# Patient Record
Sex: Female | Born: 1945 | Race: White | Hispanic: No | Smoking: Never smoker
Health system: Southern US, Community
[De-identification: ages and names within clinical notes are randomized; demographics above are authoritative.]

## PROBLEM LIST (undated history)

## (undated) DIAGNOSIS — J449 Chronic obstructive pulmonary disease, unspecified: Secondary | ICD-10-CM

## (undated) DIAGNOSIS — F419 Anxiety disorder, unspecified: Secondary | ICD-10-CM

## (undated) DIAGNOSIS — F32A Depression, unspecified: Secondary | ICD-10-CM

## (undated) DIAGNOSIS — I509 Heart failure, unspecified: Secondary | ICD-10-CM

## (undated) DIAGNOSIS — E669 Obesity, unspecified: Secondary | ICD-10-CM

## (undated) DIAGNOSIS — R269 Unspecified abnormalities of gait and mobility: Principal | ICD-10-CM

## (undated) DIAGNOSIS — F329 Major depressive disorder, single episode, unspecified: Secondary | ICD-10-CM

## (undated) DIAGNOSIS — S82891A Other fracture of right lower leg, initial encounter for closed fracture: Secondary | ICD-10-CM

## (undated) DIAGNOSIS — G629 Polyneuropathy, unspecified: Secondary | ICD-10-CM

## (undated) DIAGNOSIS — G63 Polyneuropathy in diseases classified elsewhere: Secondary | ICD-10-CM

## (undated) DIAGNOSIS — M1990 Unspecified osteoarthritis, unspecified site: Secondary | ICD-10-CM

## (undated) DIAGNOSIS — I10 Essential (primary) hypertension: Secondary | ICD-10-CM

## (undated) DIAGNOSIS — E785 Hyperlipidemia, unspecified: Secondary | ICD-10-CM

## (undated) DIAGNOSIS — G4730 Sleep apnea, unspecified: Secondary | ICD-10-CM

## (undated) DIAGNOSIS — S32010A Wedge compression fracture of first lumbar vertebra, initial encounter for closed fracture: Secondary | ICD-10-CM

## (undated) DIAGNOSIS — H269 Unspecified cataract: Secondary | ICD-10-CM

## (undated) DIAGNOSIS — K219 Gastro-esophageal reflux disease without esophagitis: Secondary | ICD-10-CM

## (undated) DIAGNOSIS — I739 Peripheral vascular disease, unspecified: Secondary | ICD-10-CM

## (undated) HISTORY — PX: HAND / FINGER LESION EXCISION: SUR531

## (undated) HISTORY — DX: Chronic obstructive pulmonary disease, unspecified: J44.9

## (undated) HISTORY — DX: Heart failure, unspecified: I50.9

## (undated) HISTORY — PX: ABDOMINAL HYSTERECTOMY: SHX81

## (undated) HISTORY — PX: BACK SURGERY: SHX140

## (undated) HISTORY — DX: Other fracture of right lower leg, initial encounter for closed fracture: S82.891A

## (undated) HISTORY — DX: Wedge compression fracture of first lumbar vertebra, initial encounter for closed fracture: S32.010A

## (undated) HISTORY — PX: UMBILICAL HERNIA REPAIR: SHX196

## (undated) HISTORY — DX: Sleep apnea, unspecified: G47.30

## (undated) HISTORY — DX: Unspecified abnormalities of gait and mobility: R26.9

## (undated) HISTORY — DX: Obesity, unspecified: E66.9

## (undated) HISTORY — DX: Polyneuropathy in diseases classified elsewhere: G63

## (undated) HISTORY — PX: TONSILLECTOMY: SUR1361

## (undated) HISTORY — PX: EXTENSOR TENDON OF FOREARM / WRIST REPAIR: SHX1547

## (undated) HISTORY — DX: Polyneuropathy, unspecified: G62.9

## (undated) HISTORY — DX: Hyperlipidemia, unspecified: E78.5

## (undated) HISTORY — PX: BLADDER SUSPENSION: SHX72

## (undated) HISTORY — PX: OTHER SURGICAL HISTORY: SHX169

## (undated) HISTORY — PX: CHOLECYSTECTOMY: SHX55

## (undated) HISTORY — PX: KYPHOPLASTY: SHX5884

## (undated) HISTORY — DX: Unspecified osteoarthritis, unspecified site: M19.90

## (undated) HISTORY — PX: CATARACT EXTRACTION: SUR2

---

## 2000-03-18 ENCOUNTER — Encounter: Payer: Self-pay | Admitting: Emergency Medicine

## 2000-03-18 ENCOUNTER — Emergency Department (HOSPITAL_COMMUNITY): Admission: EM | Admit: 2000-03-18 | Discharge: 2000-03-18 | Payer: Self-pay | Admitting: Emergency Medicine

## 2002-01-17 ENCOUNTER — Encounter: Payer: Self-pay | Admitting: *Deleted

## 2002-01-21 ENCOUNTER — Ambulatory Visit (HOSPITAL_COMMUNITY): Admission: RE | Admit: 2002-01-21 | Discharge: 2002-01-22 | Payer: Self-pay | Admitting: *Deleted

## 2002-01-21 ENCOUNTER — Encounter: Payer: Self-pay | Admitting: *Deleted

## 2002-01-21 ENCOUNTER — Encounter (INDEPENDENT_AMBULATORY_CARE_PROVIDER_SITE_OTHER): Payer: Self-pay | Admitting: *Deleted

## 2002-12-20 ENCOUNTER — Encounter: Admission: RE | Admit: 2002-12-20 | Discharge: 2002-12-20 | Payer: Self-pay | Admitting: Surgery

## 2002-12-20 ENCOUNTER — Encounter: Payer: Self-pay | Admitting: Surgery

## 2002-12-23 ENCOUNTER — Encounter (INDEPENDENT_AMBULATORY_CARE_PROVIDER_SITE_OTHER): Payer: Self-pay | Admitting: *Deleted

## 2002-12-23 ENCOUNTER — Ambulatory Visit (HOSPITAL_COMMUNITY): Admission: RE | Admit: 2002-12-23 | Discharge: 2002-12-23 | Payer: Self-pay | Admitting: Surgery

## 2002-12-23 ENCOUNTER — Ambulatory Visit (HOSPITAL_BASED_OUTPATIENT_CLINIC_OR_DEPARTMENT_OTHER): Admission: RE | Admit: 2002-12-23 | Discharge: 2002-12-23 | Payer: Self-pay | Admitting: Surgery

## 2006-01-03 ENCOUNTER — Encounter: Admission: RE | Admit: 2006-01-03 | Discharge: 2006-01-20 | Payer: Self-pay | Admitting: Neurology

## 2009-09-30 ENCOUNTER — Ambulatory Visit: Payer: Self-pay | Admitting: Surgery

## 2009-09-30 ENCOUNTER — Ambulatory Visit (HOSPITAL_COMMUNITY): Admission: RE | Admit: 2009-09-30 | Discharge: 2009-09-30 | Payer: Self-pay | Admitting: Internal Medicine

## 2010-01-29 ENCOUNTER — Ambulatory Visit: Payer: Self-pay | Admitting: Pulmonary Disease

## 2010-01-30 ENCOUNTER — Inpatient Hospital Stay (HOSPITAL_COMMUNITY): Admission: EM | Admit: 2010-01-30 | Discharge: 2010-02-02 | Payer: Self-pay | Admitting: Emergency Medicine

## 2010-02-01 DIAGNOSIS — F39 Unspecified mood [affective] disorder: Secondary | ICD-10-CM

## 2010-03-02 ENCOUNTER — Ambulatory Visit
Admission: RE | Admit: 2010-03-02 | Discharge: 2010-03-02 | Payer: Self-pay | Source: Home / Self Care | Attending: Orthopedic Surgery | Admitting: Orthopedic Surgery

## 2010-04-08 ENCOUNTER — Inpatient Hospital Stay (HOSPITAL_COMMUNITY)
Admission: EM | Admit: 2010-04-08 | Discharge: 2010-04-12 | Payer: Self-pay | Source: Home / Self Care | Attending: Orthopedic Surgery | Admitting: Orthopedic Surgery

## 2010-04-09 NOTE — Op Note (Signed)
[name], [name] NO.:  [id number]  MEDICAL RECORD NO.:  [id number]          PATIENT TYPE:  INP  LOCATION:  5022                         FACILITY:  [facility]  PHYSICIAN:  Madelynn Done, MD  DATE OF BIRTH:  07-06-1945  DATE OF PROCEDURE:  04/08/2010 DATE OF DISCHARGE:                              OPERATIVE REPORT   PREOPERATIVE DIAGNOSES: 1. Right forearm cat bite. 2. Right forearm abscess. 3. Right forearm extensor tenosynovitis.  POSTOPERATIVE DIAGNOSES: 1. Right forearm cat bite. 2. Right forearm abscess. 3. Right forearm extensor tenosynovitis.  ATTENDING PHYSICIAN:  Madelynn Done, MD, who scrubbed and present for the entire procedure.  ASSISTANT SURGEON:  None.  ANESTHESIA:  General via LMA.  TOURNIQUET TIME:  Zero minutes.  INTRAOPERATIVE CULTURES:  Aerobic and anaerobic cultures.  SURGICAL PROCEDURES: 1. Right forearm drainage of deep abscess and debridement of nonviable     tissue, skin muscle, fat and fascia. 2. Right forearm extensor fourth dorsal compartment tenosynovectomy. 3. Right forearm fasciotomy.  SURGICAL INDICATIONS:  [name] is a 65 year old right-hand-dominant female who was involved in a cat sustaining multiple bites over the dorsal aspect and volar aspect of her forearm, greater than 72 hours prior to her presentation in my office.  She was then seen at [facility] Emergency Room on April 06, 2010, where she underwent simple Betadine cleansing on the skin and placement of oral antibiotics per her account.  She presented to my office today with a largely swollen hand and forearm with obvious sign of infection.  It was recommended that she undergo the above procedure.  Risks, benefits and alternatives were discussed in detail with the patient and a signed informed consent was obtained.  Risks include but not limited to bleeding, infection, damage nearby nerves, arteries, tendons, loss of motion of elbow,  wrist and digits and need for further surgical intervention.  DESCRIPTION OF PROCEDURE:  The patient was appropriately identified in the preop holding area and a mark with a permanent marker was made on the right forearm to indicate correct operative site.  The patient was brought back to the operating room, placed supine on anesthesia room table where general anesthesia was administered.  The patient tolerated this well.  A well-padded tourniquet was then placed on the right brachium and sealed with a 1000 drape.  Right upper extremity was then prepped and draped in normal sterile fashion.  Time-out was called, correct site was identified.  Procedure was then begun.  Attention was then turned to the right forearm where the patient did have multiple puncture wounds extending on the dorsal aspect of the forearm.  A curvilinear incision was then made to corporate the multiple puncture wounds.  Dissection was then carried down through the skin and subcutaneous tissues where a gross purulence was countered.  Further dissection was then carried down to the fascial layer what there appeared to be fluid and reactive fluid within the fourth dorsal compartment.  The blunt dissection was carried down through the subcutaneous tissues.  Hemostasis was obtained with bipolar cautery. Following this, the forearm fasciotomy was then carried out  opening up the fourth dorsal compartment and also opening up the third and second dorsal compartment proximally.  After forearm fasciotomy, the fourth dorsal compartment tendons were inspected and tenosynovectomy was carried out of the inflammatory and reactive tissue with a fourth dorsal compartment.  Following tenosynovectomy, the wound was then thoroughly irrigated.  Nonviable tissue of the skin and subcutaneous tissues were then carried out over the dorsal aspect of the forearm.  Several small stab incisions were made, one on the volar and one on the dorsal  aspect of the hand to allow for the fluid to run through the entire dorsal aspect in the affected area.  Following this, a pulsatile lavage was then used over the wound.  After thorough irrigation, debridement was then carried out.  Following this, the wound was then reapproximated with 3-0 Prolene sutures and closed over small vessel loops.  Adaptic dressing and sterile compressive bandage were then applied.  The patient was then placed in a well-padded volar splint.  She was taken to recovery room in good condition.  POSTOPERATIVE PLAN:  The patient admitted for IV antibiotics and pain control.  She will be likely require repeat I and D within the next 48- 72 hours.  Await the wound cultures and follow her closely.     Madelynn Done, MD     FWO/MEDQ  D:  04/08/2010  T:  04/09/2010  Job:  [id number]  Electronically Signed by Bradly Bienenstock IV MD on 04/09/2010 11:59:37 AM

## 2010-04-12 LAB — MRSA PCR SCREENING: MRSA by PCR: NEGATIVE

## 2010-04-13 LAB — WOUND CULTURE: Gram Stain: NONE SEEN

## 2010-05-05 NOTE — H&P (Signed)
  [name], GUNDERMAN NO.:  [id number]  MEDICAL RECORD NO.:  [id number]          PATIENT TYPE:  INP  LOCATION:  5022                         FACILITY:  [facility]  PHYSICIAN:  Madelynn Done, MD  DATE OF BIRTH:  1945-12-19  DATE OF ADMISSION:  04/08/2010 DATE OF DISCHARGE:                             HISTORY & PHYSICAL   CHIEF COMPLAINT:  Right forearm pain.  BRIEF HISTORY:  [name] is a 64-year right-hand-dominant female who sustained multiple cat bites greater than 72 hours prior to presenting to my office on April 08, 2010.  The patient was seen at [facility] Emergency Department, underwent cleansing of the skin with Betadine and oral antibiotic placement.  She comes in today with a worsening infection in her right arm.  PAST MEDICAL HISTORY:  Signed and reviewed on the medical intake sheet in my office.  FAMILY HISTORY:  Signed and reviewed on the medical intake sheet in my office.  SOCIAL HISTORY:  Signed and reviewed on the medical intake sheet in my office.  ALLERGIES:  Signed and reviewed on the medical intake sheet in my office.  MEDICATIONS:  Signed and reviewed on the medical intake sheet in my office.  REVIEW OF SYSTEMS:  Signed and reviewed on the medical intake sheet in my office.  PHYSICAL EXAMINATION:  Documented on my office notes and please see my office notes for full details on the examination.  IMPRESSION:  Right forearm abscess and cat bite.  PLAN:  Today, the findings were reviewed with the patient and the patient will be admitted for operative intervention to undergo decompression of the forearm abscess.  Please see my office notes for the details.  She will be admitted for IV antibiotics and pain control. Continue to follow her closely.     Madelynn Done, MD     FWO/MEDQ  D:  04/09/2010  T:  04/10/2010  Job:  [id number]  Electronically Signed by Bradly Bienenstock IV MD on 05/05/2010 02:19:21 PM

## 2010-05-11 NOTE — H&P (Signed)
[name], [name]                ACCOUNT NO.:  [id number]  MEDICAL RECORD NO.:  [id number]          PATIENT TYPE:  INP  LOCATION:  1823                         FACILITY:  [facility]  PHYSICIAN:  Lucile Crater, MD         DATE OF BIRTH:  [birth year]  DATE OF ADMISSION:  01/29/2010 DATE OF DISCHARGE:                             HISTORY & PHYSICAL   CHIEF COMPLAINT:  Increasing lethargy.  HISTORY OF PRESENT ILLNESS:  The patient is a 65 year old female who has a history of chronic low back pain.  She is on hydrocodone and morphine for her back pain and she took a lot of medications to help with the pain.  This happened earlier this afternoon.  She does not remember how many pills she took.  There was no suicidal intention.  The reason was for pain relief.  The patient is very drowsy in the ER and could not give much of the history.  The patient is arousable to verbal commands, opens her eyes.  Moves all extremities.  She had an ABG done.  It revealed hypercarbia 66.  REVIEW OF SYSTEMS:  Could not be obtained secondary to mental status.  PAST MEDICAL HISTORY:  Unknown.  ALLERGIES:  LATEX.  HOME MEDICATIONS:  This is not a complete list. 1. Morphine. 2. Vicodin.  SOCIAL HISTORY:  Not obtainable.  FAMILY HISTORY:  Not obtainable.  PHYSICAL EXAM:  VITALS:  O2 saturation 93% on 2 liters.  Respiratory rate 16.  Pulse rate 95, blood pressure 115/69, temperature 99. GENERAL APPEARANCE:  Not in acute distress.  The patient is extremely lethargic, arousable to name calling. NECK:  No JVD lymphedema carotid bruit. CVS: Regular rhythm is normal.  No murmurs, rubs or gallops. LUNGS:  Clear to auscultation bilaterally. ABDOMEN:  Benign. EXTREMITIES:  No clubbing, cyanosis, edema. NEUROLOGIC:  Exam grossly nonfocal.  Accept increased drowsiness.  LABS AND STUDIES: 1. ABG:  PH 7.206, pCO2 of 66, pO2 of 72, bicarb 24. 2. Serum salicylate level less than 4. 3. Serum acetaminophen level less  than 10. 4. Sodium 139, potassium 4.6, chloride 104, bicarb 24, BUN 20,     creatinine 2.44, blood glucose 121. 5. Alcohol level less than 5. 6. Chest x-ray low lung volumes with vascular congestion. 7. WBC 16,800, hemoglobin 12.6, hematocrit 39, platelets 156,000. 8. Urine drug screen positive for opiates. 9. Urinalysis within normal limits.  ASSESSMENT/PLAN: 1. Altered mental status most likely secondary to excessive intake of     narcotic medications.  The patient reportedly did not have any     ideation to harm herself.  The intention was to relieve her pain.     We will obtain a CT scan of the head to evaluate for any acute     changes.  Serum alcohol, aminophylline, and aspirin levels are     within range.  She does have hypercarbia on ABG.  Most likely this     is secondary to narcotic abuse.  Will have her on BiPAP.  Will     closely monitor her respiratory status. 2. Acute renal failure.  Will aggressively  hydrate with IV fluids.  We     will repeat the BMP. 3. Leukocytosis, unclear etiology.  She is afebrile.  Urine is clear.     The chest x-ray is negative for pneumonia.  We will watch.  DISPOSITION:  Will admit the patient to step-down unit for closer monitoring.     Lucile Crater, MD     TA/MEDQ  D:  01/30/2010  T:  01/30/2010  Job:  [id number]  Electronically Signed by Lucile Crater MD on 05/11/2010 04:03:09 PM

## 2010-06-01 LAB — HEPATIC FUNCTION PANEL
ALT: 21 U/L (ref 0–35)
AST: 32 U/L (ref 0–37)
AST: 39 U/L — ABNORMAL HIGH (ref 0–37)
Albumin: 3.7 g/dL (ref 3.5–5.2)
Albumin: 4.4 g/dL (ref 3.5–5.2)
Total Bilirubin: 0.9 mg/dL (ref 0.3–1.2)
Total Bilirubin: 1 mg/dL (ref 0.3–1.2)
Total Protein: 7.5 g/dL (ref 6.0–8.3)

## 2010-06-01 LAB — STREP PNEUMONIAE URINARY ANTIGEN: Strep Pneumo Urinary Antigen: NEGATIVE

## 2010-06-01 LAB — POCT I-STAT 4, (NA,K, GLUC, HGB,HCT)
HCT: 36 % (ref 36.0–46.0)
Hemoglobin: 12.2 g/dL (ref 12.0–15.0)
Potassium: 3.6 mEq/L (ref 3.5–5.1)
Sodium: 140 mEq/L (ref 135–145)

## 2010-06-01 LAB — DIFFERENTIAL
Basophils Absolute: 0 10*3/uL (ref 0.0–0.1)
Basophils Absolute: 0 10*3/uL (ref 0.0–0.1)
Basophils Relative: 0 % (ref 0–1)
Eosinophils Absolute: 0 10*3/uL (ref 0.0–0.7)
Eosinophils Absolute: 0 10*3/uL (ref 0.0–0.7)
Eosinophils Relative: 0 % (ref 0–5)
Monocytes Absolute: 0.7 10*3/uL (ref 0.1–1.0)
Neutro Abs: 14.6 10*3/uL — ABNORMAL HIGH (ref 1.7–7.7)
Neutrophils Relative %: 87 % — ABNORMAL HIGH (ref 43–77)

## 2010-06-01 LAB — BASIC METABOLIC PANEL
BUN: 10 mg/dL (ref 6–23)
BUN: 22 mg/dL (ref 6–23)
BUN: 31 mg/dL — ABNORMAL HIGH (ref 6–23)
BUN: 7 mg/dL (ref 6–23)
CO2: 24 mEq/L (ref 19–32)
CO2: 25 mEq/L (ref 19–32)
Calcium: 8.2 mg/dL — ABNORMAL LOW (ref 8.4–10.5)
Calcium: 8.3 mg/dL — ABNORMAL LOW (ref 8.4–10.5)
Calcium: 9.1 mg/dL (ref 8.4–10.5)
Calcium: 9.2 mg/dL (ref 8.4–10.5)
Calcium: 9.4 mg/dL (ref 8.4–10.5)
Chloride: 107 mEq/L (ref 96–112)
Creatinine, Ser: 0.81 mg/dL (ref 0.4–1.2)
Creatinine, Ser: 0.89 mg/dL (ref 0.4–1.2)
Creatinine, Ser: 1.36 mg/dL — ABNORMAL HIGH (ref 0.4–1.2)
Creatinine, Ser: 2.44 mg/dL — ABNORMAL HIGH (ref 0.4–1.2)
GFR calc Af Amer: 24 mL/min — ABNORMAL LOW (ref >60)
GFR calc Af Amer: >60 mL/min (ref >60)
GFR calc Af Amer: >60 mL/min (ref >60)
GFR calc non Af Amer: 20 mL/min — ABNORMAL LOW (ref >60)
GFR calc non Af Amer: 39 mL/min — ABNORMAL LOW (ref >60)
GFR calc non Af Amer: >60 mL/min (ref >60)
Glucose, Bld: 116 mg/dL — ABNORMAL HIGH (ref 70–99)
Glucose, Bld: 121 mg/dL — ABNORMAL HIGH (ref 70–99)
Glucose, Bld: 80 mg/dL (ref 70–99)
Potassium: 3.7 mEq/L (ref 3.5–5.1)
Sodium: 139 mEq/L (ref 135–145)
Sodium: 141 mEq/L (ref 135–145)

## 2010-06-01 LAB — CK: Total CK: 219 U/L — ABNORMAL HIGH (ref 7–177)

## 2010-06-01 LAB — POCT I-STAT 3, ART BLOOD GAS (G3+)
Acid-base deficit: 4 mmol/L — ABNORMAL HIGH (ref 0.0–2.0)
Bicarbonate: 20.6 mEq/L (ref 20.0–24.0)
Bicarbonate: 26.5 mEq/L — ABNORMAL HIGH (ref 20.0–24.0)
Patient temperature: 98.6
Patient temperature: 99.9
TCO2: 28 mmol/L (ref 0–100)
pCO2 arterial: 66.7 mmHg (ref 35.0–45.0)
pH, Arterial: 7.206 — ABNORMAL LOW (ref 7.350–7.400)
pH, Arterial: 7.35 (ref 7.350–7.400)
pO2, Arterial: 72 mmHg — ABNORMAL LOW (ref 80.0–100.0)

## 2010-06-01 LAB — GLUCOSE, CAPILLARY: Glucose-Capillary: 122 mg/dL — ABNORMAL HIGH (ref 70–99)

## 2010-06-01 LAB — CBC
MCH: 29.2 pg (ref 26.0–34.0)
MCH: 29.3 pg (ref 26.0–34.0)
MCHC: 32.1 g/dL (ref 30.0–36.0)
MCV: 85.4 fL (ref 78.0–100.0)
MCV: 90.3 fL (ref 78.0–100.0)
Platelets: 103 10*3/uL — ABNORMAL LOW (ref 150–400)
Platelets: 156 10*3/uL (ref 150–400)
Platelets: 158 10*3/uL (ref 150–400)
RBC: 3.84 MIL/uL — ABNORMAL LOW (ref 3.87–5.11)
RDW: 14.1 % (ref 11.5–15.5)
RDW: 15.4 % (ref 11.5–15.5)
WBC: 6.3 10*3/uL (ref 4.0–10.5)

## 2010-06-01 LAB — CULTURE, BLOOD (ROUTINE X 2)
Culture  Setup Time: 201111121817
Culture: NO GROWTH

## 2010-06-01 LAB — RAPID URINE DRUG SCREEN, HOSP PERFORMED
Amphetamines: NOT DETECTED
Cocaine: NOT DETECTED
Opiates: POSITIVE — AB
Tetrahydrocannabinol: NOT DETECTED

## 2010-06-01 LAB — LIPID PANEL
LDL Cholesterol: 40 mg/dL (ref 0–99)
Total CHOL/HDL Ratio: 2.4 RATIO
Triglycerides: 188 mg/dL — ABNORMAL HIGH (ref <150)
VLDL: 38 mg/dL (ref 0–40)

## 2010-06-01 LAB — LEGIONELLA ANTIGEN, URINE: Legionella Antigen, Urine: NEGATIVE

## 2010-06-01 LAB — PROCALCITONIN: Procalcitonin: 2.52 ng/mL

## 2010-06-01 LAB — ACETAMINOPHEN LEVEL: Acetaminophen (Tylenol), Serum: <10 ug/mL — ABNORMAL LOW (ref 10–30)

## 2010-06-01 LAB — CARDIAC PANEL(CRET KIN+CKTOT+MB+TROPI)
CK, MB: 8.1 ng/mL (ref 0.3–4.0)
Relative Index: 1 (ref 0.0–2.5)
Troponin I: 0.05 ng/mL (ref 0.00–0.06)

## 2010-06-01 LAB — URINALYSIS, ROUTINE W REFLEX MICROSCOPIC
Bilirubin Urine: NEGATIVE
Hgb urine dipstick: NEGATIVE
Ketones, ur: NEGATIVE mg/dL
Nitrite: NEGATIVE
Specific Gravity, Urine: 1.025 (ref 1.005–1.030)
Urobilinogen, UA: 0.2 mg/dL (ref 0.0–1.0)

## 2010-06-01 LAB — CK TOTAL AND CKMB (NOT AT ARMC)
Relative Index: 1.4 (ref 0.0–2.5)
Total CK: 359 U/L — ABNORMAL HIGH (ref 7–177)

## 2010-06-01 LAB — SALICYLATE LEVEL: Salicylate Lvl: <4 mg/dL (ref 2.8–20.0)

## 2010-07-27 NOTE — Discharge Summary (Addendum)
  [name], SHERIFF NO.:  [id number]  MEDICAL RECORD NO.:  [id number]           PATIENT TYPE:  I  LOCATION:  5022                         FACILITY:  [facility]  PHYSICIAN:  Madelynn Done, MD  DATE OF BIRTH:  11/01/1945  DATE OF ADMISSION:  04/08/2010 DATE OF DISCHARGE:  04/12/2010                              DISCHARGE SUMMARY   ADMISSION DIAGNOSIS:  Right hand cat bite with infection.  DISCHARGE DIAGNOSIS:  Right hand cat bite with infection.  PROCEDURES AND DATES:  Right hand incision and drainage on the date of April 08, 2010.  DISCHARGE MEDICATIONS: 1. Augmentin 875 mg p.o. b.i.d. 2. Resume home medications and doses.  BRIEF HISTORY:  [name] is a right-hand-dominant female with a worsening cat bite and infection.  The patient was seen and evaluated in the office and based on a worsening infection taken to the operating room.  The patient was admitted to undergo the IV antibiotics and wound care.  HOSPITAL COURSE:  The patient was admitted to the orthopedic floor after the above procedure.  The patient tolerated this well.  Following this, the patient throughout her hospital course remained afebrile.  Vital signs were stable and normal, tolerating a regular diet.  On the day of discharge, felt ready discharged to be home.  The patient's all questions were answered and encouraged prior to the patient's discharge. The patient had close followup in the office.  The patient voiced understanding of the plan and the options.  CONDITION ON DISCHARGE:  Good.     Madelynn Done, MD     FWO/MEDQ  D:  07/13/2010  T:  07/14/2010  Job:  [id number]  Electronically Signed by Bradly Bienenstock IV MD on 07/29/2010 11:31:21 AM

## 2010-08-06 NOTE — Op Note (Signed)
NAME:  [name], [name]                           ACCOUNT NO.:  [id number]   MEDICAL RECORD NO.:  [id number]                   PATIENT TYPE:  OIB   LOCATION:  NA                                   FACILITY:  [facility]   PHYSICIAN:  Maisie Fus B. Samuella Cota, M.D.               DATE OF BIRTH:  April 13, 1945   DATE OF PROCEDURE:  01/21/2002  DATE OF DISCHARGE:                                 OPERATIVE REPORT   CCS# [id number]   PREOPERATIVE DIAGNOSES:  Chronic cholecystitis with cholelithiasis.   POSTOPERATIVE DIAGNOSES:  Chronic cholecystitis with cholelithiasis.   OPERATION PERFORMED:  Laparoscopic cholecystectomy with operative  cholangiogram.   SURGEON:  Maisie Fus B. Samuella Cota, M.D.   ASSISTANT:  Abigail Miyamoto, M.D.   ANESTHESIA:  General.   ANESTHESIOLOGIST:  Sheldon Silvan, M.D. and CRNA.   DESCRIPTION OF PROCEDURE:  The patient was taken to the operating room and  placed on the table in supine position.  After satisfactory general  anesthetic with intubation, the entire abdomen was prepped and draped as a  sterile field.  Small vertical infraumbilical was made through the skin and  subcutaneous tissues and midline fascia.  A finger was placed into the  peritoneal cavity and there were no adhesions to the anterior abdominal  wall.  A pursestring suture of 0 Vicryl was placed and the abdomen  insufflated to 14 mmHg pressure.  A second 10 mm trocar was placed just to  the right of midline in the subxiphoid area.  Two 5 mm trocars were placed  laterally.  The patient had adhesions to the entire gallbladder and the tip  of the gallbladder could barely be seen.  These adhesions were taken down  with gentle dissection using the cautery for dissection along with the  scissors.  The cystic duct was readily identified, the cystic artery was  also identified easily.  The cystic artery was superior to the duct and was  triply clipped on the remaining side, once on the gallbladder side and  divided.  The cystic  duct was clipped on the gallbladder side and a small  opening made into the cystic duct.  A cholangiocath was introduced through  the anterior abdominal wall in the right upper quadrant and the catheter was  placed into the cystic duct and held with an Endoclip.  Using real time C-  arm fluoroscopy, cholangiogram was carried out.  The cystic duct was quite  large.  There were no filling defects and the contrast spilled easily into  the duodenum.  The common duct was somewhat enlarged.  After the  cholangiogram had been reviewed, the cholangiocath was removed and the  cystic duct was triply clipped and divided.  The gallbladder was dissected  from the bed.  There was a second artery posteriorly which was triply  clipped and divided.  The gallbladder was dissected from the bed with  bleeding being controlled with  the cautery.  The gallbladder bed was  copiously irrigated and there appeared to be no bleeding.  Because of the  marked adhesions to the gallbladder with some evidence of bleeding  initially, a small piece of Surgicel was placed into the gallbladder.  The  gallbladder was placed in an EndoCatch bag and easily delivered through the  infraumbilical incision.  The right upper quadrant was copiously irrigated  with saline.  There was no evidence of bleeding or bile leak.  The patient  seemed to have a small hernia defect at the umbilicus.  The pursestring  suture was removed and three figure-of-eight sutures were placed vertically  to close the fascial defect at the umbilicus.  Inspection from inside  revealed there were no adhesions after the suturing had been done.  0.25%  Marcaine without epinephrine was injected at the infraumbilical incision.  The other sites had previously been injected.  The abdomen was deflated and  the two midline incisions were closed with running subcuticular sutures of 4-  0 Vicryl.  The two lateral trocar sites were closed with a single simple  subcuticular  4-0 Vicryl.  Benzoin and half inch Steri-Strips were used to  reinforce the skin closure.  Dry sterile dressings were applied.  The  patient seemed to tolerate the procedure well and was taken to the PACU in  satisfactory condition.                                                  Thomas B. Samuella Cota, M.D.    TBP/MEDQ  D:  01/21/2002  T:  01/21/2002  Job:  [id number]   cc:   Feliciana Rossetti

## 2010-08-06 NOTE — Op Note (Signed)
NAME:  [name], [name]                         ACCOUNT NO.:  [id number]   MEDICAL RECORD NO.:  [id number]                   PATIENT TYPE:  AMB   LOCATION:  DSC                                  FACILITY:  [facility]   PHYSICIAN:  Currie Paris, M.D.           DATE OF BIRTH:  [birth year]   DATE OF PROCEDURE:  12/23/2002  DATE OF DISCHARGE:                                 OPERATIVE REPORT   CCS# [id number]   PREOPERATIVE DIAGNOSIS:  Umbilical hernia.   POSTOPERATIVE DIAGNOSIS:  Umbilical hernia.   OPERATION PERFORMED:  Repair with mesh.   SURGEON:  Currie Paris, M.D.   ANESTHESIA:  General endotracheal.   INDICATIONS FOR PROCEDURE:  The patient is a 65 year old who has had  umbilical hernia develop.  She had prior laparoscopic cholecystectomy.   DESCRIPTION OF PROCEDURE:  The patient was seen in the holding area and had  no further questions.  She was taken to the operating room and after  satisfactory general anesthesia had been obtained, the abdomen was prepped  and draped.  The area around the umbilical area was infiltrated with some  Xylocaine plus Marcaine mixed and a curvilinear incision made.  Actually,  the patient initially had an LMA placed instead of an endotracheal tube and  we began work but the patient had significant respirations.  I was able to  free up the hernia sac, excise part of it and release the hernia but could  not get adequate visualization to do the repair because of breathing motion  and straining pushing.  Therefore, the patient's anesthesia was converted to  an endotracheal tube and this quieted the motion.   At that point I was then able to free up the fascia so that I had a good  view of it and cleaned up the subcutaneous tissue off of it so we could  place sutures and place mesh on top of it.   The repair was done with five sutures of 0 Prolene.  The two ends in the  middle were tied and left long and the other two cut after being tied.  There  was no tension.  A circular piece of mesh was overlaid and the three sutures  threaded through it and tied down.  It was then anchored at the periphery  with additional Prolene suture suturing it to the  fascia.  The incision was closed with some 3-0 Vicryl followed by 4-0  Monocryl subcuticular.  I used some mineral oil on a cotton ball to keep the  skin of the umbilicus pushed in  and sterile dressings applied.  The patient  tolerated the procedure well. There were no operative complications.  All  counts were correct.  Currie Paris, M.D.    CJS/MEDQ  D:  12/23/2002  T:  12/23/2002  Job:  [id number]   cc:   Feliciana Rossetti, MD  [address]  [place]  [place] [address]  Fax: [phone]

## 2010-12-29 ENCOUNTER — Other Ambulatory Visit (HOSPITAL_COMMUNITY): Payer: BC Managed Care – HMO

## 2010-12-30 ENCOUNTER — Other Ambulatory Visit (HOSPITAL_COMMUNITY): Payer: Self-pay | Admitting: Neurosurgery

## 2010-12-30 ENCOUNTER — Encounter (HOSPITAL_COMMUNITY)
Admission: RE | Admit: 2010-12-30 | Discharge: 2010-12-30 | Disposition: A | Discharge status: Home / Self Care | Payer: Medicare Other | Source: Ambulatory Visit | Attending: Neurosurgery | Admitting: Neurosurgery

## 2010-12-30 ENCOUNTER — Ambulatory Visit (HOSPITAL_COMMUNITY)
Admission: RE | Admit: 2010-12-30 | Discharge: 2010-12-30 | Disposition: A | Discharge status: Home / Self Care | Payer: Medicare Other | Source: Ambulatory Visit | Attending: Neurosurgery | Admitting: Neurosurgery

## 2010-12-30 DIAGNOSIS — S32000A Wedge compression fracture of unspecified lumbar vertebra, initial encounter for closed fracture: Secondary | ICD-10-CM

## 2010-12-30 DIAGNOSIS — Z01812 Encounter for preprocedural laboratory examination: Secondary | ICD-10-CM | POA: Insufficient documentation

## 2010-12-30 DIAGNOSIS — Z01818 Encounter for other preprocedural examination: Secondary | ICD-10-CM | POA: Insufficient documentation

## 2010-12-30 DIAGNOSIS — Z01810 Encounter for preprocedural cardiovascular examination: Secondary | ICD-10-CM | POA: Insufficient documentation

## 2010-12-30 DIAGNOSIS — I498 Other specified cardiac arrhythmias: Secondary | ICD-10-CM | POA: Insufficient documentation

## 2010-12-30 LAB — BASIC METABOLIC PANEL
Chloride: 101 mEq/L (ref 96–112)
GFR calc Af Amer: 84 mL/min — ABNORMAL LOW (ref >90)
GFR calc non Af Amer: 72 mL/min — ABNORMAL LOW (ref >90)
Glucose, Bld: 110 mg/dL — ABNORMAL HIGH (ref 70–99)
Potassium: 3.8 mEq/L (ref 3.5–5.1)
Sodium: 142 mEq/L (ref 135–145)

## 2010-12-30 LAB — CBC
HCT: 37.6 % (ref 36.0–46.0)
Hemoglobin: 12.7 g/dL (ref 12.0–15.0)
MCHC: 33.8 g/dL (ref 30.0–36.0)
RDW: 13.9 % (ref 11.5–15.5)
WBC: 6.8 10*3/uL (ref 4.0–10.5)

## 2010-12-30 LAB — SURGICAL PCR SCREEN: Staphylococcus aureus: POSITIVE — AB

## 2011-01-04 ENCOUNTER — Observation Stay (HOSPITAL_COMMUNITY)
Admission: RE | Admit: 2011-01-04 | Discharge: 2011-01-04 | Disposition: A | Discharge status: Home / Self Care | Payer: Medicare Other | Source: Ambulatory Visit | Attending: Neurosurgery | Admitting: Neurosurgery

## 2011-01-04 ENCOUNTER — Observation Stay (HOSPITAL_COMMUNITY): Payer: Medicare Other

## 2011-01-04 DIAGNOSIS — Z01812 Encounter for preprocedural laboratory examination: Secondary | ICD-10-CM | POA: Insufficient documentation

## 2011-01-04 DIAGNOSIS — Z01810 Encounter for preprocedural cardiovascular examination: Secondary | ICD-10-CM | POA: Insufficient documentation

## 2011-01-04 DIAGNOSIS — Z01818 Encounter for other preprocedural examination: Secondary | ICD-10-CM | POA: Insufficient documentation

## 2011-01-04 DIAGNOSIS — M8448XA Pathological fracture, other site, initial encounter for fracture: Principal | ICD-10-CM | POA: Insufficient documentation

## 2011-01-04 DIAGNOSIS — Z8673 Personal history of transient ischemic attack (TIA), and cerebral infarction without residual deficits: Secondary | ICD-10-CM | POA: Insufficient documentation

## 2011-01-04 DIAGNOSIS — I10 Essential (primary) hypertension: Secondary | ICD-10-CM | POA: Insufficient documentation

## 2011-01-04 DIAGNOSIS — K219 Gastro-esophageal reflux disease without esophagitis: Secondary | ICD-10-CM | POA: Insufficient documentation

## 2011-01-04 DIAGNOSIS — J45909 Unspecified asthma, uncomplicated: Secondary | ICD-10-CM | POA: Insufficient documentation

## 2011-01-18 NOTE — Op Note (Signed)
  NAMEEUGENIE, [name] NO.:  [id number]  MEDICAL RECORD NO.:  [id number]  LOCATION:  3535                         FACILITY:  [facility]  PHYSICIAN:  Danae Orleans. Venetia Maxon, M.D.  DATE OF BIRTH:  1945/10/04  DATE OF PROCEDURE:  01/04/2011 DATE OF DISCHARGE:  01/04/2011                              OPERATIVE REPORT   PREOPERATIVE DIAGNOSIS:  L1 compression fracture with bony retropulsion.  POSTOPERATIVE DIAGNOSIS:  L1 compression fracture with bony retropulsion.  PROCEDURE:  L1 kyphoplasty.  SURGEON:  Danae Orleans. Venetia Maxon, MD  ANESTHESIA:  General endotracheal anesthesia.  ESTIMATED BLOOD LOSS:  Minimal.  COMPLICATIONS:  None.  DISPOSITION:  To recovery.  INDICATIONS:  [name] is a 65 year old woman who fell and fractured L1 vertebra.  She has developed progressively worsening back pain and vertebral pain on plain radiographs.  It was elected to take her to surgery for kyphoplasty procedure for fractured vertebra.  PROCEDURE:  Ms. Bean was brought to the operating room.  Following satisfactory and uncomplicated induction of general endotracheal anesthesia plus intravenous lines, the patient was placed in prone position on chest and pelvic rolls.  Her back was prepped and draped in usual sterile fashion.  After placing C-arm fluoroscopy in lateral and AP planes overlying the L1 fractured vertebra, an extrapedicular approach was used from the right and the skin was infiltrated with local lidocaine.  Incision was made with a 15 blade.  The trocar was carried in the extrapedicular position into what appeared to be a fracture cleft within the vertebra.  A drill was used to enter the vertebra and subsequently the 15 mL inflatable bone tamp was inflated with good restoration of vertebral body height. Using the continuous delivery system, 5 mL of bone cement was placed within the fracture defect. There was no evidence of any posterior migration of bone cement.   The introducer was removed.  The skin was closed with 3-0 Vicryl suture and dressed with Dermabond.  The patient was extubated in the operating room and taken to the recovery room in stable and satisfactory condition having tolerated the operation well.  All counts were correct at the end of the case.     Danae Orleans. Venetia Maxon, M.D.     JDS/MEDQ  D:  01/04/2011  T:  01/05/2011  Job:  [id number]  Electronically Signed by Maeola Harman M.D. on 01/18/2011 07:49:18 AM

## 2011-03-14 ENCOUNTER — Inpatient Hospital Stay (HOSPITAL_COMMUNITY)
Admission: AD | Admit: 2011-03-14 | Discharge: 2011-03-17 | DRG: 563 | Disposition: A | Discharge status: Skilled Nursing Facility | Payer: Medicare Other | Source: Ambulatory Visit | Attending: Orthopedic Surgery | Admitting: Orthopedic Surgery

## 2011-03-14 ENCOUNTER — Encounter (HOSPITAL_COMMUNITY): Payer: Self-pay | Admitting: Orthopedic Surgery

## 2011-03-14 DIAGNOSIS — Y92009 Unspecified place in unspecified non-institutional (private) residence as the place of occurrence of the external cause: Secondary | ICD-10-CM

## 2011-03-14 DIAGNOSIS — F3289 Other specified depressive episodes: Secondary | ICD-10-CM | POA: Diagnosis present

## 2011-03-14 DIAGNOSIS — Z9889 Other specified postprocedural states: Secondary | ICD-10-CM

## 2011-03-14 DIAGNOSIS — S82899A Other fracture of unspecified lower leg, initial encounter for closed fracture: Secondary | ICD-10-CM | POA: Diagnosis present

## 2011-03-14 DIAGNOSIS — W07XXXA Fall from chair, initial encounter: Secondary | ICD-10-CM | POA: Diagnosis present

## 2011-03-14 DIAGNOSIS — R627 Adult failure to thrive: Secondary | ICD-10-CM | POA: Diagnosis present

## 2011-03-14 DIAGNOSIS — F329 Major depressive disorder, single episode, unspecified: Secondary | ICD-10-CM | POA: Diagnosis present

## 2011-03-14 HISTORY — DX: Major depressive disorder, single episode, unspecified: F32.9

## 2011-03-14 HISTORY — DX: Anxiety disorder, unspecified: F41.9

## 2011-03-14 HISTORY — DX: Gastro-esophageal reflux disease without esophagitis: K21.9

## 2011-03-14 HISTORY — DX: Essential (primary) hypertension: I10

## 2011-03-14 HISTORY — DX: Unspecified cataract: H26.9

## 2011-03-14 HISTORY — DX: Peripheral vascular disease, unspecified: I73.9

## 2011-03-14 HISTORY — DX: Unspecified osteoarthritis, unspecified site: M19.90

## 2011-03-14 HISTORY — DX: Depression, unspecified: F32.A

## 2011-03-14 LAB — COMPREHENSIVE METABOLIC PANEL
ALT: 17 U/L (ref 0–35)
AST: 21 U/L (ref 0–37)
CO2: 30 mEq/L (ref 19–32)
Calcium: 9.4 mg/dL (ref 8.4–10.5)
Chloride: 103 mEq/L (ref 96–112)
GFR calc non Af Amer: >90 mL/min (ref >90)
Sodium: 143 mEq/L (ref 135–145)
Total Bilirubin: 0.3 mg/dL (ref 0.3–1.2)

## 2011-03-14 LAB — CBC
Platelets: 139 10*3/uL — ABNORMAL LOW (ref 150–400)
RBC: 4.22 MIL/uL (ref 3.87–5.11)
WBC: 5.2 10*3/uL (ref 4.0–10.5)

## 2011-03-14 MED ORDER — BUDESONIDE-FORMOTEROL FUMARATE 160-4.5 MCG/ACT IN AERO
2.0000 | INHALATION_SPRAY | Freq: Two times a day (BID) | RESPIRATORY_TRACT | Status: DC
  Administered 2011-03-14 – 2011-03-17 (×4): 2 via RESPIRATORY_TRACT
  Filled 2011-03-14 (×2): qty 6

## 2011-03-14 MED ORDER — BIMATOPROST 0.01 % OP SOLN
1.0000 [drp] | Freq: Every day | OPHTHALMIC | Status: DC
  Filled 2011-03-14: qty 5

## 2011-03-14 MED ORDER — HYDROCODONE-ACETAMINOPHEN 5-325 MG PO TABS
1.0000 | ORAL_TABLET | ORAL | Status: DC | PRN
  Administered 2011-03-14 – 2011-03-17 (×11): 2 via ORAL
  Filled 2011-03-14 (×11): qty 2

## 2011-03-14 MED ORDER — PANTOPRAZOLE SODIUM 40 MG PO TBEC
40.0000 mg | DELAYED_RELEASE_TABLET | Freq: Every day | ORAL | Status: DC
  Administered 2011-03-14 – 2011-03-17 (×4): 40 mg via ORAL
  Filled 2011-03-14 (×5): qty 1

## 2011-03-14 MED ORDER — PREGABALIN 50 MG PO CAPS
100.0000 mg | ORAL_CAPSULE | Freq: Two times a day (BID) | ORAL | Status: DC
  Administered 2011-03-14 – 2011-03-17 (×6): 100 mg via ORAL
  Filled 2011-03-14 (×3): qty 2
  Filled 2011-03-14: qty 1
  Filled 2011-03-14 (×2): qty 2
  Filled 2011-03-14: qty 1

## 2011-03-14 MED ORDER — POTASSIUM CHLORIDE CRYS ER 10 MEQ PO TBCR
10.0000 meq | EXTENDED_RELEASE_TABLET | Freq: Every day | ORAL | Status: DC
  Administered 2011-03-16 – 2011-03-17 (×2): 10 meq via ORAL
  Filled 2011-03-14 (×3): qty 1

## 2011-03-14 MED ORDER — BRINZOLAMIDE 1 % OP SUSP
1.0000 [drp] | Freq: Two times a day (BID) | OPHTHALMIC | Status: DC
  Administered 2011-03-14 – 2011-03-17 (×6): 1 [drp] via OPHTHALMIC
  Filled 2011-03-14: qty 10

## 2011-03-14 MED ORDER — POTASSIUM CHLORIDE CRYS ER 20 MEQ PO TBCR
40.0000 meq | EXTENDED_RELEASE_TABLET | Freq: Four times a day (QID) | ORAL | Status: AC
  Administered 2011-03-14 – 2011-03-15 (×3): 40 meq via ORAL
  Filled 2011-03-14 (×3): qty 2

## 2011-03-14 MED ORDER — METHOCARBAMOL 500 MG PO TABS
500.0000 mg | ORAL_TABLET | Freq: Four times a day (QID) | ORAL | Status: DC | PRN
  Administered 2011-03-17: 500 mg via ORAL
  Filled 2011-03-14: qty 1

## 2011-03-14 MED ORDER — BIMATOPROST 0.03 % OP SOLN
1.0000 [drp] | Freq: Every day | OPHTHALMIC | Status: DC
  Administered 2011-03-14 – 2011-03-16 (×3): 1 [drp] via OPHTHALMIC
  Filled 2011-03-14: qty 2.5

## 2011-03-14 MED ORDER — SIMVASTATIN 20 MG PO TABS
20.0000 mg | ORAL_TABLET | Freq: Every day | ORAL | Status: DC
  Administered 2011-03-14 – 2011-03-17 (×4): 20 mg via ORAL
  Filled 2011-03-14 (×5): qty 1

## 2011-03-14 MED ORDER — TRAZODONE HCL 50 MG PO TABS
50.0000 mg | ORAL_TABLET | Freq: Every day | ORAL | Status: DC
  Administered 2011-03-14 – 2011-03-16 (×3): 50 mg via ORAL
  Filled 2011-03-14 (×5): qty 1

## 2011-03-14 MED ORDER — BACLOFEN 10 MG PO TABS
10.0000 mg | ORAL_TABLET | Freq: Every day | ORAL | Status: DC
  Administered 2011-03-14 – 2011-03-16 (×3): 10 mg via ORAL
  Filled 2011-03-14 (×5): qty 1

## 2011-03-14 MED ORDER — FLUTICASONE PROPIONATE 50 MCG/ACT NA SUSP
1.0000 | Freq: Two times a day (BID) | NASAL | Status: DC | PRN
  Administered 2011-03-14 – 2011-03-17 (×3): 1 via NASAL
  Filled 2011-03-14: qty 16

## 2011-03-14 MED ORDER — HYDROCODONE-ACETAMINOPHEN 5-325 MG PO TABS: 1.0000 | ORAL_TABLET | ORAL | Status: DC | PRN

## 2011-03-14 MED ORDER — DEXTROSE 5 % IV SOLN
500.0000 mg | Freq: Four times a day (QID) | INTRAVENOUS | Status: DC | PRN
  Filled 2011-03-14: qty 5

## 2011-03-14 NOTE — Progress Notes (Signed)
03/14/11 2030 Alphonsa Overall PA called re patient request for home meds to be ordered. Order received for meds. Eustace Moore RN  03/14/11 2115  Brad Dixon Pa called re K 2.8 orders received for oral potassium Eustace Moore Rn

## 2011-03-14 NOTE — Progress Notes (Signed)
Contact numbers  Brother cell [phone], home [phone]

## 2011-03-14 NOTE — H&P (Signed)
[name] is an 65 y.o. female.   Chief Complaint: " I am unable to care for myself at home" HPI: 65 yo female presents unable to care for herself at home after breaking her ankle last week.  She had back surgery(Dr Venetia Maxon) 6 weeks ago and was not fully over that when she fell from a chair injuring her left ankle.  She was seen as an outpatient this past week and diagnosed with an ankle fracture and treated with a CAM walker.  She was instructed to elevate her foot and leg and to place as little weight on the leg as possible.  She thought initially that she could make it on her own however she called this morning ans said she cannot.   Past Medical History: Depression Cataracts Glaucoma Recent Back Surgery High Cholesterol Reflux  Denies: heart disease, diabetes, cancer, thyroid disease  Past Surgical HIstory: Lumbar Spine Surgery - Dr Maeola Harman 2012 Bladder Tack R UE I+D - Ortmann L knee scope  No family history on file.  Social History: lives alone in one level house, 2 steps to get in [place](POA) : cp [phone], hp [phone] Denies tobacco use Social EtOH (wine)  Allergies: PCN, Sulfa, Neosporin  Medications Hydrocodone prn Tarvastatin Trazadone Simbacort Dexilant Lyrica Azopt Lumigan Protonix Lamictal Prozac Sular Zyrtec  No results found for this or any previous visit (from the past 48 hour(s)). No results found.  ROS  There were no vitals taken for this visit. Physical Exam   WDWNF in NAD Heent: NCAT, EOMI Chest: normal chest rise Heart : regular Abd: soft Bilateral UE's normal pain-free ROM Right LE: edema and stasis changes, otherwise normal L LE: edema, stasis changes, decreased ankle ROM due to pain, NVI  Assessment/Plan R ankle fracture Failure to thrive - unable to care for self at home High Fall Risk Social Admission for SNF planning, will need FL-2 on chart DVT prophylaxis Need med list and  reconciliation  [name],STEVEN R 03/14/2011, 1:51 PM

## 2011-03-15 MED ORDER — LAMOTRIGINE 150 MG PO TABS
150.0000 mg | ORAL_TABLET | Freq: Every day | ORAL | Status: DC
  Administered 2011-03-15 – 2011-03-16 (×2): 150 mg via ORAL
  Filled 2011-03-15 (×3): qty 1

## 2011-03-15 MED ORDER — FLUOXETINE HCL 20 MG PO CAPS
40.0000 mg | ORAL_CAPSULE | Freq: Every day | ORAL | Status: AC
  Administered 2011-03-15 – 2011-03-17 (×3): 40 mg via ORAL
  Filled 2011-03-15 (×4): qty 2

## 2011-03-15 MED ORDER — LAMOTRIGINE 25 MG PO TABS
75.0000 mg | ORAL_TABLET | Freq: Every day | ORAL | Status: AC
  Administered 2011-03-15 – 2011-03-17 (×3): 75 mg via ORAL
  Filled 2011-03-15 (×3): qty 3

## 2011-03-15 MED ORDER — CLONAZEPAM 0.125 MG PO TBDP
0.2500 mg | ORAL_TABLET | Freq: Two times a day (BID) | ORAL | Status: DC
  Administered 2011-03-15 – 2011-03-17 (×5): 0.25 mg via ORAL
  Filled 2011-03-15 (×2): qty 1
  Filled 2011-03-15 (×4): qty 2

## 2011-03-15 MED ORDER — NISOLDIPINE ER 17 MG PO TB24
17.0000 mg | ORAL_TABLET | Freq: Every day | ORAL | Status: AC
  Administered 2011-03-15 – 2011-03-17 (×3): 17 mg via ORAL
  Filled 2011-03-15 (×3): qty 1

## 2011-03-15 NOTE — Progress Notes (Signed)
Subjective: comfortable   Objective: Vital signs in last 24 hours: Temp:  [97.9 F (36.6 C)-98.7 F (37.1 C)] 97.9 F (36.6 C) (12/25 0547) Pulse Rate:  [53-63] 53  (12/25 0547) Resp:  [16-18] 16  (12/25 0547) BP: (137-155)/(72-75) 155/74 mmHg (12/25 0547) SpO2:  [92 %-97 %] 94 % (12/25 0547) Weight:  [81.647 kg (180 lb)] 180 lb (81.647 kg) (12/24 1315)  Intake/Output from previous day: 12/24 0701 - 12/25 0700 In: 360 [P.O.:360] Out: -  Intake/Output this shift: Total I/O In: 240 [P.O.:240] Out: -    Basename 03/14/11 1506  HGB 12.0    Basename 03/14/11 1506  WBC 5.2  RBC 4.22  HCT 36.7  PLT 139*    Basename 03/14/11 1506  NA 143  K 2.8*  CL 103  CO2 30  BUN 11  CREATININE 0.62  GLUCOSE 112*  CALCIUM 9.4   No results found for this basename: LABPT:2,INR:2 in the last 72 hours  No change neurovascular status  Passive and active ROM toes no discomfort   Assessment/Plan: Discharge planning  Hypokalemia - Will give K Check lytes in am   Janazia Schreier A 03/15/2011, 9:27 AM

## 2011-03-16 LAB — MRSA PCR SCREENING: MRSA by PCR: POSITIVE — AB

## 2011-03-16 LAB — ELECTROLYTE PANEL
CO2: 27 mEq/L (ref 19–32)
Potassium: 3.7 mEq/L (ref 3.5–5.1)
Sodium: 139 mEq/L (ref 135–145)

## 2011-03-16 MED ORDER — POLYETHYLENE GLYCOL 3350 17 G PO PACK
17.0000 g | PACK | Freq: Every day | ORAL | Status: DC
  Administered 2011-03-16 – 2011-03-17 (×2): 17 g via ORAL
  Filled 2011-03-16 (×4): qty 1

## 2011-03-16 MED ORDER — MUPIROCIN 2 % EX OINT
1.0000 "application " | TOPICAL_OINTMENT | Freq: Two times a day (BID) | CUTANEOUS | Status: DC
  Administered 2011-03-16 – 2011-03-17 (×2): 1 via NASAL
  Filled 2011-03-16: qty 22

## 2011-03-16 MED ORDER — POLYETHYLENE GLYCOL 3350 17 G PO PACK: 17.0000 g | PACK | Freq: Every day | ORAL | Status: AC

## 2011-03-16 NOTE — Progress Notes (Signed)
CSW met with pt today to assist with D/C planning. It is unclear, at this point, if pt will be able to use her medicare for SNF payment. UR RNCM will alert CSW once she knows staus ( inpt vs obs ). Pt states she has no pvt funds for placement if medicare won't cover cost of placement. Unclear if pt will qualify for medicaid or Letter of Guarantee from [facility]. Will investigate all possiblilities for assistance with SNF placement. Will initiate SNF search once payment status is determined.

## 2011-03-16 NOTE — Progress Notes (Signed)
Occupational Therapy Evaluation Patient Details Name: [name] MRN: [id number] DOB: [birth year] Today's Date: 03/16/2011 EV2 [id number] Problem List:  Patient Active Problem List  Diagnoses  . Ankle fracture    Past Medical History:  Past Medical History  Diagnosis Date  . GERD (gastroesophageal reflux disease)   . Peripheral vascular disease   . Cataracts, bilateral   . Glaucoma   . Asthma   . Depression   . Hypertension   . Arthritis   . Anxiety    Past Surgical History:  Past Surgical History  Procedure Date  . Back surgery   . Bladder suspension   . Knee arhtroscopy     OT Assessment/Plan/Recommendation OT Assessment Clinical Impression Statement: Pt presents as a high fall risk if she were to return home alone. States she has fallen several times recently & can no longer successfully care for herself. Skilled OT recommended to maximize I with ADLS for d/c to next venue of care. OT Recommendation/Assessment: Patient will need skilled OT in the acute care venue OT Problem List: Decreased activity tolerance;Decreased knowledge of use of DME or AE Barriers to Discharge: Inaccessible home environment;Decreased caregiver support OT Plan OT Frequency: Min 2X/week OT Treatment/Interventions: Self-care/ADL training;DME and/or AE instruction;Therapeutic activities;Patient/family education OT Recommendation Follow Up Recommendations: Skilled nursing facility Equipment Recommended: Defer to next venue Individuals Consulted Consulted and Agree with Results and Recommendations: Patient OT Goals Acute Rehab OT Goals OT Goal Formulation: With patient Time For Goal Achievement: 2 weeks ADL Goals Pt Will Perform Lower Body Bathing: with supervision;Sit to stand from chair;Sit to stand from bed ADL Goal: Lower Body Bathing - Progress: Progressing toward goals Pt Will Perform Lower Body Dressing: with supervision;Sit to stand from bed;Sit to stand from chair ADL Goal: Lower  Body Dressing - Progress: Progressing toward goals Pt Will Transfer to Toilet: with supervision;3-in-1;Ambulation ADL Goal: Toilet Transfer - Progress: Progressing toward goals Pt Will Perform Toileting - Clothing Manipulation: with supervision;Standing ADL Goal: Toileting - Clothing Manipulation - Progress: Progressing toward goals Pt Will Perform Toileting - Hygiene: with supervision;Sit to stand from 3-in-1/toilet ADL Goal: Toileting - Hygiene - Progress: Progressing toward goals  OT Evaluation Precautions/Restrictions  Precautions Precautions: Fall Required Braces or Orthoses: Yes (LAFO) Restrictions Weight Bearing Restrictions: Yes LLE Weight Bearing: Non weight bearing (RN requesting clarification on NWB with cam walker) Prior Functioning Home Living Lives With: Alone Receives Help From: Other (Comment) (has housekeeper once every 2 weeks.) Type of Home: House Home Layout: Two level;Able to live on main level with bedroom/bathroom;Full bath on main level Home Access: Stairs to enter Entrance Stairs-Rails: None Entrance Stairs-Number of Steps: 2 Bathroom Shower/Tub: Forensic scientist: Standard Bathroom Accessibility: No Home Adaptive Equipment: Walker - rolling Prior Function Level of Independence: Independent with basic ADLs;Requires assistive device for independence;Needs assistance with homemaking Meal Prep: Maximal Light Housekeeping: Maximal Able to Take Stairs?: Yes Driving: Yes Vocation: Retired ADL ADL Grooming: Performed;Wash/dry hands Where Assessed - Grooming: Standing at sink Upper Body Bathing: Simulated;Set up Where Assessed - Upper Body Bathing: Unsupported;Sitting, bed Lower Body Bathing: Simulated;Maximal assistance Where Assessed - Lower Body Bathing: Sit to stand from bed Upper Body Dressing: Simulated;Set up Where Assessed - Upper Body Dressing: Sitting, bed;Unsupported Lower Body Dressing: Simulated;Maximal  assistance Where Assessed - Lower Body Dressing: Sit to stand from bed Toilet Transfer: Performed;Minimal assistance Toilet Transfer Method: Ambulating Toilet Transfer Equipment: Raised toilet seat with arms (or 3-in-1 over toilet) Toileting - Clothing Manipulation: Performed;Moderate assistance Where Assessed - Toileting  Clothing Manipulation: Sit to stand from 3-in-1 or toilet Toileting - Hygiene: Performed;Minimal assistance Where Assessed - Toileting Hygiene: Sit to stand from 3-in-1 or toilet Tub/Shower Transfer: Not assessed Tub/Shower Transfer Method: Other (comment) (Pt states she is afraid to get into tub due to past falls.) Equipment Used: Rolling walker ADL Comments: Pt fatigues quickly. Very nervous & shaky throughout eval. Very fearful of falling. Vision/Perception  Vision - History Baseline Vision: Wears glasses all the time Visual History: Cataracts Patient Visual Report: No change from baseline Vision - Assessment Vision Assessment: Vision not tested Cognition Cognition Arousal/Alertness: Awake/alert Overall Cognitive Status: Appears within functional limits for tasks assessed Sensation/Coordination Coordination Gross Motor Movements are Fluid and Coordinated: Yes Extremity Assessment RUE Assessment RUE Assessment: Within Functional Limits LUE Assessment LUE Assessment: Within Functional Limits Mobility  Bed Mobility Bed Mobility: Yes Supine to Sit: 4: Min assist;HOB flat;With rails Sit to Supine - Left: 4: Min assist;HOB flat;With rail Transfers Transfers: Yes Sit to Stand: 4: Min assist;From elevated surface;With upper extremity assist;From chair/3-in-1;From bed Sit to Stand Details (indicate cue type and reason): Cues for use of UEs and LE position Stand to Sit: 4: Min assist;With upper extremity assist;With armrests;To chair/3-in-1;To bed Stand to Sit Details: cues for hand placement. Exercises   End of Session OT - End of Session Equipment Utilized  During Treatment: Gait belt;Other (comment) (RW) Activity Tolerance: Patient tolerated treatment well Patient left: in bed;with call bell in reach General Behavior During Session: [facility] for tasks performed Cognition: [facility] for tasks performed   Loeta Herst A [phone] 03/16/2011, 1:41 PM

## 2011-03-16 NOTE — Progress Notes (Signed)
Physical Therapy Evaluation Patient Details Name: [name] MRN: [id number] DOB: 06/06/1945 Today's Date: 03/16/2011 0945 - 1008; EVAL Problem List:  Patient Active Problem List  Diagnoses  . Ankle fracture    Past Medical History:  Past Medical History  Diagnosis Date  . GERD (gastroesophageal reflux disease)   . Peripheral vascular disease   . Cataracts, bilateral   . Glaucoma   . Asthma   . Depression   . Hypertension   . Arthritis   . Anxiety    Past Surgical History:  Past Surgical History  Procedure Date  . Back surgery   . Bladder suspension   . Knee arhtroscopy     PT Assessment/Plan/Recommendation PT Assessment Clinical Impression Statement: Pt with recent ankle fx and NWB status presents with limitations with functional mobility and inability to perform basic ADL for self care at home.  Pt would benefit from skilled PT intervention to maximize IND for follow up rehab at SNF level and eventual return home PT Recommendation/Assessment: Patient will need skilled PT in the acute care venue PT Problem List: Decreased strength;Decreased activity tolerance;Decreased knowledge of use of DME;Decreased knowledge of precautions;Obesity PT Therapy Diagnosis : Difficulty walking PT Plan PT Frequency: Min 5X/week PT Treatment/Interventions: DME instruction;Gait training;Stair training;Functional mobility training;Therapeutic activities;Therapeutic exercise;Patient/family education PT Recommendation Recommendations for Other Services: OT consult Follow Up Recommendations: Skilled nursing facility Equipment Recommended: Defer to next venue PT Goals  Acute Rehab PT Goals PT Goal Formulation: With patient Time For Goal Achievement: 7 days Pt will go Supine/Side to Sit: with supervision PT Goal: Supine/Side to Sit - Progress: Not met Pt will go Sit to Supine/Side: with supervision PT Goal: Sit to Supine/Side - Progress: Not met Pt will go Sit to Stand: with  supervision PT Goal: Sit to Stand - Progress: Not met Pt will go Stand to Sit: with supervision PT Goal: Stand to Sit - Progress: Not met Pt will Ambulate: 16 - 50 feet;with min assist;with rolling walker PT Goal: Ambulate - Progress: Not met  PT Evaluation Precautions/Restrictions  Precautions Precautions: Fall Required Braces or Orthoses: Yes (LAFO) Restrictions Weight Bearing Restrictions: Yes LLE Weight Bearing: Non weight bearing (RN requesting clarification on NWB with cam walker) Prior Functioning  Home Living Lives With: Alone Receives Help From: Other (Comment) (has housekeeper once every 2 weeks.) Type of Home: House Home Layout: Two level;Able to live on main level with bedroom/bathroom;Full bath on main level Home Access: Stairs to enter Entrance Stairs-Rails: None Entrance Stairs-Number of Steps: 2 Bathroom Shower/Tub: Forensic scientist: Standard Bathroom Accessibility: No Home Adaptive Equipment: Walker - rolling Prior Function Level of Independence: Independent with basic ADLs;Requires assistive device for independence;Needs assistance with homemaking Meal Prep: Maximal Light Housekeeping: Maximal Able to Take Stairs?: Yes Driving: Yes Vocation: Retired Financial risk analyst Arousal/Alertness: Awake/alert Overall Cognitive Status: Appears within functional limits for tasks assessed Sensation/Coordination Coordination Gross Motor Movements are Fluid and Coordinated: Yes Extremity Assessment RUE Assessment RUE Assessment: Within Functional Limits LUE Assessment LUE Assessment: Within Functional Limits RLE Assessment RLE Assessment: Within Functional Limits LLE Assessment LLE Assessment: Exceptions to [facility] (Ankle immobilized with cam walker) Mobility (including Balance) Bed Mobility Bed Mobility: Yes Supine to Sit: 5: Supervision;4: Min assist Transfers Transfers: Yes Sit to Stand: 4: Min assist;5: Supervision;With upper extremity  assist;From bed;From chair/3-in-1;With armrests Sit to Stand Details (indicate cue type and reason): Cues for use of UEs and LE position Stand to Sit: 5: Supervision;4: Min assist;With upper extremity assist;To chair/3-in-1 Stand to Sit  Details: Cues for use of UEs and LE position Ambulation/Gait Ambulation/Gait: Yes Ambulation/Gait Assistance: 4: Min assist Ambulation/Gait Assistance Details (indicate cue type and reason): cues for posture, position from RW, sequence, foot placement and NWB status - pt unable to comply with NWB  Ambulation Distance (Feet): 14 Feet Assistive device: Rolling walker Gait Pattern: Step-to pattern    Exercise    End of Session PT - End of Session Equipment Utilized During Treatment: Gait belt Activity Tolerance: Patient limited by fatigue (And inability to comply with NWB status) Patient left: in chair;with call bell in reach Nurse Communication: Mobility status for transfers;Mobility status for ambulation General Behavior During Session: [facility] for tasks performed Cognition: [facility] for tasks performed  Dustin Bumbaugh 03/16/2011, 1:09 PM  2

## 2011-03-16 NOTE — Progress Notes (Signed)
Orthopedics Progress Note  Subjective: Pt resting comfortably with minimal pain to left ankle today. Pt c/o mild constipation and we discussed changing her lyrica dosage. We are awaiting SNF placement  Objective:  Filed Vitals:   03/16/11 0300  BP: 146/67  Pulse: 48  Temp: 97.6 F (36.4 C)  Resp: 16    General: Awake and alert  Musculoskeletal: left lower extremity in cam walker, nv intact distally Neurovascularly intact  Lab Results  Component Value Date   WBC 5.2 03/14/2011   HGB 12.0 03/14/2011   HCT 36.7 03/14/2011   MCV 87.0 03/14/2011   PLT 139* 03/14/2011       Component Value Date/Time   NA 139 03/16/2011 0405   K 3.7 03/16/2011 0405   CL 104 03/16/2011 0405   CO2 27 03/16/2011 0405   GLUCOSE 112* 03/14/2011 1506   BUN 11 03/14/2011 1506   CREATININE 0.62 03/14/2011 1506   CALCIUM 9.4 03/14/2011 1506   GFRNONAA >90 03/14/2011 1506   GFRAA >90 03/14/2011 1506    Lab Results  Component Value Date   INR 1.12 01/30/2010    Assessment/Plan: Left ankle fx with inability to ambulate   Plan: work on SNF placement.  Pt may be discharged once bed available  Almedia Balls. Ranell Patrick, MD 03/16/2011 8:08 AM

## 2011-03-16 NOTE — Discharge Summary (Signed)
Physician Discharge Summary  Patient ID: [name] MRN: [id number] DOB/AGE: 67-Apr-1947 67 y.o.  Admit date: 03/14/2011 Discharge date: 03/16/2011  Admission Diagnoses:  Principal Problem:  *Ankle fracture   Discharge Diagnoses:  Same   Surgeries:  on * No surgery found *   Consultants: PT   Discharged Condition: Stable  Hospital Course: [name] is an 65 y.o. female who was admitted 03/14/2011 with a chief complaint of No chief complaint on file. , and found to have a diagnosis of Ankle fracture.  They were brought to the operating room on * No surgery found * and underwent the above named procedures.    The patient had an uncomplicated hospital course and was stable for discharge.  Recent vital signs:  Filed Vitals:   03/16/11 0300  BP: 146/67  Pulse: 48  Temp: 97.6 F (36.4 C)  Resp: 16    Recent laboratory studies:  Results for orders placed during the hospital encounter of 03/14/11  CBC      Component Value Range   WBC 5.2  4.0 - 10.5 (K/uL)   RBC 4.22  3.87 - 5.11 (MIL/uL)   Hemoglobin 12.0  12.0 - 15.0 (g/dL)   HCT 40.9  81.1 - 91.4 (%)   MCV 87.0  78.0 - 100.0 (fL)   MCH 28.4  26.0 - 34.0 (pg)   MCHC 32.7  30.0 - 36.0 (g/dL)   RDW 78.2  95.6 - 21.3 (%)   Platelets 139 (*) 150 - 400 (K/uL)  COMPREHENSIVE METABOLIC PANEL      Component Value Range   Sodium 143  135 - 145 (mEq/L)   Potassium 2.8 (*) 3.5 - 5.1 (mEq/L)   Chloride 103  96 - 112 (mEq/L)   CO2 30  19 - 32 (mEq/L)   Glucose, Bld 112 (*) 70 - 99 (mg/dL)   BUN 11  6 - 23 (mg/dL)   Creatinine, Ser 0.86  0.50 - 1.10 (mg/dL)   Calcium 9.4  8.4 - 57.8 (mg/dL)   Total Protein 7.3  6.0 - 8.3 (g/dL)   Albumin 3.9  3.5 - 5.2 (g/dL)   AST 21  0 - 37 (U/L)   ALT 17  0 - 35 (U/L)   Alkaline Phosphatase 125 (*) 39 - 117 (U/L)   Total Bilirubin 0.3  0.3 - 1.2 (mg/dL)   GFR calc non Af Amer >90  >90 (mL/min)   GFR calc Af Amer >90  >90 (mL/min)  ELECTROLYTE PANEL      Component Value Range    Sodium 139  135 - 145 (mEq/L)   Potassium 3.7  3.5 - 5.1 (mEq/L)   Chloride 104  96 - 112 (mEq/L)   CO2 27  19 - 32 (mEq/L)    Discharge Medications:   Current Discharge Medication List    START taking these medications   Details  polyethylene glycol (MIRALAX / GLYCOLAX) packet Take 17 g by mouth daily. Qty: 14 each, Refills: 0      CONTINUE these medications which have NOT CHANGED   Details  alendronate (FOSAMAX) 70 MG tablet Take 70 mg by mouth every 7 (seven) days. Take with a full glass of water on an empty stomach. PT TAKES ON Thursday.     atorvastatin (LIPITOR) 20 MG tablet Take 20 mg by mouth daily.      baclofen (LIORESAL) 10 MG tablet Take 10 mg by mouth at bedtime.      bimatoprost (LUMIGAN) 0.03 % ophthalmic solution Place  1 drop into both eyes at bedtime.      brinzolamide (AZOPT) 1 % ophthalmic suspension Place 1 drop into both eyes 2 (two) times daily.      budesonide-formoterol (SYMBICORT) 160-4.5 MCG/ACT inhaler Inhale 2 puffs into the lungs 2 (two) times daily.      bumetanide (BUMEX) 2 MG tablet Take 2 mg by mouth daily.      clonazePAM (KLONOPIN) 0.25 MG disintegrating tablet Take 0.25 mg by mouth 2 (two) times daily as needed. TREMORS     dexlansoprazole (DEXILANT) 60 MG capsule Take 60 mg by mouth daily.      FLUoxetine (PROZAC) 20 MG tablet Take 40 mg by mouth every morning. PT TAKES 2 CAPS FOR 40MG  DOSAGE     fluticasone (FLONASE) 50 MCG/ACT nasal spray Place 2 sprays into the nose daily.      HYDROcodone-acetaminophen (NORCO) 5-325 MG per tablet Take 1 tablet by mouth every 4 (four) hours as needed.      lamoTRIgine (LAMICTAL) 150 MG tablet Take 75-150 mg by mouth 2 (two) times daily. PT TAKES 1/2 TAB EVERY MORNING FOR 75MG  DOSE. AT BEDTIME PT TAKES 1 TABLET FOR 150MG  DOSE     levalbuterol (XOPENEX HFA) 45 MCG/ACT inhaler Inhale 1-2 puffs into the lungs every 4 (four) hours as needed.      lidocaine (LIDODERM) 5 % Place 1 patch onto the skin  daily. Remove & Discard patch within 12 hours or as directed by MD     methocarbamol (ROBAXIN) 500 MG tablet Take 500 mg by mouth 4 (four) times daily. SPASMS     nisoldipine (SULAR) 17 MG 24 hr tablet Take 17 mg by mouth every morning.      oxyCODONE-acetaminophen (PERCOCET) 5-325 MG per tablet Take 1 tablet by mouth every 4 (four) hours as needed. PAIN     pregabalin (LYRICA) 100 MG capsule Take 200 mg by mouth 2 (two) times daily.      traZODone (DESYREL) 50 MG tablet Take 50-100 mg by mouth at bedtime.          Diagnostic Studies: No results found.  Disposition: skilled nursing facility  Discharge Orders    Future Orders Please Complete By Expires   Diet - low sodium heart healthy      Call MD / Call 911      Comments:   If you experience chest pain or shortness of breath, CALL 911 and be transported to the hospital emergency room.  If you develope a fever above 101 F, pus (white drainage) or increased drainage or redness at the wound, or calf pain, call your surgeon's office.   Constipation Prevention      Comments:   Drink plenty of fluids.  Prune juice may be helpful.  You may use a stool softener, such as Colace (over the counter) 100 mg twice a day.  Use MiraLax (over the counter) for constipation as needed.   Increase activity slowly as tolerated      Weight Bearing as taught in Physical Therapy      Comments:   Use a walker or crutches as instructed.   Discharge instructions      Comments:   Non weight bearing left lower extremity         Signed: DIXON,THOMAS B 03/16/2011, 8:19 AM

## 2011-03-17 NOTE — Progress Notes (Signed)
03/17/2011 [name] bsn ccm [phone] cm SPOKE WITH PATIENT. cURRRENT PLANS ARE HOME WITH HH SERVICRES. [place] NE[place] WILL OFFER SUPPORT. [facility] will provide HH pt/ot/nurse's aid. [facility] will deliver wheelchair and 3N1 to home. Pt already has RW at home.

## 2011-03-17 NOTE — Progress Notes (Signed)
CSW assisting with D/C planning.  Pt does not qualify for medicare coverage at SNF.  Pt does not want to complete medicaid application for SNF ( due to financial issues ). Pt states she will return home and would like [facility] services and meal assistance. RNCM notified.

## 2011-03-17 NOTE — Progress Notes (Signed)
Physical Therapy Treatment Patient Details Name: [name] MRN: [id number] DOB: 1945-05-13 Today's Date: 03/17/2011 1314 - 1337 PT Assessment/Plan  PT - Assessment/Plan PT Plan: Discharge plan remains appropriate PT Frequency: Min 5X/week Follow Up Recommendations: Skilled nursing facility Equipment Recommended: Defer to next venue PT Goals  Acute Rehab PT Goals PT Goal Formulation: With patient Time For Goal Achievement: 7 days Pt will go Supine/Side to Sit: with supervision PT Goal: Supine/Side to Sit - Progress: Progressing toward goal Pt will go Sit to Supine/Side: with supervision Pt will go Sit to Stand: with supervision PT Goal: Sit to Stand - Progress: Progressing toward goal Pt will go Stand to Sit: with supervision PT Goal: Stand to Sit - Progress: Progressing toward goal Pt will Ambulate: 16 - 50 feet;with min assist;with rolling walker PT Goal: Ambulate - Progress: Progressing toward goal  PT Treatment Precautions/Restrictions  Precautions Precautions: Fall Required Braces or Orthoses: Yes (CAm walker) Restrictions Weight Bearing Restrictions: Yes LLE Weight Bearing: Touchdown weight bearing Mobility (including Balance) Bed Mobility Supine to Sit: 5: Supervision Transfers Sit to Stand: 4: Min assist;Other (comment) (min guard) Sit to Stand Details (indicate cue type and reason): cues for use of UEs Stand to Sit: 5: Supervision;4: Min assist;With upper extremity assist;With armrests;To chair/3-in-1 Stand to Sit Details: cues for LE position and use of UEs Ambulation/Gait Ambulation/Gait Assistance: 4: Min assist;5: Supervision Ambulation/Gait Assistance Details (indicate cue type and reason): cues for position from RW, stride length, posture and sequence Ambulation Distance (Feet): 25 Feet Assistive device: Rolling walker Gait Pattern: Step-to pattern    Exercise    End of Session PT - End of Session Equipment Utilized During Treatment: Gait  belt Activity Tolerance: Patient limited by fatigue (and ltd by inability to comply with TDWB) Patient left: in chair;with call bell in reach Nurse Communication: Mobility status for transfers;Mobility status for ambulation General Behavior During Session: [name] for tasks performed Cognition: [name] for tasks performed  [name] 03/17/2011, 4:25 PM

## 2011-03-17 NOTE — Progress Notes (Signed)
Occupational Therapy Treatment Patient Details Name: [name] MRN: [id number] DOB: May 18, 1945 Today's Date: 12/27/20121405 1420 1 [place]  OT Assessment/Plan OT Assessment/Plan OT Frequency: Min 2X/week Follow Up Recommendations: Other (comment);Home health OT and home health aide. (pt does not qualify for STSNF and has little support)  Also, pt may need 3:1 commode or elevated toilet seat with arm rests.  She states one she borrowed does not fit over her toilet.  OT Goals ADL Goals ADL Goal: Lower Body Bathing - Progress: Progressing toward goals ADL Goal: Lower Body Dressing - Progress: Progressing toward goals  OT Treatment Precautions/Restrictions  Precautions Precautions: Fall Restrictions Weight Bearing Restrictions: Yes LLE Weight Bearing: Touchdown weight bearing   ADL ADL Lower Body Bathing: Simulated;Minimal assistance;Other (comment) (min guard; able to cross legs to reach feet) Where Assessed - Lower Body Bathing: Sit to stand from chair Lower Body Dressing: Performed;Simulated;Minimal assistance;Other (comment) (min guard; performed right shoe; simulated underwear, crossing legs..the patient states she donned underwear earlier) Where Assessed - Lower Body Dressing: Sit to stand from chair ADL Comments: discussed safety with standing to maintain tdwb.   Mobility  Transfers Sit to Stand: 4: Min assist;Other (comment) (min guard) Exercises    End of Session General Behavior During Session: [facility] for tasks performed Cognition: [facility] for tasks performed  [name] [phone] 03/17/2011, 2:41 PM

## 2011-03-17 NOTE — Progress Notes (Signed)
Patient stable and ready for discharge. Patient given prescriptions and discharge instructions. She verbalized understanding of discharge instructions. No c/o pain at present. Discharge via stretcher with [facility]/ Patient to go home with home health assistance.

## 2011-03-17 NOTE — Progress Notes (Signed)
Physical Therapy Treatment Patient Details Name: [name] MRN: [id number] DOB: 1945/08/15 Today's Date: 03/17/2011 1540 - 1600, W/C management PT Assessment/Plan  PT - Assessment/Plan PT Plan: Discharge plan remains appropriate PT Frequency: Min 5X/week Follow Up Recommendations:  (pt to be d/c home) Equipment Recommended: Wheelchair (measurements) (measurements provided to RN and case management) PT Goals  Acute Rehab PT Goals PT Goal Formulation: With patient Time For Goal Achievement: 7 days Pt will go Supine/Side to Sit: with supervision PT Goal: Supine/Side to Sit - Progress: Met Pt will go Sit to Supine/Side: with supervision PT Goal: Sit to Supine/Side - Progress: Met Pt will go Sit to Stand: with supervision PT Goal: Sit to Stand - Progress: Partly met Pt will go Stand to Sit: with supervision PT Goal: Stand to Sit - Progress: Partly met Pt will Ambulate: 16 - 50 feet;with min assist;with rolling walker PT Goal: Ambulate - Progress: Partly met  PT Treatment Precautions/Restrictions  Precautions Precautions: Fall Required Braces or Orthoses: Yes Restrictions Weight Bearing Restrictions: Yes LLE Weight Bearing: Touchdown weight bearing Mobility (including Balance) Bed Mobility Supine to Sit: 5: Supervision Sit to Supine - Left: 5: Supervision Transfers Sit to Stand: 4: Min assist;Other (comment) (min guard) Sit to Stand Details (indicate cue type and reason): cues for LE position and use of UEs Stand to Sit: 5: Supervision;4: Min assist;With armrests;To bed;Other (comment) (min guard assist to [facility]) Stand to Sit Details: cues for LE position and use of UEs Ambulation/Gait Ambulation/Gait Assistance: 5: Supervision Ambulation/Gait Assistance Details (indicate cue type and reason): cues for position from RW Ambulation Distance (Feet):  (2x5') Assistive device: Rolling walker Gait Pattern: Step-to pattern Wheelchair Mobility Wheelchair Mobility: Yes Wheelchair  Assistance: 5: Supervision Wheelchair Assistance Details (indicate cue type and reason): cues for use of LE to steer/propel wc; cues for use of brakes for [employer]: Both upper extremities;Right lower extremity Wheelchair Parts Management: Supervision/cueing (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

## 2011-03-17 NOTE — Progress Notes (Signed)
Orthopedics Progress Note  Subjective: Pt resting in good shape. Minimal pain to left ankle. Awaiting snf placement  Objective:  Filed Vitals:   03/17/11 0554  BP: 156/73  Pulse: 63  Temp: 97.8 F (36.6 C)  Resp: 16    General: Awake and alert  Musculoskeletal: left ankle in cam walker, nv intact distally Neurovascularly intact  Lab Results  Component Value Date   WBC 5.2 03/14/2011   HGB 12.0 03/14/2011   HCT 36.7 03/14/2011   MCV 87.0 03/14/2011   PLT 139* 03/14/2011       Component Value Date/Time   NA 139 03/16/2011 0405   K 3.7 03/16/2011 0405   CL 104 03/16/2011 0405   CO2 27 03/16/2011 0405   GLUCOSE 112* 03/14/2011 1506   BUN 11 03/14/2011 1506   CREATININE 0.62 03/14/2011 1506   CALCIUM 9.4 03/14/2011 1506   GFRNONAA >90 03/14/2011 1506   GFRAA >90 03/14/2011 1506    Lab Results  Component Value Date   INR 1.12 01/30/2010    Assessment/Plan: Left ankle fx, pain with ambulation  Plan: Still awaiting SNF placement. Toe touch weight bearing for ambulation May discharge whenever bed available  Almedia Balls. Ranell Patrick, MD 03/17/2011 7:41 AM

## 2011-07-29 ENCOUNTER — Other Ambulatory Visit: Payer: Self-pay | Admitting: Neurosurgery

## 2011-07-29 DIAGNOSIS — T148XXA Other injury of unspecified body region, initial encounter: Secondary | ICD-10-CM

## 2011-08-05 ENCOUNTER — Inpatient Hospital Stay: Admission: RE | Admit: 2011-08-05 | Payer: Medicare Other | Source: Ambulatory Visit

## 2011-08-05 ENCOUNTER — Other Ambulatory Visit: Payer: Medicare Other

## 2011-08-22 ENCOUNTER — Ambulatory Visit
Admission: RE | Admit: 2011-08-22 | Discharge: 2011-08-22 | Disposition: A | Discharge status: Home / Self Care | Payer: Medicare Other | Source: Ambulatory Visit | Attending: Neurosurgery | Admitting: Neurosurgery

## 2011-08-22 DIAGNOSIS — T148XXA Other injury of unspecified body region, initial encounter: Secondary | ICD-10-CM

## 2011-08-25 ENCOUNTER — Other Ambulatory Visit: Payer: Self-pay | Admitting: Neurosurgery

## 2011-08-30 ENCOUNTER — Encounter (HOSPITAL_COMMUNITY): Payer: Self-pay | Admitting: Pharmacy Technician

## 2011-09-07 NOTE — Pre-Procedure Instructions (Signed)
20 SINDI BECKWORTH  09/07/2011   Your procedure is scheduled on:  June 25th  Report to [facility] at 0530 AM.  Call this number if you have problems the morning of surgery: [phone]   Remember:   Do not eat food or drink:After Midnight.  Take these medicines the morning of surgery with A SIP OF WATER: eye drops, symbicort, zyrtec, klonopin (if needed), Dexilant, Prozac, Flonase, Hydrocodone (if needed)   Do not wear jewelry, make-up or nail polish.  Do not wear lotions, powders, or perfumes.  Do not shave 48 hours prior to surgery. Men may shave face and neck.  Do not bring valuables to the hospital.  Contacts, dentures or bridgework may not be worn into surgery.  Leave suitcase in the car. After surgery it may be brought to your room.  For patients admitted to the hospital, checkout time is 11:00 AM the day of discharge.   Patients discharged the day of surgery will not be allowed to drive home.  Special Instructions: CHG Shower Use Special Wash: 1/2 bottle night before surgery and 1/2 bottle morning of surgery.   Please read over the following fact sheets that you were given: Pain Booklet, Coughing and Deep Breathing, Blood Transfusion Information, MRSA Information and Surgical Site Infection Prevention

## 2011-09-08 ENCOUNTER — Inpatient Hospital Stay (HOSPITAL_COMMUNITY): Admission: RE | Admit: 2011-09-08 | Discharge: 2011-09-08 | Payer: Medicare Other | Source: Ambulatory Visit

## 2011-09-12 ENCOUNTER — Encounter (HOSPITAL_COMMUNITY): Payer: Self-pay | Admitting: Pharmacy Technician

## 2011-09-12 ENCOUNTER — Encounter (HOSPITAL_COMMUNITY): Payer: Self-pay

## 2011-09-12 ENCOUNTER — Encounter (HOSPITAL_COMMUNITY)
Admission: RE | Admit: 2011-09-12 | Discharge: 2011-09-12 | Disposition: A | Discharge status: Home / Self Care | Payer: Medicare Other | Source: Ambulatory Visit | Attending: Neurosurgery | Admitting: Neurosurgery

## 2011-09-12 LAB — BASIC METABOLIC PANEL
BUN: 17 mg/dL (ref 6–23)
CO2: 25 mEq/L (ref 19–32)
Chloride: 101 mEq/L (ref 96–112)
Creatinine, Ser: 0.91 mg/dL (ref 0.50–1.10)
Glucose, Bld: 125 mg/dL — ABNORMAL HIGH (ref 70–99)

## 2011-09-12 LAB — ABO/RH: ABO/RH(D): O POS

## 2011-09-12 LAB — CBC
HCT: 39 % (ref 36.0–46.0)
MCV: 87.6 fL (ref 78.0–100.0)
RBC: 4.45 MIL/uL (ref 3.87–5.11)
RDW: 14 % (ref 11.5–15.5)
WBC: 6.3 10*3/uL (ref 4.0–10.5)

## 2011-09-12 LAB — SURGICAL PCR SCREEN: Staphylococcus aureus: NEGATIVE

## 2011-09-12 MED ORDER — VANCOMYCIN HCL IN DEXTROSE 1-5 GM/200ML-% IV SOLN
1000.0000 mg | INTRAVENOUS | Status: AC
  Administered 2011-09-13: 1000 mg via INTRAVENOUS
  Filled 2011-09-12: qty 200

## 2011-09-13 ENCOUNTER — Encounter (HOSPITAL_COMMUNITY)
Admission: RE | Disposition: A | Discharge status: Skilled Nursing Facility | Payer: Self-pay | Source: Ambulatory Visit | Attending: Neurosurgery

## 2011-09-13 ENCOUNTER — Encounter (HOSPITAL_COMMUNITY): Payer: Self-pay | Admitting: *Deleted

## 2011-09-13 ENCOUNTER — Inpatient Hospital Stay (HOSPITAL_COMMUNITY): Payer: Medicare Other

## 2011-09-13 ENCOUNTER — Inpatient Hospital Stay (HOSPITAL_COMMUNITY)
Admission: RE | Admit: 2011-09-13 | Discharge: 2011-09-22 | DRG: 456 | Disposition: A | Discharge status: Skilled Nursing Facility | Payer: Medicare Other | Source: Ambulatory Visit | Attending: Neurosurgery | Admitting: Neurosurgery

## 2011-09-13 ENCOUNTER — Inpatient Hospital Stay (HOSPITAL_COMMUNITY): Payer: Medicare Other | Admitting: Anesthesiology

## 2011-09-13 ENCOUNTER — Encounter (HOSPITAL_COMMUNITY): Payer: Self-pay | Admitting: Anesthesiology

## 2011-09-13 DIAGNOSIS — F329 Major depressive disorder, single episode, unspecified: Secondary | ICD-10-CM | POA: Diagnosis present

## 2011-09-13 DIAGNOSIS — N179 Acute kidney failure, unspecified: Secondary | ICD-10-CM

## 2011-09-13 DIAGNOSIS — K219 Gastro-esophageal reflux disease without esophagitis: Secondary | ICD-10-CM | POA: Diagnosis present

## 2011-09-13 DIAGNOSIS — R7309 Other abnormal glucose: Secondary | ICD-10-CM | POA: Diagnosis not present

## 2011-09-13 DIAGNOSIS — I10 Essential (primary) hypertension: Secondary | ICD-10-CM | POA: Diagnosis present

## 2011-09-13 DIAGNOSIS — R0609 Other forms of dyspnea: Secondary | ICD-10-CM | POA: Diagnosis not present

## 2011-09-13 DIAGNOSIS — F3289 Other specified depressive episodes: Secondary | ICD-10-CM | POA: Diagnosis present

## 2011-09-13 DIAGNOSIS — Z882 Allergy status to sulfonamides status: Secondary | ICD-10-CM

## 2011-09-13 DIAGNOSIS — Y9210 Unspecified residential institution as the place of occurrence of the external cause: Secondary | ICD-10-CM | POA: Diagnosis not present

## 2011-09-13 DIAGNOSIS — R0600 Dyspnea, unspecified: Secondary | ICD-10-CM | POA: Diagnosis not present

## 2011-09-13 DIAGNOSIS — N170 Acute kidney failure with tubular necrosis: Secondary | ICD-10-CM | POA: Diagnosis not present

## 2011-09-13 DIAGNOSIS — R0989 Other specified symptoms and signs involving the circulatory and respiratory systems: Secondary | ICD-10-CM | POA: Diagnosis not present

## 2011-09-13 DIAGNOSIS — M4000 Postural kyphosis, site unspecified: Secondary | ICD-10-CM | POA: Diagnosis present

## 2011-09-13 DIAGNOSIS — M810 Age-related osteoporosis without current pathological fracture: Secondary | ICD-10-CM | POA: Diagnosis present

## 2011-09-13 DIAGNOSIS — D649 Anemia, unspecified: Secondary | ICD-10-CM | POA: Diagnosis not present

## 2011-09-13 DIAGNOSIS — R34 Anuria and oliguria: Secondary | ICD-10-CM

## 2011-09-13 DIAGNOSIS — E860 Dehydration: Secondary | ICD-10-CM | POA: Diagnosis not present

## 2011-09-13 DIAGNOSIS — Z8249 Family history of ischemic heart disease and other diseases of the circulatory system: Secondary | ICD-10-CM

## 2011-09-13 DIAGNOSIS — Z9889 Other specified postprocedural states: Secondary | ICD-10-CM

## 2011-09-13 DIAGNOSIS — N390 Urinary tract infection, site not specified: Secondary | ICD-10-CM | POA: Diagnosis not present

## 2011-09-13 DIAGNOSIS — S82899A Other fracture of unspecified lower leg, initial encounter for closed fracture: Secondary | ICD-10-CM

## 2011-09-13 DIAGNOSIS — F411 Generalized anxiety disorder: Secondary | ICD-10-CM | POA: Diagnosis present

## 2011-09-13 DIAGNOSIS — Z880 Allergy status to penicillin: Secondary | ICD-10-CM

## 2011-09-13 DIAGNOSIS — I959 Hypotension, unspecified: Secondary | ICD-10-CM | POA: Diagnosis not present

## 2011-09-13 DIAGNOSIS — M8448XA Pathological fracture, other site, initial encounter for fracture: Principal | ICD-10-CM | POA: Diagnosis present

## 2011-09-13 DIAGNOSIS — T50995A Adverse effect of other drugs, medicaments and biological substances, initial encounter: Secondary | ICD-10-CM | POA: Diagnosis not present

## 2011-09-13 DIAGNOSIS — Z79899 Other long term (current) drug therapy: Secondary | ICD-10-CM

## 2011-09-13 DIAGNOSIS — G9520 Unspecified cord compression: Secondary | ICD-10-CM

## 2011-09-13 DIAGNOSIS — E7800 Pure hypercholesterolemia, unspecified: Secondary | ICD-10-CM | POA: Diagnosis present

## 2011-09-13 DIAGNOSIS — F19921 Other psychoactive substance use, unspecified with intoxication with delirium: Secondary | ICD-10-CM | POA: Diagnosis not present

## 2011-09-13 DIAGNOSIS — E785 Hyperlipidemia, unspecified: Secondary | ICD-10-CM | POA: Diagnosis present

## 2011-09-13 DIAGNOSIS — Z823 Family history of stroke: Secondary | ICD-10-CM

## 2011-09-13 DIAGNOSIS — H409 Unspecified glaucoma: Secondary | ICD-10-CM | POA: Diagnosis present

## 2011-09-13 DIAGNOSIS — D696 Thrombocytopenia, unspecified: Secondary | ICD-10-CM | POA: Diagnosis present

## 2011-09-13 DIAGNOSIS — Z8673 Personal history of transient ischemic attack (TIA), and cerebral infarction without residual deficits: Secondary | ICD-10-CM

## 2011-09-13 DIAGNOSIS — R4182 Altered mental status, unspecified: Secondary | ICD-10-CM

## 2011-09-13 DIAGNOSIS — Z91040 Latex allergy status: Secondary | ICD-10-CM

## 2011-09-13 DIAGNOSIS — J45909 Unspecified asthma, uncomplicated: Secondary | ICD-10-CM | POA: Diagnosis present

## 2011-09-13 SURGERY — POSTERIOR LUMBAR FUSION 4 LEVEL
Anesthesia: General | Site: Spine Thoracic | Wound class: Clean

## 2011-09-13 MED ORDER — TRAZODONE HCL 100 MG PO TABS
100.0000 mg | ORAL_TABLET | Freq: Every day | ORAL | Status: DC
  Administered 2011-09-13 – 2011-09-14 (×2): 100 mg via ORAL
  Filled 2011-09-13 (×3): qty 1

## 2011-09-13 MED ORDER — GLYCOPYRROLATE 0.2 MG/ML IJ SOLN
INTRAMUSCULAR | Status: DC | PRN
  Administered 2011-09-13: .5 mg via INTRAVENOUS

## 2011-09-13 MED ORDER — PREGABALIN 50 MG PO CAPS: 100.0000 mg | ORAL_CAPSULE | Freq: Two times a day (BID) | ORAL | Status: DC

## 2011-09-13 MED ORDER — BUDESONIDE-FORMOTEROL FUMARATE 160-4.5 MCG/ACT IN AERO
1.0000 | INHALATION_SPRAY | Freq: Two times a day (BID) | RESPIRATORY_TRACT | Status: DC
  Filled 2011-09-13: qty 6

## 2011-09-13 MED ORDER — FLEET ENEMA 7-19 GM/118ML RE ENEM
1.0000 | ENEMA | Freq: Once | RECTAL | Status: AC | PRN
  Filled 2011-09-13: qty 1

## 2011-09-13 MED ORDER — CLONAZEPAM 0.5 MG PO TABS
0.5000 mg | ORAL_TABLET | Freq: Two times a day (BID) | ORAL | Status: DC
  Administered 2011-09-14: 0.5 mg via ORAL
  Filled 2011-09-13 (×2): qty 1

## 2011-09-13 MED ORDER — DIPHENHYDRAMINE HCL 50 MG/ML IJ SOLN: 12.5000 mg | Freq: Four times a day (QID) | INTRAMUSCULAR | Status: DC | PRN

## 2011-09-13 MED ORDER — SODIUM CHLORIDE 0.9 % IJ SOLN: 9.0000 mL | INTRAMUSCULAR | Status: DC | PRN

## 2011-09-13 MED ORDER — SODIUM CHLORIDE 0.9 % IR SOLN
Status: DC | PRN
  Administered 2011-09-13: 09:00:00

## 2011-09-13 MED ORDER — PROPOFOL 10 MG/ML IV EMUL
INTRAVENOUS | Status: DC | PRN
  Administered 2011-09-13: 150 mg via INTRAVENOUS

## 2011-09-13 MED ORDER — FENTANYL CITRATE 0.05 MG/ML IJ SOLN
INTRAMUSCULAR | Status: DC | PRN
  Administered 2011-09-13 (×2): 100 ug via INTRAVENOUS
  Administered 2011-09-13: 50 ug via INTRAVENOUS
  Administered 2011-09-13: 150 ug via INTRAVENOUS
  Administered 2011-09-13: 100 ug via INTRAVENOUS

## 2011-09-13 MED ORDER — SODIUM CHLORIDE 0.9 % IJ SOLN
3.0000 mL | Freq: Two times a day (BID) | INTRAMUSCULAR | Status: DC
  Administered 2011-09-13 – 2011-09-17 (×3): 3 mL via INTRAVENOUS
  Administered 2011-09-18: 11:00:00 via INTRAVENOUS
  Administered 2011-09-18: 3 mL via INTRAVENOUS
  Administered 2011-09-19: 6 mL via INTRAVENOUS
  Administered 2011-09-20: 10 mL via INTRAVENOUS
  Administered 2011-09-20 – 2011-09-22 (×4): 3 mL via INTRAVENOUS

## 2011-09-13 MED ORDER — HYDROMORPHONE HCL PF 1 MG/ML IJ SOLN
0.2500 mg | INTRAMUSCULAR | Status: DC | PRN
  Administered 2011-09-13: 0.5 mg via INTRAVENOUS

## 2011-09-13 MED ORDER — ACETAMINOPHEN 650 MG RE SUPP: 650.0000 mg | RECTAL | Status: DC | PRN

## 2011-09-13 MED ORDER — LAMOTRIGINE 150 MG PO TABS
150.0000 mg | ORAL_TABLET | Freq: Every day | ORAL | Status: DC
Start: 2011-09-13 — End: 2011-09-22
  Administered 2011-09-14 – 2011-09-22 (×9): 150 mg via ORAL
  Filled 2011-09-13 (×10): qty 1

## 2011-09-13 MED ORDER — MIDAZOLAM HCL 5 MG/5ML IJ SOLN
INTRAMUSCULAR | Status: DC | PRN
  Administered 2011-09-13: 2 mg via INTRAVENOUS

## 2011-09-13 MED ORDER — SENNA 8.6 MG PO TABS
1.0000 | ORAL_TABLET | Freq: Two times a day (BID) | ORAL | Status: DC
  Administered 2011-09-13 – 2011-09-22 (×16): 8.6 mg via ORAL
  Filled 2011-09-13 (×20): qty 1

## 2011-09-13 MED ORDER — HYDROMORPHONE HCL PF 1 MG/ML IJ SOLN
INTRAMUSCULAR | Status: AC
  Filled 2011-09-13: qty 1

## 2011-09-13 MED ORDER — HYDROCODONE-ACETAMINOPHEN 5-325 MG PO TABS
1.0000 | ORAL_TABLET | ORAL | Status: DC | PRN
  Administered 2011-09-14 – 2011-09-15 (×2): 2 via ORAL
  Filled 2011-09-13 (×2): qty 2

## 2011-09-13 MED ORDER — BACLOFEN 10 MG PO TABS
10.0000 mg | ORAL_TABLET | Freq: Every day | ORAL | Status: DC
  Administered 2011-09-13 – 2011-09-21 (×9): 10 mg via ORAL
  Filled 2011-09-13 (×10): qty 1

## 2011-09-13 MED ORDER — SODIUM CHLORIDE 0.9 % IJ SOLN: 3.0000 mL | INTRAMUSCULAR | Status: DC | PRN

## 2011-09-13 MED ORDER — ONDANSETRON HCL 4 MG/2ML IJ SOLN
INTRAMUSCULAR | Status: DC | PRN
  Administered 2011-09-13: 4 mg via INTRAVENOUS

## 2011-09-13 MED ORDER — LIDOCAINE-EPINEPHRINE 1 %-1:100000 IJ SOLN
INTRAMUSCULAR | Status: DC | PRN
  Administered 2011-09-13: 10 mL

## 2011-09-13 MED ORDER — LACTATED RINGERS IV SOLN
INTRAVENOUS | Status: DC | PRN
  Administered 2011-09-13 (×3): via INTRAVENOUS

## 2011-09-13 MED ORDER — PREGABALIN 75 MG PO CAPS
400.0000 mg | ORAL_CAPSULE | Freq: Every day | ORAL | Status: DC
  Administered 2011-09-14 – 2011-09-21 (×8): 400 mg via ORAL
  Filled 2011-09-13 (×5): qty 8
  Filled 2011-09-13: qty 5
  Filled 2011-09-13: qty 2
  Filled 2011-09-13: qty 6

## 2011-09-13 MED ORDER — ALBUMIN HUMAN 5 % IV SOLN
INTRAVENOUS | Status: DC | PRN
  Administered 2011-09-13: 09:00:00 via INTRAVENOUS

## 2011-09-13 MED ORDER — THROMBIN 20000 UNITS EX KIT
PACK | CUTANEOUS | Status: DC | PRN
  Administered 2011-09-13: 08:00:00 via TOPICAL

## 2011-09-13 MED ORDER — LEVALBUTEROL TARTRATE 45 MCG/ACT IN AERO
1.0000 | INHALATION_SPRAY | RESPIRATORY_TRACT | Status: DC | PRN
  Filled 2011-09-13: qty 15

## 2011-09-13 MED ORDER — FLUTICASONE PROPIONATE 50 MCG/ACT NA SUSP
2.0000 | Freq: Two times a day (BID) | NASAL | Status: DC
  Administered 2011-09-13 – 2011-09-22 (×18): 2 via NASAL
  Filled 2011-09-13: qty 16

## 2011-09-13 MED ORDER — ATORVASTATIN CALCIUM 20 MG PO TABS
20.0000 mg | ORAL_TABLET | Freq: Every day | ORAL | Status: DC
  Administered 2011-09-14 – 2011-09-22 (×9): 20 mg via ORAL
  Filled 2011-09-13 (×10): qty 1

## 2011-09-13 MED ORDER — OXYCODONE-ACETAMINOPHEN 5-325 MG PO TABS: 1.0000 | ORAL_TABLET | ORAL | Status: DC | PRN

## 2011-09-13 MED ORDER — NEOSTIGMINE METHYLSULFATE 1 MG/ML IJ SOLN
INTRAMUSCULAR | Status: DC | PRN
  Administered 2011-09-13: 4 mg via INTRAVENOUS

## 2011-09-13 MED ORDER — LAMOTRIGINE 25 MG PO TABS
75.0000 mg | ORAL_TABLET | Freq: Every day | ORAL | Status: DC
  Administered 2011-09-13 – 2011-09-21 (×9): 75 mg via ORAL
  Filled 2011-09-13 (×10): qty 3

## 2011-09-13 MED ORDER — MENTHOL 3 MG MT LOZG
1.0000 | LOZENGE | OROMUCOSAL | Status: DC | PRN
  Filled 2011-09-13: qty 9

## 2011-09-13 MED ORDER — KCL IN DEXTROSE-NACL 20-5-0.45 MEQ/L-%-% IV SOLN
INTRAVENOUS | Status: AC
  Filled 2011-09-13: qty 1000

## 2011-09-13 MED ORDER — DROPERIDOL 2.5 MG/ML IJ SOLN: 0.6250 mg | INTRAMUSCULAR | Status: DC | PRN

## 2011-09-13 MED ORDER — SODIUM CHLORIDE 0.9 % IV SOLN
INTRAVENOUS | Status: DC | PRN
  Administered 2011-09-13: 10:00:00 via INTRAVENOUS

## 2011-09-13 MED ORDER — SODIUM CHLORIDE 0.9 % IV SOLN: 250.0000 mL | INTRAVENOUS | Status: DC

## 2011-09-13 MED ORDER — SODIUM CHLORIDE 0.9 % IV SOLN
INTRAVENOUS | Status: AC
  Filled 2011-09-13: qty 500

## 2011-09-13 MED ORDER — BUMETANIDE 2 MG PO TABS
4.0000 mg | ORAL_TABLET | Freq: Every day | ORAL | Status: DC | PRN
  Filled 2011-09-13: qty 2

## 2011-09-13 MED ORDER — LAMOTRIGINE 25 MG PO TABS: 75.0000 mg | ORAL_TABLET | Freq: Two times a day (BID) | ORAL | Status: DC

## 2011-09-13 MED ORDER — PREGABALIN 50 MG PO CAPS
100.0000 mg | ORAL_CAPSULE | Freq: Every day | ORAL | Status: DC
  Administered 2011-09-14 – 2011-09-22 (×8): 100 mg via ORAL
  Filled 2011-09-13: qty 2
  Filled 2011-09-13: qty 1
  Filled 2011-09-13 (×8): qty 2

## 2011-09-13 MED ORDER — HYDROCODONE-ACETAMINOPHEN 5-325 MG PO TABS: 1.0000 | ORAL_TABLET | ORAL | Status: DC | PRN

## 2011-09-13 MED ORDER — HEMOSTATIC AGENTS (NO CHARGE) OPTIME
TOPICAL | Status: DC | PRN
  Administered 2011-09-13: 1 via TOPICAL

## 2011-09-13 MED ORDER — HYDROMORPHONE 0.3 MG/ML IV SOLN
INTRAVENOUS | Status: AC
  Filled 2011-09-13: qty 25

## 2011-09-13 MED ORDER — NALOXONE HCL 0.4 MG/ML IJ SOLN: 0.4000 mg | INTRAMUSCULAR | Status: DC | PRN

## 2011-09-13 MED ORDER — KCL IN DEXTROSE-NACL 20-5-0.45 MEQ/L-%-% IV SOLN
INTRAVENOUS | Status: DC
  Administered 2011-09-13 – 2011-09-15 (×4): via INTRAVENOUS
  Filled 2011-09-13 (×6): qty 1000

## 2011-09-13 MED ORDER — BIMATOPROST 0.01 % OP SOLN
1.0000 [drp] | Freq: Two times a day (BID) | OPHTHALMIC | Status: DC
  Administered 2011-09-13 – 2011-09-22 (×18): 1 [drp] via OPHTHALMIC
  Filled 2011-09-13 (×2): qty 2.5

## 2011-09-13 MED ORDER — LORATADINE 10 MG PO TABS
10.0000 mg | ORAL_TABLET | Freq: Every day | ORAL | Status: DC
  Administered 2011-09-14 – 2011-09-19 (×6): 10 mg via ORAL
  Filled 2011-09-13 (×8): qty 1

## 2011-09-13 MED ORDER — DIPHENHYDRAMINE HCL 12.5 MG/5ML PO ELIX
12.5000 mg | ORAL_SOLUTION | Freq: Four times a day (QID) | ORAL | Status: DC | PRN
  Filled 2011-09-13: qty 5

## 2011-09-13 MED ORDER — FLUOXETINE HCL 20 MG PO TABS
40.0000 mg | ORAL_TABLET | Freq: Every day | ORAL | Status: DC
  Administered 2011-09-16 – 2011-09-22 (×7): 40 mg via ORAL
  Filled 2011-09-13 (×10): qty 2

## 2011-09-13 MED ORDER — PANTOPRAZOLE SODIUM 40 MG PO TBEC
40.0000 mg | DELAYED_RELEASE_TABLET | Freq: Every day | ORAL | Status: DC
  Administered 2011-09-14 – 2011-09-22 (×8): 40 mg via ORAL
  Filled 2011-09-13 (×10): qty 1

## 2011-09-13 MED ORDER — BUPIVACAINE HCL (PF) 0.5 % IJ SOLN
INTRAMUSCULAR | Status: DC | PRN
  Administered 2011-09-13: 10 mL

## 2011-09-13 MED ORDER — METHOCARBAMOL 500 MG PO TABS
500.0000 mg | ORAL_TABLET | Freq: Three times a day (TID) | ORAL | Status: DC | PRN
Start: 2011-09-13 — End: 2011-09-22
  Administered 2011-09-17 – 2011-09-22 (×8): 500 mg via ORAL
  Filled 2011-09-13 (×8): qty 1

## 2011-09-13 MED ORDER — ONDANSETRON HCL 4 MG/2ML IJ SOLN: 4.0000 mg | INTRAMUSCULAR | Status: DC | PRN

## 2011-09-13 MED ORDER — TRAZODONE HCL 50 MG PO TABS: 50.0000 mg | ORAL_TABLET | Freq: Every day | ORAL | Status: DC

## 2011-09-13 MED ORDER — VANCOMYCIN HCL IN DEXTROSE 1-5 GM/200ML-% IV SOLN
1000.0000 mg | Freq: Two times a day (BID) | INTRAVENOUS | Status: AC
  Administered 2011-09-13 – 2011-09-14 (×2): 1000 mg via INTRAVENOUS
  Filled 2011-09-13 (×2): qty 200

## 2011-09-13 MED ORDER — HYDROMORPHONE 0.3 MG/ML IV SOLN
INTRAVENOUS | Status: DC
  Administered 2011-09-13: 12:00:00 via INTRAVENOUS
  Administered 2011-09-13: 1.2 mg via INTRAVENOUS
  Administered 2011-09-13: 1.5 mg via INTRAVENOUS
  Administered 2011-09-14: 0.9 mg via INTRAVENOUS

## 2011-09-13 MED ORDER — ZOLPIDEM TARTRATE 5 MG PO TABS: 5.0000 mg | ORAL_TABLET | Freq: Every evening | ORAL | Status: DC | PRN

## 2011-09-13 MED ORDER — 0.9 % SODIUM CHLORIDE (POUR BTL) OPTIME
TOPICAL | Status: DC | PRN
  Administered 2011-09-13: 1000 mL

## 2011-09-13 MED ORDER — NISOLDIPINE ER 17 MG PO TB24
17.0000 mg | ORAL_TABLET | Freq: Every day | ORAL | Status: DC
  Filled 2011-09-13: qty 1

## 2011-09-13 MED ORDER — DIAZEPAM 5 MG PO TABS
5.0000 mg | ORAL_TABLET | Freq: Four times a day (QID) | ORAL | Status: DC | PRN
  Administered 2011-09-17: 5 mg via ORAL
  Filled 2011-09-13: qty 1

## 2011-09-13 MED ORDER — ACETAMINOPHEN 325 MG PO TABS
650.0000 mg | ORAL_TABLET | ORAL | Status: DC | PRN
  Administered 2011-09-21 (×2): 650 mg via ORAL
  Filled 2011-09-13 (×2): qty 2

## 2011-09-13 MED ORDER — BRINZOLAMIDE 1 % OP SUSP
1.0000 [drp] | Freq: Two times a day (BID) | OPHTHALMIC | Status: DC
  Administered 2011-09-13 – 2011-09-22 (×18): 1 [drp] via OPHTHALMIC
  Filled 2011-09-13: qty 10

## 2011-09-13 MED ORDER — BISACODYL 10 MG RE SUPP
10.0000 mg | Freq: Every day | RECTAL | Status: DC | PRN
  Administered 2011-09-22: 10 mg via RECTAL
  Filled 2011-09-13: qty 1

## 2011-09-13 MED ORDER — LIDOCAINE HCL (CARDIAC) 20 MG/ML IV SOLN
INTRAVENOUS | Status: DC | PRN
  Administered 2011-09-13: 50 mg via INTRAVENOUS

## 2011-09-13 MED ORDER — BACITRACIN 50000 UNITS IM SOLR
INTRAMUSCULAR | Status: AC
  Filled 2011-09-13: qty 1

## 2011-09-13 MED ORDER — POTASSIUM CHLORIDE 20 MEQ/15ML (10%) PO LIQD
10.0000 meq | Freq: Every day | ORAL | Status: DC
  Filled 2011-09-13: qty 7.5

## 2011-09-13 MED ORDER — PHENOL 1.4 % MT LIQD: 1.0000 | OROMUCOSAL | Status: DC | PRN

## 2011-09-13 MED ORDER — ROCURONIUM BROMIDE 100 MG/10ML IV SOLN
INTRAVENOUS | Status: DC | PRN
  Administered 2011-09-13: 50 mg via INTRAVENOUS
  Administered 2011-09-13 (×2): 20 mg via INTRAVENOUS
  Administered 2011-09-13: 30 mg via INTRAVENOUS

## 2011-09-13 MED ORDER — ONDANSETRON HCL 4 MG/2ML IJ SOLN: 4.0000 mg | Freq: Four times a day (QID) | INTRAMUSCULAR | Status: DC | PRN

## 2011-09-13 MED ORDER — SENNOSIDES-DOCUSATE SODIUM 8.6-50 MG PO TABS
1.0000 | ORAL_TABLET | Freq: Every evening | ORAL | Status: DC | PRN
  Administered 2011-09-18: 1 via ORAL
  Filled 2011-09-13 (×2): qty 1

## 2011-09-13 SURGICAL SUPPLY — 95 items
ADH SKN CLS APL DERMABOND .7 (GAUZE/BANDAGES/DRESSINGS) ×2
APL SKNCLS STERI-STRIP NONHPOA (GAUZE/BANDAGES/DRESSINGS) ×2
BAG DECANTER FOR FLEXI CONT (MISCELLANEOUS) ×3 IMPLANT
BENZOIN TINCTURE PRP APPL 2/3 (GAUZE/BANDAGES/DRESSINGS) ×1 IMPLANT
BLADE SURG 15 STRL LF DISP TIS (BLADE) ×2 IMPLANT
BLADE SURG 15 STRL SS (BLADE) ×3
BLADE SURG ROTATE 9660 (MISCELLANEOUS) IMPLANT
BONE VOID FILLER STRIP 10CC (Bone Implant) ×2 IMPLANT
BUR MATCHSTICK NEURO 3.0 LAGG (BURR) ×3 IMPLANT
BUR PRECISION FLUTE 5.0 (BURR) ×3 IMPLANT
CANISTER SUCTION 2500CC (MISCELLANEOUS) ×3 IMPLANT
CEMENT BONE KYPHX HV R (Orthopedic Implant) ×1 IMPLANT
CLOTH BEACON ORANGE TIMEOUT ST (SAFETY) ×3 IMPLANT
CONT SPEC 4OZ CLIKSEAL STRL BL (MISCELLANEOUS) ×6 IMPLANT
COVER BACK TABLE 24X17X13 BIG (DRAPES) ×1 IMPLANT
COVER TABLE BACK 60X90 (DRAPES) ×3 IMPLANT
CROSSLINK MEDIUM (Cage) ×1 IMPLANT
DERMABOND ADVANCED (GAUZE/BANDAGES/DRESSINGS) ×1
DERMABOND ADVANCED .7 DNX12 (GAUZE/BANDAGES/DRESSINGS) ×2 IMPLANT
DRAPE C-ARM 42X72 X-RAY (DRAPES) ×6 IMPLANT
DRAPE C-ARMOR (DRAPES) ×1 IMPLANT
DRAPE LAPAROTOMY 100X72X124 (DRAPES) ×3 IMPLANT
DRAPE POUCH INSTRU U-SHP 10X18 (DRAPES) ×3 IMPLANT
DRAPE PROXIMA HALF (DRAPES) ×3 IMPLANT
DRAPE SURG 17X23 STRL (DRAPES) ×3 IMPLANT
DRESSING TELFA 8X3 (GAUZE/BANDAGES/DRESSINGS) ×2 IMPLANT
DURAPREP 26ML APPLICATOR (WOUND CARE) ×3 IMPLANT
ELECT CAUTERY BLADE 6.4 (BLADE) ×1 IMPLANT
ELECT REM PT RETURN 9FT ADLT (ELECTROSURGICAL) ×3
ELECTRODE REM PT RTRN 9FT ADLT (ELECTROSURGICAL) ×2 IMPLANT
GAUZE SPONGE 4X4 16PLY XRAY LF (GAUZE/BANDAGES/DRESSINGS) ×3 IMPLANT
GLOVE BIO SURGEON STRL SZ8 (GLOVE) ×6 IMPLANT
GLOVE BIOGEL PI IND STRL 7.0 (GLOVE) IMPLANT
GLOVE BIOGEL PI IND STRL 7.5 (GLOVE) IMPLANT
GLOVE BIOGEL PI IND STRL 8 (GLOVE) ×4 IMPLANT
GLOVE BIOGEL PI IND STRL 8.5 (GLOVE) ×4 IMPLANT
GLOVE BIOGEL PI INDICATOR 7.0 (GLOVE) ×1
GLOVE BIOGEL PI INDICATOR 7.5 (GLOVE) ×2
GLOVE BIOGEL PI INDICATOR 8 (GLOVE) ×2
GLOVE BIOGEL PI INDICATOR 8.5 (GLOVE) ×2
GLOVE ECLIPSE 8.0 STRL XLNG CF (GLOVE) ×4 IMPLANT
GLOVE EXAM NITRILE LRG STRL (GLOVE) IMPLANT
GLOVE EXAM NITRILE MD LF STRL (GLOVE) ×1 IMPLANT
GLOVE EXAM NITRILE XL STR (GLOVE) IMPLANT
GLOVE EXAM NITRILE XS STR PU (GLOVE) IMPLANT
GLOVE SURG SS PI 6.5 STRL IVOR (GLOVE) ×4 IMPLANT
GLOVE SURG SS PI 7.5 STRL IVOR (GLOVE) ×1 IMPLANT
GLOVE SURG SS PI 8.0 STRL IVOR (GLOVE) ×4 IMPLANT
GOWN BRE IMP SLV AUR LG STRL (GOWN DISPOSABLE) IMPLANT
GOWN BRE IMP SLV AUR XL STRL (GOWN DISPOSABLE) ×7 IMPLANT
GOWN STRL REIN 2XL LVL4 (GOWN DISPOSABLE) ×6 IMPLANT
KIT BASIN OR (CUSTOM PROCEDURE TRAY) ×3 IMPLANT
KIT INFUSE LRG II (Orthopedic Implant) ×1 IMPLANT
KIT POSITION SURG JACKSON T1 (MISCELLANEOUS) ×3 IMPLANT
KIT ROOM TURNOVER OR (KITS) ×3 IMPLANT
MILL MEDIUM DISP (BLADE) ×3 IMPLANT
NDL HYPO 25X1 1.5 SAFETY (NEEDLE) ×2 IMPLANT
NDL SPNL 18GX3.5 QUINCKE PK (NEEDLE) IMPLANT
NEEDLE HYPO 25X1 1.5 SAFETY (NEEDLE) ×3 IMPLANT
NEEDLE SPNL 18GX3.5 QUINCKE PK (NEEDLE) ×3 IMPLANT
NS IRRIG 1000ML POUR BTL (IV SOLUTION) ×5 IMPLANT
PACK LAMINECTOMY NEURO (CUSTOM PROCEDURE TRAY) ×3 IMPLANT
PACK SURGICAL SETUP 50X90 (CUSTOM PROCEDURE TRAY) ×3 IMPLANT
PAD ARMBOARD 7.5X6 YLW CONV (MISCELLANEOUS) ×9 IMPLANT
PATTIES SURGICAL .5 X.5 (GAUZE/BANDAGES/DRESSINGS) IMPLANT
PATTIES SURGICAL .5 X1 (DISPOSABLE) IMPLANT
PATTIES SURGICAL 1X1 (DISPOSABLE) IMPLANT
ROD ZODIAC 5.5X150MM (Rod) ×2 IMPLANT
SCREW 40MM (Screw) ×4 IMPLANT
SCREW 45MM (Screw) ×2 IMPLANT
SCREW POLYAX 6.5X45MM (Screw) ×4 IMPLANT
SCREW SET SPINAL STD HEXALOBE (Screw) ×10 IMPLANT
SPONGE GAUZE 4X4 12PLY (GAUZE/BANDAGES/DRESSINGS) ×3 IMPLANT
SPONGE LAP 4X18 X RAY DECT (DISPOSABLE) IMPLANT
SPONGE SURGIFOAM ABS GEL 100 (HEMOSTASIS) ×3 IMPLANT
STAPLER SKIN PROX WIDE 3.9 (STAPLE) ×3 IMPLANT
STRIP CLOSURE SKIN 1/2X4 (GAUZE/BANDAGES/DRESSINGS) ×4 IMPLANT
SUT VIC AB 1 CT1 18XBRD ANBCTR (SUTURE) ×4 IMPLANT
SUT VIC AB 1 CT1 8-18 (SUTURE) ×6
SUT VIC AB 2-0 CP2 18 (SUTURE) ×1 IMPLANT
SUT VIC AB 2-0 CT1 18 (SUTURE) ×7 IMPLANT
SUT VIC AB 3-0 SH 8-18 (SUTURE) ×6 IMPLANT
SYR 20CC LL (SYRINGE) ×3 IMPLANT
SYR 3ML LL SCALE MARK (SYRINGE) ×8 IMPLANT
SYR 5ML LL (SYRINGE) IMPLANT
SYR BULB IRRIGATION 50ML (SYRINGE) ×1 IMPLANT
SYR CONTROL 10ML LL (SYRINGE) ×6 IMPLANT
TAPE CLOTH SURG 4X10 WHT LF (GAUZE/BANDAGES/DRESSINGS) ×1 IMPLANT
TOWEL OR 17X24 6PK STRL BLUE (TOWEL DISPOSABLE) ×3 IMPLANT
TOWEL OR 17X26 10 PK STRL BLUE (TOWEL DISPOSABLE) ×3 IMPLANT
TRAP SPECIMEN MUCOUS 40CC (MISCELLANEOUS) ×3 IMPLANT
TRAY FOLEY CATH 14FRSI W/METER (CATHETERS) ×2 IMPLANT
TRAY KYPHOPAK 20/3 ONESTEP 1ST (MISCELLANEOUS) ×1 IMPLANT
TRAY KYPHOPAK 20/3 ONESTEP CDS (KITS) ×1 IMPLANT
WATER STERILE IRR 1000ML POUR (IV SOLUTION) ×3 IMPLANT

## 2011-09-13 NOTE — Plan of Care (Signed)
Problem: Consults Goal: Diagnosis - Spinal Surgery Outcome: Completed/Met Date Met:  09/13/11 Thoraco/Lumbar Spine Fusion

## 2011-09-13 NOTE — Anesthesia Procedure Notes (Signed)
Procedure Name: Intubation Date/Time: 09/13/2011 7:36 AM Performed by: Nicholos Johns Pre-anesthesia Checklist: Patient identified, Emergency Drugs available, Suction available, Patient being monitored and Timeout performed Patient Re-evaluated:Patient Re-evaluated prior to inductionOxygen Delivery Method: Circle system utilized Preoxygenation: Pre-oxygenation with 100% oxygen Intubation Type: IV induction Ventilation: Mask ventilation without difficulty Laryngoscope Size: Mac and 3 Grade View: Grade I Tube type: Oral Tube size: 7.5 mm Number of attempts: 1 Airway Equipment and Method: Stylet Placement Confirmation: ETT inserted through vocal cords under direct vision,  positive ETCO2 and breath sounds checked- equal and bilateral Secured at: 21 cm Tube secured with: Tape Dental Injury: Teeth and Oropharynx as per pre-operative assessment

## 2011-09-13 NOTE — Progress Notes (Signed)
Patient ID: [name], female   DOB: [birth year], 66 y.o.   MRN: [id number]  Alert, conversant. Good strength BLE. Thoraco-lumbar pain only at present, partially controlled by half-dose dilaudid PCA. No leg or buttock discomfort.  BP's stable now, WNL - ok to increase to Full Dose PCA per Dr. Venetia Maxon. Also, per Dr. Venetia Maxon, ok to r/s home meds to start tomorrow.  Georgiann Cocker, RN, BSN

## 2011-09-13 NOTE — Progress Notes (Signed)
UR COMPLETED  

## 2011-09-13 NOTE — Preoperative (Signed)
Beta Blockers   Reason not to administer Beta Blockers:Not Applicable 

## 2011-09-13 NOTE — Anesthesia Preprocedure Evaluation (Addendum)
Anesthesia Evaluation  Patient identified by MRN, date of birth, ID band Patient awake    Reviewed: Allergy & Precautions, H&P , NPO status , Patient's Chart, lab work & pertinent test results  History of Anesthesia Complications Negative for: history of anesthetic complications  Airway Mallampati: I TM Distance: >3 FB Neck ROM: Full    Dental  (+) Teeth Intact, Caps and Dental Advisory Given   Pulmonary asthma ,  breath sounds clear to auscultation  Pulmonary exam normal       Cardiovascular hypertension, Pt. on medications Rhythm:Regular Rate:Normal     Neuro/Psych PSYCHIATRIC DISORDERS Anxiety Depression negative neurological ROS     GI/Hepatic Neg liver ROS, GERD-  Controlled,  Endo/Other  Morbid obesity  Renal/GU negative Renal ROS     Musculoskeletal  (+) Arthritis -, Osteoarthritis,    Abdominal   Peds  Hematology negative hematology ROS (+)   Anesthesia Other Findings   Reproductive/Obstetrics                          Anesthesia Physical Anesthesia Plan  ASA: III  Anesthesia Plan: General   Post-op Pain Management:    Induction: Intravenous  Airway Management Planned: Oral ETT  Additional Equipment:   Intra-op Plan:   Post-operative Plan: Extubation in OR  Informed Consent: I have reviewed the patients History and Physical, chart, labs and discussed the procedure including the risks, benefits and alternatives for the proposed anesthesia with the patient or authorized representative who has indicated his/her understanding and acceptance.   Dental advisory given  Plan Discussed with: CRNA, Anesthesiologist and Surgeon  Anesthesia Plan Comments:         Anesthesia Quick Evaluation

## 2011-09-13 NOTE — Progress Notes (Signed)
Awake, alert, conversant.  Full strength bilateral lower extremities.  Doing well. Notes some numbness in back of thighs.

## 2011-09-13 NOTE — Anesthesia Postprocedure Evaluation (Signed)
Anesthesia Post Note  Patient: [name]  Procedure(s) Performed: Procedure(s) (LRB): POSTERIOR LUMBAR FUSION 4 LEVEL (N/A) KYPHOPLASTY (N/A)  Anesthesia type: general  Patient location: PACU  Post pain: Pain level controlled  Post assessment: Patient's Cardiovascular Status Stable  Last Vitals:  Filed Vitals:   09/13/11 1559  BP:   Pulse:   Temp: 36.7 C  Resp:     Post vital signs: Reviewed and stable  Level of consciousness: sedated  Complications: No apparent anesthesia complications

## 2011-09-13 NOTE — Progress Notes (Signed)
Agree with above 

## 2011-09-13 NOTE — Op Note (Signed)
09/13/2011  11:53 AM  PATIENT:  [name]  66 y.o. female  PRE-OPERATIVE DIAGNOSIS:  Lumbar one Fracture, Pathologic fracture, Kyphosis, Osteoporosis, cord compression  POST-OPERATIVE DIAGNOSIS:  Lumbar one Fracture, Pathologic fracture, Kyphosis, Osteoporosis, cord compression  PROCEDURE:  Procedure(s) (LRB): POSTERIOR LUMBAR Decompression T11-L2 with Pedicle screw fixation T10-L3 with Posterolateral arthrodesis (N/A) KYPHOPLASTY (N/A)  SURGEON:  Surgeon(s) and Role:    * Maeola Harman, MD - Primary    * Reinaldo Meeker, MD - Assisting  PHYSICIAN ASSISTANT:   ASSISTANTS: P[place], RN   ANESTHESIA:   general  EBL:  Total I/O In: 2600 [I.V.:2200; Blood:150; IV Piggyback:250] Out: 825 [Urine:175; Blood:650]  BLOOD ADMINISTERED:150 CC PRBC  DRAINS: (Medium) Hemovact drain(s) in the epidural space with  Suction Open   LOCAL MEDICATIONS USED:  LIDOCAINE   SPECIMEN:  No Specimen  DISPOSITION OF SPECIMEN:  N/A  COUNTS:  YES  TOURNIQUET:  * No tourniquets in log *  DICTATION: Patient is 66 year old woman with L1 fracture and osteoporosis, status post kyphoplasty of L1 with progressive kyphosis and stenosis. She has severe back pain and bilateral lower extremity pain. It was elected to take her to surgery for decompression and fusion at this level.   Procedure: Patient was placed in a prone position on the [place] table after smooth and uncomplicated induction of general endotracheal anesthesia. Her low back was prepped and draped in usual sterile fashion with DuraPrep. Area of incision was infiltrated with local lidocaine. Incision was made to the lumbodorsal fascia was incised and exposure was performed of the T10-L3 spinous processes laminae facet joint and transverse processes. Intraoperative x-ray was obtained which confirmed correct orientation. A total laminectomy of L1 was performed decompression of the spinal cord at this level and thorough decompression was performed of   nerve roots along with the common dural tube.  The posterolateral region was extensively decorticated and pedicle probes were placed at T10, T11, T12, L2, L3  bilaterally. Intraoperative fluoroscopy confirmed correct orientationin the AP and lateral plane. 40 x 6.5 mm pedicle screws were placed at T10, T11, T12 bilaterally and 45 x 6.5 mm screws placed at L3 bilaterally and 45 x 5.5 mm screws placed at L2 bilaterally.  This was done with injecting bone cement into the tapped screw holes at each level because of the patient's extremely poor bone quality. Intraoperative fluroscopy was used during this portion of the procedure. Final x-rays demonstrated well-positioned pedicle screw fixation. There seemed to be some extravasation of bone cement cephalad in the spinal canal, so laminectomy was extended to the level of T11 and the spinal cord and neural elements were confirmed to be decompressed. A 140 mm rod was placed on the right and a 150 mm rod was placed on the left locked down in situ with cross connector placed. The posterolateral region was packed with the large BMP and autograft and bone graft extender bilaterally. A medium Hemovac drain was placed through a separate incision.  Fascia was closed with 1 Vicryl sutures skin edges were reapproximated 2 and 3-0 Vicryl sutures. The wound is dressed with benzoin Steri-Strips Telfa gauze and tape the patient was extubated in the operating room and taken to recovery in stable satisfactory condition he tolerated traction well counts were correct at the end of the case.  PLAN OF CARE: Admit to inpatient   PATIENT DISPOSITION:  PACU - hemodynamically stable.   Delay start of Pharmacological VTE agent (>24hrs) due to surgical blood loss or risk of bleeding:  yes

## 2011-09-13 NOTE — Transfer of Care (Signed)
Immediate Anesthesia Transfer of Care Note  Patient: [name]  Procedure(s) Performed: Procedure(s) (LRB): POSTERIOR LUMBAR FUSION 4 LEVEL (N/A) KYPHOPLASTY (N/A)  Patient Location: PACU  Anesthesia Type: General  Level of Consciousness: awake, alert  and oriented  Airway & Oxygen Therapy: Patient Spontanous Breathing and Patient connected to nasal cannula oxygen  Post-op Assessment: Report given to PACU RN and Post -op Vital signs reviewed and stable  Post vital signs: Reviewed and stable  Complications: No apparent anesthesia complications

## 2011-09-13 NOTE — H&P (Signed)
[name]  #[id number] DOB:  1946/03/14 08/24/2011:  [name] returns today.  We were at last authorized by her insurance company to proceed with MRI and these show striking findings of significant compression of the spinal canal and distal spinal cord.  Comparing her preoperative MRI, there was 7.6 mm. of AP diameter in the spinal canal and now it is down to 7 mm.  She appears to have increased kyphosis as well as inferior endplate of T12 fracture. She is not getting better. She has to walk leaning forward. She notes numbness into her legs and does not appear to have focal weakness. She does complain of urgency going to the bathroom.   In light of these new changes, I told [name] that I think she should undergo posterior fixation and decompression of the spinal cord at the level of L1 with T10 through L3 fixation and possible kyphoplasty at T12.  I think that the progressive narrowing of the spinal canal is concerning. I do not think there is a conservative way to address this and the time that it has taken for her to get authorization for studies by her insurance company, she has not gotten any better and says that she has gradually been getting worse.    Risks and benefits were discussed with the patient and she wishes to proceed.  We fitted her for a TLSO brace today because of the level of fracture and immobilization in an LSO brace would not be adequate.  I also explained that we may need to do supplemental fixation with bone cement in addition to the screws to anchor them given her osteoporosis.    I gave her prescriptions for Methocarbamol and for Hydrocodone. I explained to her that I would do her surgery as soon as it would be authorized by her insurance company.  I anticipate that between our last communications that they will have developed more wisdom than they had previously exhibited in refusing her additional studies.  Since we are dealing with spinal cord issues and potential  neurologic deficits, I think that it will be very inappropriate for them to deny this recommended surgery. Should they do so, they will be practicing medicine without a license and will need to assume care for this patient since I am giving a considered medical opinion and they should be able to come up with a doctor who will do their bidding as I will not.           [name]. [name], M.D./[facility]   [name]. 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 She said she fell twice in July, once at the mailbox and one in the shower.  She tripped on a rock outside and then fell in the shower and believes that she hurt herself most when she fell in the shower.  She has been taking Baclofen 10 mg. at night, Lyrica 100 mg. two twice daily and has been doing physical therapy at home without any improvement.    REVIEW OF SYSTEMS:   A detailed Review of Systems sheet was reviewed with the patient.  Pertinent positives include wears glasses, glaucoma, hearing aid, balance disturbance, nasal congestion/drainage, high blood pressure, high cholesterol, asthma, incontinence, back pain, joint pain or swelling, arthritis, problems with memory, anxiety, depression, bleeding tendencies, food allergies,  inhalant (nasal) allergies.  All other systems are negative; this includes Constitutional symptoms, Eyes, Endocrine, Gastrointestinal, Integumentary & Breast, Lymphatic, Immunologic.    PAST MEDICAL HISTORY:      Current Medical Conditions:    She has a history hypertension, CVA, hiatal hernia, glaucoma, asthma, and bipolar for which she takes Lamictal with elevated cholesterol.      Prior Operations and Hospitalizations:   Significant for a hysterectomy in 1972, cholecystectomy in 1982, and bladder surgery in 2012.      Medications and Allergies:  She is  ALLERGIC TO PENICILLIN, SULFA, AND NEOSPORIN; LATEX AS WELL.  Medications - Bumetanide 2 mg. two qd prn, Baclofen 10 mg. one po q.h.s., Symbicort 160/4.5 two puffs b.i.d., Atorvastatin 20 mg. q.h.s., Clonazepam 0.25 mg. q.h.s. for b.i.d., Xopenex 45 mcg. two puffs every 4-6 hours prn, Lyrica 100 mg. two b.i.d., Fluticasone one spray in each nostril qd, Azopt 1% one drop b.i.d. in each eye, Dexilant 60 mg. qd, Nisoldipine 17 mg. qd, Lamotrigine 150 mg. one-half b.i.d., Trazodone 50 mg. one to two q.h.s., Lumigan 0.01% one drop in each eye q.h.s., and Alendronate 70 mg. one weekly.    Height and Weight:     She is 5' tall, 170 lbs.    FAMILY HISTORY:    Both parents are decreased with a history of stroke, heart attack and cancer.    SOCIAL HISTORY:    She denies tobacco or drug use. She is a social drinker of alcoholic beverages.    DIAGNOSTIC STUDIES:   Radiographs of the lumbar spine demonstrate an L1 fracture. These were obtained through [facility] when she initially presented and which show significant height loss at this level.    I sent [name] for repeat radiographs of the lumbar spine which show that she has a markedly progressed L1 compression fracture.    PHYSICAL EXAMINATION:      General Appearance:   On examination today, [name] is a pleasant and cooperative woman in no acute distress.      Blood Pressure, Pulse:     Her blood pressure is 138/80.  Heart rate is 64 and regular.  Respiratory rate is 18.      HEENT - normocephalic, atraumatic.  The pupils are equal, round and reactive to light.  The extraocular muscles are intact.  Sclerae - white.  Conjunctiva - pink.  Oropharynx benign.  Uvula midline.     Neck - there are no masses, meningismus, deformities, tracheal deviation, jugular vein distention or carotid bruits.  There is normal cervical range of motion.  Spurlings' test is negative without reproducible radicular pain turning the patient's head to either side.   Lhermitte's sign is not present with axial compression.      Respiratory - there is normal respiratory effort with good intercostal function.  Lungs are clear to auscultation.  There are no rales, rhonchi or wheezes.      Cardiovascular - the heart has regular rate and rhythm to auscultation.  No murmurs are appreciated.  There is no extremity edema, cyanosis or clubbing.  There are palpable pedal pulses.      Abdomen - soft, nontender, no hepatosplenomegaly appreciated or masses.  There are active bowel sounds.  No guarding or rebound.     Musculoskeletal Examination - she has pain at the thoracolumbar junction and at the lumbosacral junction to palpation. She is unable to stand without significant discomfort.  She is leaning forward and using a walker.  NEUROLOGICAL EXAMINATION: The patient is oriented to time, person and place and has good recall of both recent and remote memory with normal attention span and concentration.  The patient speaks with clear and fluent speech and exhibits normal language function and appropriate fund of knowledge.      Cranial Nerve Examination - pupils are equal, round and reactive to light.  Extraocular movements are full.  Visual fields are full to confrontational testing.  Facial sensation and facial movement are symmetric and intact.  Hearing is intact to finger rub.  Palate is upgoing.  Shoulder shrug is symmetric.  Tongue protrudes in the midline.      Motor Examination - motor strength is 5/5 in the bilateral deltoids, biceps, triceps, handgrips, wrist extensors, interosseous.  In the lower extremities motor strength is 5/5 in hip flexion, extension, quadriceps, hamstrings, plantar flexion, dorsiflexion and extensor hallucis longus.      Sensory Examination - she has decreased pin sensation in both of her feet to the level of her ankles and has been told that she has neuropathy in both of her feet.  She is complaining of some pain into the right groin.       Deep Tendon Reflexes - 3 in the biceps, triceps, and brachioradialis, 3 in the knees, 3 in the ankles.  The great toes are downgoing to plantar stimulation.      Cerebellar Examination - normal coordination in upper and lower extremities and normal rapid alternating movements.  Romberg test is negative.    IMPRESSION AND RECOMMENDATIONS: I have recommended that we proceed with a lumbar MRI to better clarify if there is any evidence of retropulsion of bone in the spinal canal and assuming that this is acceptable, we will then proceed with an L1 kyphoplasty procedure on 01/04/2011.  Her diagnoses are osteoporosis, L1 fracture, and kyphosis secondary to this new compression fracture.  Risks and benefits were discussed with the patient and she wishes to proceed. We will go ahead with an MRI to make sure that there is no significant retropulsion of bone in the spinal canal.     [facility]   [name]. [name], M.D.

## 2011-09-13 NOTE — Interval H&P Note (Signed)
History and Physical Interval Note:  09/13/2011 6:53 AM  [name]  has presented today for surgery, with the diagnosis of L1 Fracture, Pathologic fracture, Kyphosis, Osteoporosis  The various methods of treatment have been discussed with the patient and family. After consideration of risks, benefits and other options for treatment, the patient has consented to  Procedure(s) (LRB): POSTERIOR LUMBAR FUSION 4 LEVEL (N/A) KYPHOPLASTY (N/A) as a surgical intervention .  The patient's history has been reviewed, patient examined, no change in status, stable for surgery.  I have reviewed the patients' chart and labs.  Questions were answered to the patient's satisfaction.     [name]  Date of Initial H&P: 08/24/2011  History reviewed, patient examined, no change in status, stable for surgery.

## 2011-09-14 ENCOUNTER — Inpatient Hospital Stay (HOSPITAL_COMMUNITY): Payer: Medicare Other

## 2011-09-14 DIAGNOSIS — Z9889 Other specified postprocedural states: Secondary | ICD-10-CM

## 2011-09-14 DIAGNOSIS — I959 Hypotension, unspecified: Secondary | ICD-10-CM

## 2011-09-14 DIAGNOSIS — G9520 Unspecified cord compression: Secondary | ICD-10-CM

## 2011-09-14 DIAGNOSIS — E860 Dehydration: Secondary | ICD-10-CM

## 2011-09-14 DIAGNOSIS — R34 Anuria and oliguria: Secondary | ICD-10-CM

## 2011-09-14 DIAGNOSIS — G959 Disease of spinal cord, unspecified: Secondary | ICD-10-CM

## 2011-09-14 LAB — CBC
HCT: 27.7 % — ABNORMAL LOW (ref 36.0–46.0)
HCT: 25.8 % — ABNORMAL LOW (ref 36.0–46.0)
MCHC: 32.2 g/dL (ref 30.0–36.0)
MCV: 89.1 fL (ref 78.0–100.0)
MCV: 89.3 fL (ref 78.0–100.0)
RDW: 14.3 % (ref 11.5–15.5)
RDW: 14.4 % (ref 11.5–15.5)
WBC: 9.1 10*3/uL (ref 4.0–10.5)

## 2011-09-14 LAB — BASIC METABOLIC PANEL
BUN: 19 mg/dL (ref 6–23)
BUN: 21 mg/dL (ref 6–23)
Calcium: 7.5 mg/dL — ABNORMAL LOW (ref 8.4–10.5)
Chloride: 102 mEq/L (ref 96–112)
Creatinine, Ser: 1.94 mg/dL — ABNORMAL HIGH (ref 0.50–1.10)
Creatinine, Ser: 1.95 mg/dL — ABNORMAL HIGH (ref 0.50–1.10)
GFR calc Af Amer: 30 mL/min — ABNORMAL LOW (ref >90)
GFR calc Af Amer: 29 mL/min — ABNORMAL LOW (ref >90)
GFR calc non Af Amer: 25 mL/min — ABNORMAL LOW (ref >90)
Potassium: 4.5 mEq/L (ref 3.5–5.1)

## 2011-09-14 MED ORDER — ALBUTEROL SULFATE (5 MG/ML) 0.5% IN NEBU
2.5000 mg | INHALATION_SOLUTION | Freq: Once | RESPIRATORY_TRACT | Status: AC
  Administered 2011-09-14: 2.5 mg via RESPIRATORY_TRACT

## 2011-09-14 MED ORDER — SODIUM CHLORIDE 0.9 % IV BOLUS (SEPSIS)
500.0000 mL | Freq: Once | INTRAVENOUS | Status: AC
  Administered 2011-09-14: 500 mL via INTRAVENOUS

## 2011-09-14 MED ORDER — IPRATROPIUM BROMIDE 0.02 % IN SOLN
0.5000 mg | Freq: Four times a day (QID) | RESPIRATORY_TRACT | Status: DC
  Administered 2011-09-14 – 2011-09-17 (×11): 0.5 mg via RESPIRATORY_TRACT
  Filled 2011-09-14 (×12): qty 2.5

## 2011-09-14 MED ORDER — IPRATROPIUM BROMIDE 0.02 % IN SOLN
0.5000 mg | Freq: Once | RESPIRATORY_TRACT | Status: AC
  Administered 2011-09-14: 0.5 mg via RESPIRATORY_TRACT

## 2011-09-14 MED ORDER — NOREPINEPHRINE BITARTRATE 1 MG/ML IJ SOLN
2.0000 ug/min | INTRAMUSCULAR | Status: DC
  Administered 2011-09-14: 15 ug/min via INTRAVENOUS
  Administered 2011-09-14: 5 ug/min via INTRAVENOUS
  Administered 2011-09-15 (×2): 10 ug/min via INTRAVENOUS
  Administered 2011-09-15: 15 ug/min via INTRAVENOUS
  Administered 2011-09-16: 10 ug/min via INTRAVENOUS
  Filled 2011-09-14 (×7): qty 4

## 2011-09-14 MED ORDER — HYDROMORPHONE HCL PF 1 MG/ML IJ SOLN
0.5000 mg | Freq: Once | INTRAMUSCULAR | Status: AC
  Administered 2011-09-14: 0.5 mg via INTRAVENOUS

## 2011-09-14 MED ORDER — ALBUTEROL SULFATE (5 MG/ML) 0.5% IN NEBU
INHALATION_SOLUTION | RESPIRATORY_TRACT | Status: AC
  Administered 2011-09-14: 2.5 mg via RESPIRATORY_TRACT
  Filled 2011-09-14: qty 0.5

## 2011-09-14 MED ORDER — LACTATED RINGERS IV BOLUS (SEPSIS)
300.0000 mL | Freq: Once | INTRAVENOUS | Status: AC
  Administered 2011-09-14: 300 mL via INTRAVENOUS

## 2011-09-14 MED ORDER — BUDESONIDE 0.25 MG/2ML IN SUSP
0.2500 mg | Freq: Four times a day (QID) | RESPIRATORY_TRACT | Status: DC
  Administered 2011-09-14 – 2011-09-17 (×12): 0.25 mg via RESPIRATORY_TRACT
  Filled 2011-09-14 (×17): qty 2

## 2011-09-14 MED ORDER — SODIUM CHLORIDE 0.9 % IV BOLUS (SEPSIS)
1000.0000 mL | Freq: Once | INTRAVENOUS | Status: AC
Start: 2011-09-14 — End: 2011-09-14
  Administered 2011-09-14: 1000 mL via INTRAVENOUS

## 2011-09-14 MED ORDER — ALBUTEROL SULFATE (5 MG/ML) 0.5% IN NEBU
2.5000 mg | INHALATION_SOLUTION | Freq: Four times a day (QID) | RESPIRATORY_TRACT | Status: DC
  Administered 2011-09-14 – 2011-09-17 (×11): 2.5 mg via RESPIRATORY_TRACT
  Filled 2011-09-14 (×12): qty 0.5

## 2011-09-14 MED ORDER — OXYCODONE-ACETAMINOPHEN 5-325 MG PO TABS
1.0000 | ORAL_TABLET | ORAL | Status: DC | PRN
  Administered 2011-09-14: 1 via ORAL
  Filled 2011-09-14: qty 1

## 2011-09-14 MED ORDER — HYDROMORPHONE HCL PF 1 MG/ML IJ SOLN
INTRAMUSCULAR | Status: AC
  Filled 2011-09-14: qty 1

## 2011-09-14 MED ORDER — IPRATROPIUM BROMIDE 0.02 % IN SOLN
RESPIRATORY_TRACT | Status: AC
  Administered 2011-09-14: 0.5 mg via RESPIRATORY_TRACT
  Filled 2011-09-14: qty 2.5

## 2011-09-14 MED ORDER — ALBUTEROL SULFATE (5 MG/ML) 0.5% IN NEBU
2.5000 mg | INHALATION_SOLUTION | RESPIRATORY_TRACT | Status: DC | PRN
  Administered 2011-09-19 – 2011-09-22 (×6): 2.5 mg via RESPIRATORY_TRACT
  Filled 2011-09-14 (×6): qty 0.5

## 2011-09-14 NOTE — Clinical Documentation Improvement (Signed)
Anemia Blood Loss Clarification  THIS DOCUMENT IS NOT A PERMANENT PART OF THE MEDICAL RECORD         09/14/11  Dear Dr.Feinstein/Associates  In an effort to better capture your patient's severity of illness, reflect appropriate length of stay and utilization of resources, a review of the patient medical record has revealed the following indicators.   Based on your clinical judgment, please clarify and document in a progress note and/or discharge summary the clinical condition associated with the following supporting information: In responding to this query please exercise your independent judgment.  The fact that a query is asked, does not imply that any particular answer is desired or expected.   Hi Dr. Tyson Alias!  Patient admitted with L1 fracture, kyphosis and cord compression. Surgery on 6/25 for treatment. Per op note "EBL =  " and " of PRBCs" given intraoperatively. If possible, please help clarify the suspected diagnosis causing the H&H = 8.3/25.8 and possibly causing the hypotension. Thank you.  Possible Clinical Conditions?  - Expected Intraoperative Acute Blood Loss Anemia  - Acute Blood Loss Anemia  - Other condition (please document in the progress notes and/or discharge summary)  - Cannot Clinically determine at this time    Supporting Information:  - 6/28 and 6/29 PRBCs administered IV.  Component      Hemoglobin HCT  Latest Ref Rng      12.0 - 15.0 g/dL 16.1 - 09.6 %  0/45/4098      13.0 39.0  09/14/2011     4:50 AM 8.8 (L) 27.7 (L)  09/14/2011     9:25 AM 8.3 (L) 25.8 (L)  09/15/2011      7.7 (L) 23.0 (L)  09/16/2011      7.0 (L) 21.1 (L)  09/17/2011      7.7 (L) 23.1 (L)      No additional documentation in chart upon review. SM    Thank You,  Saul Fordyce  Clinical Documentation Specialist: [phone] Pager  Health Information Management [place]

## 2011-09-14 NOTE — Evaluation (Signed)
Occupational Therapy Evaluation Patient Details Name: [name] MRN: [id number] DOB: [birth year] Today's Date: 09/14/2011 Time: 6440-3474 OT Time Calculation (min): 50 min  OT Assessment / Plan / Recommendation Clinical Impression  This T11-L2 with Pedicle screw fixation T10-L3 with Posterolateral arthrodesis, due to kyphosis, osteoporosis, h/o compression fracture and cord compression.  Pt. with pain 9/10 during eval, but is motivated to participate in therapies.  Pt. requires total A for all LB ADLs, and total A +2 for mobility this date.  She will benefit from OT to maximize safety and independence with BADLs to allow pt. to return home at modified independent level after SNF level rehab.       OT Assessment  Patient needs continued OT Services    Follow Up Recommendations  Skilled nursing facility    Barriers to Discharge Decreased caregiver support    Equipment Recommendations  Defer to next venue    Recommendations for Other Services    Frequency  Min 2X/week    Precautions / Restrictions Precautions Precautions: Back Precaution Comments: Educated on 3/3 back precautions Required Braces or Orthoses: Spinal Brace Spinal Brace: Applied in sitting position       ADL  Eating/Feeding: Performed;Minimal assistance (pt frequently dropping utensil.  Built up handle provided) Where Assessed - Eating/Feeding: Chair Grooming: Simulated;Wash/dry hands;Wash/dry face;Teeth care;Minimal assistance Where Assessed - Grooming: Supported sitting Upper Body Bathing: Simulated;Moderate assistance Where Assessed - Upper Body Bathing: Supported sitting Lower Body Bathing: Simulated;Maximal assistance Where Assessed - Lower Body Bathing: Supported sit to stand Upper Body Dressing: Simulated;Maximal assistance Where Assessed - Upper Body Dressing: Unsupported sitting Lower Body Dressing: Simulated;+1 Total assistance Where Assessed - Lower Body Dressing: Supported sit to stand Toilet  Transfer: Simulated;+2 Total assistance Toilet Transfer: Patient Percentage: 40% Statistician Method: Surveyor, minerals: Materials engineer and Hygiene: Simulated;+1 Total assistance Where Assessed - Engineer, mining and Hygiene: Standing Equipment Used: Back brace Transfers/Ambulation Related to ADLs: Pt. performed stand pivot transfer with Total A +2 (pt. ~40%).  Pt. with strong posterior lean ADL Comments: Pt. with tremulous bil. UEs this date.  Pt. is motivated, but is moving slowly.  She reports she plans to discharge to [name]    OT Diagnosis: Generalized weakness;Acute pain  OT Problem List: Decreased strength;Decreased activity tolerance;Impaired balance (sitting and/or standing);Decreased cognition;Decreased knowledge of use of DME or AE;Decreased knowledge of precautions;Pain OT Treatment Interventions: Self-care/ADL training;DME and/or AE instruction;Therapeutic activities;Patient/family education;Balance training   OT Goals Acute Rehab OT Goals OT Goal Formulation: With patient Time For Goal Achievement: 09/21/11 Potential to Achieve Goals: Good ADL Goals Pt Will Perform Upper Body Bathing: with supervision;Sitting, chair ADL Goal: Upper Body Bathing - Progress: Goal set today Pt Will Perform Lower Body Bathing: with min assist;Sit to stand from chair ADL Goal: Lower Body Bathing - Progress: Goal set today Pt Will Transfer to Toilet: with min assist;Ambulation;Comfort height toilet ADL Goal: Toilet Transfer - Progress: Goal set today Pt Will Perform Toileting - Clothing Manipulation: with min assist;Standing ADL Goal: Toileting - Clothing Manipulation - Progress: Goal set today Additional ADL Goal #1: Pt. will be independent with back precautions ADL Goal: Additional Goal #1 - Progress: Goal set today  Visit Information  Last OT Received On: 09/14/11 Assistance Needed: +2 PT/OT  Co-Evaluation/Treatment: Yes    Subjective Data  Subjective: "I'm in terrible pain" Patient Stated Goal: "I've got to get back on my feet and do everything for myself like I was"  Prior Functioning  Home Living Lives With: Alone Available Help at Discharge:  (none) Type of Home: House Home Access: Stairs to enter [employer] of Steps: 2 Entrance Stairs-Rails: None Home Layout: Laundry or work area in basement;Two level Alternate Level Stairs-Number of Steps: 13 Alternate Level Stairs-Rails: Left Bathroom Shower/Tub: Tub/shower unit;Curtain Bathroom Toilet: Standard Bathroom Accessibility: Yes How Accessible: Accessible via walker Home Adaptive Equipment: Walker - rolling Additional Comments: Pt. is planning to discharge to SNF Prior Function Level of Independence: Independent with assistive device(s) Able to Take Stairs?: Yes Driving: Yes Vocation: Retired Musician: No difficulties Dominant Hand: Right    Cognition  Overall Cognitive Status: Impaired Area of Impairment: Problem solving Arousal/Alertness: Awake/alert Orientation Level: Appears intact for tasks assessed Behavior During Session: [facility] for tasks performed Problem Solving: Slow to process at times and needs extra time to complete task    Extremity/Trunk Assessment Right Upper Extremity Assessment RUE ROM/Strength/Tone: Within functional levels RUE Coordination: Deficits RUE Coordination Deficits: Rt. UE tremulous and pt. dropping items Left Upper Extremity Assessment LUE ROM/Strength/Tone: Deficits LUE ROM/Strength/Tone Deficits: Pre-morbid limitations with shoulder elevation to 90 LUE Coordination: Deficits LUE Coordination Deficits: tremulous Right Lower Extremity Assessment RLE ROM/Strength/Tone: Unable to fully assess;Due to pain RLE Sensation: WFL - Light Touch Left Lower Extremity Assessment LLE ROM/Strength/Tone: Unable to fully assess;Due to pain LLE Sensation: WFL  - Light Touch   Mobility Bed Mobility Bed Mobility: Rolling Right;Right Sidelying to Sit;Sitting - Scoot to [employer] of Bed Rolling Right: 1: +2 Total assist Rolling Right: Patient Percentage: 10% Right Sidelying to Sit: 1: +2 Total assist Right Sidelying to Sit: Patient Percentage: 40% Sitting - Scoot to Edge of Bed: 2: Max assist Details for Bed Mobility Assistance: +2 with bed mobility to initiate and complete with max cues for proper technique to prevent twisting.  (A) to elevate trunk OOB and LE OOB. Transfers Sit to Stand: 1: +2 Total assist;From bed Sit to Stand: Patient Percentage: 40% Stand to Sit: 1: +2 Total assist;To chair/3-in-1 Stand to Sit: Patient Percentage: 40% Details for Transfer Assistance: +2(A) to maintain balance with HHAx2 during stand pivot transfer;  Pt slow to move and needs extra time to complete;  Max manual and VCs for proper step sequence and weight shift to advance LE toward chair   Exercise    Balance Balance Balance Assessed: Yes Static Sitting Balance Static Sitting - Balance Support: Feet supported Static Sitting - Level of Assistance: 3: Mod assist Static Sitting - Comment/# of Minutes: Intermittent mod (A) due to posterior lean and overall pain;  Max cues for upright posture  End of Session OT - End of Session Equipment Utilized During Treatment: Back brace Activity Tolerance: Patient limited by pain Patient left: in chair;with call bell/phone within reach;with nursing in room Nurse Communication: Mobility status;Patient requests pain meds  GO Functional Limitation: Self care   Zach Tietje, Ursula Alert M 09/14/2011, 3:29 PM

## 2011-09-14 NOTE — Progress Notes (Signed)
Dr. Gerlene Fee notified pt still has not had any urine output. CCM consult for diuretic vs fluid options. Will continue to monitor.

## 2011-09-14 NOTE — Progress Notes (Addendum)
Name: [name] MRN: [id number] DOB: 03-08-1946    LOS: 1  Referring Provider:  Ricka Burdock Reason for Referral:  Hypotension / anuria  PULMONARY / CRITICAL CARE MEDICINE  HPI:  66 yo with lumbar spinal fracture / spinal cord compression s/p decompression / fixation (intraoperative blood loss 650 mL) with hypotension / anuria after surgery.  Foley catheter has been replaced with minimal urine output.  Bladder scan demonstrated empty bladder.  Patient is thirsty.  Reports some bladder pressure.  Reports postop pain is fairly controlled.   Brief patient description:  66 yo with lumbar spinal fracture / spinal cord compression s/p decompression / fixation (intraoperative blood loss 650 mL) with hypotension / anuria after surgery.  Foley catheter has been replaced with minimal urine output.  Bladder scan demonstrated empty bladder.  Patient is thirsty.  Reports postop pain is fairly controlled.  Events Since Admission: 6/25  Spinal surgery, mild hypotension / decreased urine output postoperartively  Current Status: Awake and alert Vital Signs: Temp:  [97 F (36.1 C)-98.8 F (37.1 C)] 98 F (36.7 C) (06/26 0800) Pulse Rate:  [55-83] 63  (06/26 0600) Resp:  [8-18] 9  (06/26 0600) BP: (82-136)/(40-97) 100/54 mmHg (06/26 0600) SpO2:  [92 %-100 %] 100 % (06/26 0600) Weight:  [187 lb 2.7 oz (84.9 kg)] 187 lb 2.7 oz (84.9 kg) (06/25 1400) Intake/Output      06/25 0701 - 06/26 0700 06/26 0701 - 06/27 0700   I.V. (mL/kg) 3580.3 (42.2)    Blood 150    IV Piggyback 2950    Total Intake(mL/kg) 6680.3 (78.7)    Urine (mL/kg/hr) 325 (0.2)    Drains 630    Blood 650    Total Output 1605    Net +5075.3          Physical Examination: General:  No acute distress, anxcious Neuro:  Awake, alert, cooperative HEENT:  PERRL, dry mucus membranes Neck:  No JVD Cardiovascular:  RRR, no murmurs Lungs:  CTAB , mild exp wheeze Abdomen:  Soft, nontender, bowel sounds present Musculoskeletal:  No edema,  moves all extremities Skin:  Decreased turgor  Labs:  Lab 09/14/11 0450 09/12/11 1410  HGB 8.8* 13.0  HCT 27.7* 39.0  WBC 9.1 6.3  PLT 106* 139*  NA 138 140  K 4.8 3.8  CL 102 101  CO2 26 25  GLUCOSE 127* 125*  BUN 19 17  CREATININE 1.94* 0.91  CALCIUM 7.9* 9.3  MG -- --  PHOS -- --  AST -- --  ALT -- --  ALKPHOS -- --  BILITOT -- --  PROT -- --  ALBUMIN -- --  INR -- --  APTT -- --  INR -- --  LATICACIDVEN -- --  TROPONINI -- --   ASSESSMENT AND PLAN  Osteoporosis, pathologic spinal fracture, spinal cord compression, s/p decompression / fixation Hypotension / oliguria Positive fluid balance but clinically dry Doubt spinal or septic shock  -->  NS 1000 mL fluid boluses , place cvl and keep cvp>8<12 -->  Goal MAP 60-65 -->  No indictaion for vasopressors at this time.  -->  Postop care per neurosurgery -->  Strict I/O -->  CBC/BMP --> consider 2 d echo if cvp high  Brett Canales Minor ACNP Adolph Pollack PCCM Pager [phone] till 3 pm If no answer page [phone] 09/14/2011, 8:32 AM  Seen on CCM rounds this morning with resident MD or ACNP above.  Pt examined and database reviewed. I agree with above findings, assessment and plan  as reflected in the note above. 30 mins CCM time  Billy Fischer, MD;  PCCM service; Mobile [phone]

## 2011-09-14 NOTE — Progress Notes (Signed)
Name: [name] MRN: [id number] DOB: 14-Jul-1945  ELECTRONIC ICU PHYSICIAN NOTE  Problem:  No uop post op, foley already replaced, bp low  Intervention:  NS x 500 cc PCCM service to evaluate  Sandrea Hughs 09/14/2011, 4:02 AM

## 2011-09-14 NOTE — Evaluation (Signed)
Physical Therapy Evaluation Patient Details Name: [name] MRN: [id number] DOB: 1945-07-24 Today's Date: 09/14/2011 Time: 4098-1191 PT Time Calculation (min): 49 min  PT Assessment / Plan / Recommendation Clinical Impression  Pt is 66 y/o female admitted for s/p T11-L2 with Pedicle screw fixation T10-L3 with Posterolateral arthrodesis.  Pt will benefit from acute PT services to improve overall mobility and prepare for safe d/c to next veune.    PT Assessment  Patient needs continued PT services    Follow Up Recommendations  Skilled nursing facility    Barriers to Discharge        Equipment Recommendations  Defer to next venue    Recommendations for Other Services     Frequency Min 5X/week    Precautions / Restrictions Precautions Precautions: Back Precaution Comments: Educated on 3/3 back precautions Required Braces or Orthoses: Spinal Brace Spinal Brace: Applied in sitting position   Pertinent Vitals/Pain 8/10 back pain      Mobility  Bed Mobility Bed Mobility: Rolling Right;Right Sidelying to Sit;Sitting - Scoot to [employer] of Bed Rolling Right: 1: +2 Total assist Rolling Right: Patient Percentage: 10% Right Sidelying to Sit: 1: +2 Total assist Right Sidelying to Sit: Patient Percentage: 40% Sitting - Scoot to Edge of Bed: 2: Max assist Details for Bed Mobility Assistance: +2 with bed mobility to initiate and complete with max cues for proper technique to prevent twisting.  (A) to elevate trunk OOB and LE OOB. Transfers Transfers: Sit to Stand;Stand to Sit Sit to Stand: 1: +2 Total assist;From bed Sit to Stand: Patient Percentage: 40% Stand to Sit: 1: +2 Total assist;To chair/3-in-1 Stand to Sit: Patient Percentage: 40% Stand Pivot Transfers: 1: +2 Total assist Stand Pivot Transfers: Patient Percentage: 40% Details for Transfer Assistance: +2(A) to maintain balance with HHAx2 during stand pivot transfer;  Pt slow to move and needs extra time to complete;  Max  manual and VCs for proper step sequence and weight shift to advance LE toward chair    Exercises     PT Diagnosis: Difficulty walking;Abnormality of gait;Generalized weakness;Acute pain  PT Problem List: Decreased strength;Decreased activity tolerance;Decreased balance;Decreased mobility;Decreased knowledge of use of DME;Pain PT Treatment Interventions: DME instruction;Gait training;Functional mobility training;Therapeutic activities;Therapeutic exercise;Balance training;Neuromuscular re-education;Patient/family education   PT Goals Acute Rehab PT Goals PT Goal Formulation: With patient Time For Goal Achievement: 09/28/11 Potential to Achieve Goals: Good Pt will Roll Supine to Right Side: with min assist PT Goal: Rolling Supine to Right Side - Progress: Goal set today Pt will go Supine/Side to Sit: with min assist PT Goal: Supine/Side to Sit - Progress: Goal set today Pt will go Sit to Supine/Side: with min assist PT Goal: Sit to Supine/Side - Progress: Goal set today Pt will go Sit to Stand: with min assist PT Goal: Sit to Stand - Progress: Goal set today Pt will go Stand to Sit: with min assist PT Goal: Stand to Sit - Progress: Goal set today Pt will Transfer Bed to Chair/Chair to Bed: with min assist PT Transfer Goal: Bed to Chair/Chair to Bed - Progress: Goal set today Pt will Stand: with supervision;1 - 2 min PT Goal: Stand - Progress: Goal set today Pt will Ambulate: 16 - 50 feet;with min assist;with rolling walker PT Goal: Ambulate - Progress: Goal set today  Visit Information  Last PT Received On: 09/14/11 Assistance Needed: +2 PT/OT Co-Evaluation/Treatment: Yes    Subjective Data  Subjective: "I'll do what you need me to do." Patient Stated Goal: To eventually  go home and become independent again.   Prior Functioning  Home Living Lives With: Alone Available Help at Discharge:  (none) Type of Home: House Home Access: Stairs to enter [employer] of  Steps: 2 Entrance Stairs-Rails: None Home Layout: Laundry or work area in basement;Two level Alternate Level Stairs-Number of Steps: 13 Alternate Level Stairs-Rails: Left Bathroom Shower/Tub: Tub/shower unit;Curtain Bathroom Toilet: Standard Bathroom Accessibility: Yes How Accessible: Accessible via walker Home Adaptive Equipment: Walker - rolling Additional Comments: Pt. is planning to discharge to SNF Prior Function Level of Independence: Independent with assistive device(s) Able to Take Stairs?: Yes Driving: Yes Vocation: Retired Musician: No difficulties Dominant Hand: Right    Cognition  Overall Cognitive Status: Impaired Area of Impairment: Problem solving Arousal/Alertness: Awake/alert Orientation Level: Appears intact for tasks assessed Behavior During Session: [facility] for tasks performed Problem Solving: Slow to process at times and needs extra time to complete task    Extremity/Trunk Assessment Right Upper Extremity Assessment RUE ROM/Strength/Tone: Within functional levels RUE Coordination: Deficits RUE Coordination Deficits: Rt. UE tremulous and pt. dropping items Left Upper Extremity Assessment LUE ROM/Strength/Tone: Deficits LUE ROM/Strength/Tone Deficits: Pre-morbid limitations with shoulder elevation to 90 LUE Coordination: Deficits LUE Coordination Deficits: tremulous Right Lower Extremity Assessment RLE ROM/Strength/Tone: Unable to fully assess;Due to pain RLE Sensation: WFL - Light Touch Left Lower Extremity Assessment LLE ROM/Strength/Tone: Unable to fully assess;Due to pain LLE Sensation: WFL - Light Touch   Balance Balance Balance Assessed: Yes Static Sitting Balance Static Sitting - Balance Support: Feet supported Static Sitting - Level of Assistance: 3: Mod assist Static Sitting - Comment/# of Minutes: Intermittent mod (A) due to posterior lean and overall pain;  Max cues for upright posture  End of Session PT - End of  Session Equipment Utilized During Treatment: Gait belt;Back brace Activity Tolerance: Patient limited by pain Patient left: in chair;with call bell/phone within reach Nurse Communication: Mobility status  GP     Yobani Schertzer 09/14/2011, 3:53 PM Jake Shark, PT DPT [phone]

## 2011-09-14 NOTE — Consult Note (Signed)
Name: [name] MRN: [id number] DOB: 08/09/1945    LOS: 1  Referring Provider:  Ricka Burdock Reason for Referral:  Hypotension / anuria  PULMONARY / CRITICAL CARE MEDICINE  HPI:  66 yo with lumbar spinal fracture / spinal cord compression s/p decompression / fixation (intraoperative blood loss 650 mL) with hypotension / anuria after surgery.  Foley catheter has been replaced with minimal urine output.  Bladder scan demonstrated empty bladder.  Patient is thirsty.  Reports some bladder pressure.  Reports postop pain is fairly controlled.  Past Medical History  Diagnosis Date  . GERD (gastroesophageal reflux disease)   . Peripheral vascular disease   . Cataracts, bilateral   . Glaucoma   . Asthma   . Depression   . Hypertension   . Arthritis   . Anxiety    Past Surgical History  Procedure Date  . Back surgery   . Bladder suspension     x3  . Knee arhtroscopy   . Hand / finger lesion excision    Prior to Admission medications   Medication Sig Start Date End Date Taking? Authorizing Provider  atorvastatin (LIPITOR) 20 MG tablet Take 20 mg by mouth daily.     Yes Historical Provider, MD  baclofen (LIORESAL) 10 MG tablet Take 10 mg by mouth at bedtime.     Yes Historical Provider, MD  bimatoprost (LUMIGAN) 0.01 % SOLN Place 1 drop into both eyes 2 (two) times daily.    Yes Historical Provider, MD  brinzolamide (AZOPT) 1 % ophthalmic suspension Place 1 drop into both eyes 2 (two) times daily.     Yes Historical Provider, MD  budesonide-formoterol (SYMBICORT) 160-4.5 MCG/ACT inhaler Inhale 1-2 puffs into the lungs 2 (two) times daily. 2 puffs every morning and 1 puff every evening   Yes Historical Provider, MD  bumetanide (BUMEX) 2 MG tablet Take 4 mg by mouth daily as needed. For swelling   Yes Historical Provider, MD  cetirizine (ZYRTEC) 10 MG tablet Take 10 mg by mouth daily.   Yes Historical Provider, MD  clonazePAM (KLONOPIN) 0.5 MG tablet Take 0.5 mg by mouth 2 (two) times daily.    Yes Historical Provider, MD  dexlansoprazole (DEXILANT) 60 MG capsule Take 60 mg by mouth every evening.    Yes Historical Provider, MD  FLUoxetine (PROZAC) 20 MG tablet Take 40 mg by mouth every morning. PT TAKES 2 CAPS FOR 40MG  DOSAGE   Yes Historical Provider, MD  fluticasone (FLONASE) 50 MCG/ACT nasal spray Place 2 sprays into the nose 2 (two) times daily.    Yes Historical Provider, MD  HYDROcodone-acetaminophen (NORCO) 5-325 MG per tablet Take 1 tablet by mouth every 4 (four) hours as needed. For pain   Yes Historical Provider, MD  lamoTRIgine (LAMICTAL) 150 MG tablet Take 75-150 mg by mouth 2 (two) times daily. Take 150 mg every morning and 75 mg every evening   Yes Historical Provider, MD  levalbuterol (XOPENEX HFA) 45 MCG/ACT inhaler Inhale 1-2 puffs into the lungs every 4 (four) hours as needed. For shortness of breath   Yes Historical Provider, MD  methocarbamol (ROBAXIN) 500 MG tablet Take 500 mg by mouth every 8 (eight) hours as needed. SPASMS   Yes Historical Provider, MD  nisoldipine (SULAR) 17 MG 24 hr tablet Take 17 mg by mouth every morning.     Yes Historical Provider, MD  oxyCODONE-acetaminophen (PERCOCET) 5-325 MG per tablet Take 1 tablet by mouth every 4 (four) hours as needed. PAIN    Yes  Historical Provider, MD  potassium chloride 20 MEQ/15ML (10%) SOLN Take 6.67 mEq by mouth daily. Mixed in water   Yes Historical Provider, MD  pregabalin (LYRICA) 100 MG capsule Take 100-400 mg by mouth 2 (two) times daily. 100 mg every morning and 400 mg every evening   Yes Historical Provider, MD  traZODone (DESYREL) 100 MG tablet Take 100 mg by mouth at bedtime.   Yes Historical Provider, MD   Allergies Allergies  Allergen Reactions  . Latex Hives  . Sulfa Antibiotics Shortness Of Breath  . Peanuts (Peanut Oil) Swelling  . Neosporin (Neomycin-Polymyxin-Gramicidin) Rash  . Penicillins Rash   Family History History reviewed. No pertinent family history. Social History  reports that  she has never smoked. She does not have any smokeless tobacco history on file. She reports that she drinks about .6 ounces of alcohol per week. She reports that she does not use illicit drugs.  Review Of Systems:  11 point negative, except as in HPI  Brief patient description:  67 yo with lumbar spinal fracture / spinal cord compression s/p decompression / fixation (intraoperative blood loss 650 mL) with hypotension / anuria after surgery.  Foley catheter has been replaced with minimal urine output.  Bladder scan demonstrated empty bladder.  Patient is thirsty.  Reports postop pain is fairly controlled.  Events Since Admission: 6/25  Spinal surgery, mild hypotension / decreased urine output postoperartively  Current Status:  Vital Signs: Temp:  [97 F (36.1 C)-98.8 F (37.1 C)] 98.6 F (37 C) (06/26 0404) Pulse Rate:  [55-83] 77  (06/26 0400) Resp:  [8-18] 10  (06/26 0400) BP: (86-136)/(40-97) 100/56 mmHg (06/26 0400) SpO2:  [92 %-100 %] 98 % (06/26 0400) Weight:  [84.9 kg (187 lb 2.7 oz)] 84.9 kg (187 lb 2.7 oz) (06/25 1400) Intake/Output      06/25 0701 - 06/26 0700   I.V. (mL/kg) 3505.3 (41.3)   Blood 150   IV Piggyback 1250   Total Intake(mL/kg) 4905.3 (57.8)   Urine (mL/kg/hr) 325 (0.2)   Drains 430   Blood 650   Total Output 1405   Net +3500.3        Physical Examination: General:  No acute distress, anxcious Neuro:  Awake, alert, cooperative HEENT:  PERRL, dry mucus membranes Neck:  No JVD Cardiovascular:  RRR, no murmurs Lungs:  CTAB Abdomen:  Soft, nontender, bowel sounds present Musculoskeletal:  No edema, moves all extremities Skin:  Decreased turgor  Labs:  Lab 09/12/11 1410  HGB 13.0  HCT 39.0  WBC 6.3  PLT 139*  NA 140  K 3.8  CL 101  CO2 25  GLUCOSE 125*  BUN 17  CREATININE 0.91  CALCIUM 9.3  MG --  PHOS --  AST --  ALT --  ALKPHOS --  BILITOT --  PROT --  ALBUMIN --  INR --  APTT --  INR --  LATICACIDVEN --  TROPONINI --    ASSESSMENT AND PLAN  Osteoporosis, pathologic spinal fracture, spinal cord compression, s/p decompression / fixation Hypotension / oliguria Positive fluid balance but clinically dry Doubt spinal or septic shock  -->  NS 1000 mL fluid boluses x 2 then evaluate -->  Goal MAP 60-65 -->  No indictaion for vasopressors at this timd -->  Postop care per neurosurgery -->  Strict I/O -->  CBC/BMP  Lonia Farber, M.D. Pulmonary and Critical Care Medicine [facility] Pager: [phone]  09/14/2011, 4:14 AM

## 2011-09-14 NOTE — Progress Notes (Signed)
Dr. Gerlene Fee informed pt has not had any urine output since 2000. 300 cc LR bolus ordered and foley irrigated. Will continue to monitor.

## 2011-09-14 NOTE — Progress Notes (Signed)
Name: [name] MRN: [id number] DOB: 04-Jan-1946  ELECTRONIC ICU PHYSICIAN NOTE  Problem:  Persistent anuiria  .  Lab 09/14/11 0450 09/12/11 1410  NA 138 140  K 4.8 3.8  CL 102 101  CO2 26 25  BUN 19 17  CREATININE 1.94* 0.91  GLUCOSE 127* 125*    Lab 09/14/11 0450 09/12/11 1410  HGB 8.8* 13.0  HCT 27.7* 39.0  WBC 9.1 6.3  PLT 106* 139*      Intake/Output Summary (Last 24 hours) at 09/14/11 0657 Last data filed at 09/14/11 0614  Gross per 24 hour  Intake 6680.25 ml  Output   1605 ml  Net 5075.25 ml     Intervention:  Renal ultrasound ordered  Sandrea Hughs 09/14/2011, 6:56 AM

## 2011-09-14 NOTE — Procedures (Signed)
Central Venous Catheter Insertion Procedure Note ASPYN WARNKE [id number] [birth year]  Procedure: Insertion of Central Venous Catheter Indications: Assessment of intravascular volume  Procedure Details Consent: Risks of procedure as well as the alternatives and risks of each were explained to the (patient/caregiver).  Consent for procedure obtained. Time Out: Verified patient identification, verified procedure, site/side was marked, verified correct patient position, special equipment/implants available, medications/allergies/relevent history reviewed, required imaging and test results available.  Performed  Maximum sterile technique was used including antiseptics, cap, gloves, gown, hand hygiene, mask and sheet. Skin prep: Chlorhexidine; local anesthetic administered A antimicrobial bonded/coated triple lumen catheter was placed in the left internal jugular vein using the Seldinger technique. Ultrasound guidance used.yes Catheter placed to 20 cm. Blood aspirated via all 3 ports and then flushed x 3. Line sutured x 2 and dressing applied.  Evaluation Blood flow good Complications: No apparent complications Patient did tolerate procedure well. Chest X-ray ordered to verify placement.  CXR: pending.  Brett Canales Minor ACNP [facility] PCC[facility] Pager [phone] till 3 pm If no answer page [phone] 09/14/2011, 8:29 AM    i was present for and supervised the entire procedure  Billy Fischer, MD ; [facility] service Mobile [phone].  After 5:30 PM or weekends, call [phone]

## 2011-09-14 NOTE — Progress Notes (Signed)
Subjective: Patient reports sore  Objective: Vital signs in last 24 hours: Temp:  [97 F (36.1 C)-98.8 F (37.1 C)] 98.6 F (37 C) (06/26 0404) Pulse Rate:  [55-83] 63  (06/26 0600) Resp:  [8-18] 9  (06/26 0600) BP: (82-136)/(40-97) 100/54 mmHg (06/26 0600) SpO2:  [92 %-100 %] 100 % (06/26 0600) Weight:  [84.9 kg (187 lb 2.7 oz)] 84.9 kg (187 lb 2.7 oz) (06/25 1400)  Intake/Output from previous day: 06/25 0701 - 06/26 0700 In: 6680.3 [I.V.:3580.3; Blood:150; IV Piggyback:2950] Out: 1605 [Urine:325; Drains:630; Blood:650] Intake/Output this shift: Total I/O In: 3375 [I.V.:675; IV Piggyback:2700] Out: 600 [Urine:150; Drains:450]  Physical Exam: Dressing CDI, strength full in lower extremities  Lab Results:  Basename 09/14/11 0450 09/12/11 1410  WBC 9.1 6.3  HGB 8.8* 13.0  HCT 27.7* 39.0  PLT 106* 139*   BMET  Basename 09/12/11 1410  NA 140  K 3.8  CL 101  CO2 25  GLUCOSE 125*  BUN 17  CREATININE 0.91  CALCIUM 9.3    Studies/Results: Dg Thoracolumabar Spine  09/13/2011  *RADIOLOGY REPORT*  Clinical Data: Back pain  DG C-ARM 61-120 MIN,THORACOLUMBAR SPINE - 2 VIEW  Comparison:  MRI thoracolumbar spine is 08/22/2011  Findings:  C-arm films document bilateral T10 through T12, as well as L2 and L3 bilateral placement of pedicle screws for posterolateral fusion. Prior kyphoplasty at L1.  IMPRESSION: As above.  Original Report Authenticated By: Elsie Stain, M.D.   Dg Lumbar Spine 1 View  09/13/2011  *RADIOLOGY REPORT*  Clinical Data: T10-L3 fusion and kyphoplasty.  LUMBAR SPINE - 1 VIEW  Comparison: MRI of 08/22/2011.  Findings: Single lateral view, labeled 0855 hours.  The lower lumbar spine and lumbosacral junction are suboptimally visualized secondary to underpenetration.  However, levels can be localized based on prior L1 vertebral augmentation.  Surgical devices project posterior to the T10-T11 intervertebral disc and the vertebral bodies of T11-L4.  IMPRESSION:  Intraoperative localization of T11-L4.  Original Report Authenticated By: Consuello Bossier, M.D.   Dg C-arm 51-120 Min  09/13/2011  *RADIOLOGY REPORT*  Clinical Data: Back pain  DG C-ARM 61-120 MIN,THORACOLUMBAR SPINE - 2 VIEW  Comparison:  MRI thoracolumbar spine is 08/22/2011  Findings:  C-arm films document bilateral T10 through T12, as well as L2 and L3 bilateral placement of pedicle screws for posterolateral fusion. Prior kyphoplasty at L1.  IMPRESSION: As above.  Original Report Authenticated By: Elsie Stain, M.D.    Assessment/Plan: No urine output despite fluid bolus.  CCM assisting.  Check CBC, given 450 cc out drain.    LOS: 1 day    Dorian Heckle, MD 09/14/2011, 6:15 AM

## 2011-09-14 NOTE — Progress Notes (Signed)
Patient ID: [name], female   DOB: 1945/08/13, 66 y.o.   MRN: [id number]  Sleeping while sitting up in chair; awakens to voice. Conversant, oriented. No c/o pain with this visit, only complimenting the ICU nursing care.  BUE tremor (?baseline). Urine o/p of 75cc today, improved from overnight. Episodes of hypotension.  Labs reviewed by Dr. Venetia Maxon, will repeat in am. Continue to mobilize as tolerated.   Georgiann Cocker RN, BSN

## 2011-09-14 NOTE — Progress Notes (Signed)
Agree with above.  I appreciate CCM's help.

## 2011-09-15 ENCOUNTER — Inpatient Hospital Stay (HOSPITAL_COMMUNITY): Payer: Medicare Other

## 2011-09-15 DIAGNOSIS — R4182 Altered mental status, unspecified: Secondary | ICD-10-CM

## 2011-09-15 DIAGNOSIS — I519 Heart disease, unspecified: Secondary | ICD-10-CM

## 2011-09-15 LAB — PHOSPHORUS: Phosphorus: 2.9 mg/dL (ref 2.3–4.6)

## 2011-09-15 LAB — BASIC METABOLIC PANEL
Calcium: 8.1 mg/dL — ABNORMAL LOW (ref 8.4–10.5)
GFR calc non Af Amer: 26 mL/min — ABNORMAL LOW (ref >90)
Sodium: 136 mEq/L (ref 135–145)

## 2011-09-15 LAB — MAGNESIUM: Magnesium: 1.5 mg/dL (ref 1.5–2.5)

## 2011-09-15 LAB — CBC
MCH: 29.3 pg (ref 26.0–34.0)
MCHC: 33.5 g/dL (ref 30.0–36.0)
Platelets: 78 10*3/uL — ABNORMAL LOW (ref 150–400)
RDW: 14 % (ref 11.5–15.5)

## 2011-09-15 LAB — PROTIME-INR
INR: 1.35 (ref 0.00–1.49)
Prothrombin Time: 16.9 seconds — ABNORMAL HIGH (ref 11.6–15.2)

## 2011-09-15 LAB — APTT: aPTT: 32 seconds (ref 24–37)

## 2011-09-15 MED ORDER — MAGNESIUM SULFATE 40 MG/ML IJ SOLN
2.0000 g | Freq: Once | INTRAMUSCULAR | Status: AC
  Administered 2011-09-15: 2 g via INTRAVENOUS
  Filled 2011-09-15: qty 50

## 2011-09-15 MED ORDER — SODIUM CHLORIDE 0.9 % IV SOLN
INTRAVENOUS | Status: DC
  Administered 2011-09-15: 12:00:00 via INTRAVENOUS
  Administered 2011-09-16: 75 mL/h via INTRAVENOUS
  Administered 2011-09-17 – 2011-09-18 (×2): via INTRAVENOUS
  Administered 2011-09-19: 50 mL/h via INTRAVENOUS

## 2011-09-15 MED ORDER — NALOXONE HCL 0.4 MG/ML IJ SOLN
INTRAMUSCULAR | Status: AC
  Administered 2011-09-15: 0.4 mg
  Filled 2011-09-15: qty 1

## 2011-09-15 MED FILL — Sodium Chloride IV Soln 0.9%: INTRAVENOUS | Qty: 1000 | Status: AC

## 2011-09-15 MED FILL — Sodium Chloride Irrigation Soln 0.9%: Qty: 3000 | Status: AC

## 2011-09-15 MED FILL — Heparin Sodium (Porcine) Inj 1000 Unit/ML: INTRAMUSCULAR | Qty: 30 | Status: AC

## 2011-09-15 NOTE — Progress Notes (Signed)
As above.

## 2011-09-15 NOTE — Progress Notes (Signed)
Head CT negative

## 2011-09-15 NOTE — Progress Notes (Signed)
Patient ID: [name], female   DOB: [birth year], 66 y.o.   MRN: [id number]  Visit to evaluate changed LOC.  Opens eyes to voice. States that she knows she became confused this morning after Dr. Fredrich Birks visit, saying "I thought I was going to [place], but I know when I leave here I'm going to [employer]."  She currently states her name, location, and my name correctly.  Pain is denied, noting only "discomfort" in back. Drain with minimal blood, emptied yesterday per pt.  MAEW, good strength. Holding pain meds and sedating home meds currently. Will continue to monitor per Dr. Venetia Maxon.   Georgiann Cocker, RN, BSN

## 2011-09-15 NOTE — Progress Notes (Signed)
Subjective: Patient reports feeling better this am.  Objective: Vital signs in last 24 hours: Temp:  [98.4 F (36.9 C)-99.2 F (37.3 C)] 98.6 F (37 C) (06/27 0735) Pulse Rate:  [63-111] 81  (06/27 0800) Resp:  [8-23] 15  (06/27 0800) BP: (79-148)/(29-112) 124/49 mmHg (06/27 0800) SpO2:  [86 %-100 %] 100 % (06/27 0800)  Intake/Output from previous day: 06/26 0701 - 06/27 0700 In: 3840.6 [P.O.:480; I.V.:2750.6; IV Piggyback:500] Out: 1900 [Urine:1800; Drains:100] Intake/Output this shift: Total I/O In: 150 [I.V.:150] Out: 225 [Urine:225]  Physical Exam: Full strength both upper and lower extremities.  Lab Results:  Basename 09/15/11 0420 09/14/11 0925  WBC 6.8 8.8  HGB 7.7* 8.3*  HCT 23.0* 25.8*  PLT 78* 100*   BMET  Basename 09/15/11 0420 09/14/11 0925  NA 136 135  K 4.1 4.5  CL 105 103  CO2 23 23  GLUCOSE 157* 117*  BUN 21 21  CREATININE 1.92* 1.95*  CALCIUM 8.1* 7.5*    Studies/Results: Dg Thoracolumabar Spine  09/13/2011  *RADIOLOGY REPORT*  Clinical Data: Back pain  DG C-ARM 61-120 MIN,THORACOLUMBAR SPINE - 2 VIEW  Comparison:  MRI thoracolumbar spine is 08/22/2011  Findings:  C-arm films document bilateral T10 through T12, as well as L2 and L3 bilateral placement of pedicle screws for posterolateral fusion. Prior kyphoplasty at L1.  IMPRESSION: As above.  Original Report Authenticated By: Elsie Stain, M.D.   US Renal  09/14/2011  *RADIOLOGY REPORT*  Clinical Data: History of creatinine elevation.  History of hypertension.  RENAL/URINARY TRACT ULTRASOUND COMPLETE  Comparison:  CT 06/24/2011.  Findings:  Right Kidney:  Right renal length is 10.8 cm  Left Kidney:  .  Left renal length is 11.0 cm.  Examination of each kidney shows no evidence of hydronephrosis, solid or cystic mass, calculus, parenchymal loss, or parenchymal textural abnormality.  Bladder:  Bladder was decompressed by catheter.  IMPRESSION: No abnormalities were identified by ultrasound.  No  evidence of hydronephrosis.  Original Report Authenticated By: Crawford Givens, M.D.   Dg Chest Port 1 View  09/15/2011  *RADIOLOGY REPORT*  Clinical Data: Central venous line.  Peripheral vascular disease.  PORTABLE CHEST - 1 VIEW  Comparison: 1 day prior  Findings: The patient is moderately rotated to the right.  Left IJ central line terminates at the high SVC.  The second catheters projects over the right-sided chest and is possibly external to the patient.  Mild cardiomegaly with tortuous thoracic aorta. No pleural effusion or pneumothorax.  Patchy left base atelectasis persists.  Apparent increased density over the right apex is favored to be artifactual and secondary to patient positioning.  IMPRESSION:  1.  Moderately degraded by patient positioning. 2.  Cardiomegaly with similar left base atelectasis. 3.  Left IJ central line unchanged.  A second catheter, projecting over the right side of the chest, could be external to the patient. Correlate with interval catheter placement.  Original Report Authenticated By: Consuello Bossier, M.D.   Dg Chest Port 1 View  09/14/2011  *RADIOLOGY REPORT*  Clinical Data: Central line placement  PORTABLE CHEST - 1 VIEW  Comparison: Portable exam 0840 hours compared to 06/24/2011  Findings: New left jugular central venous catheter, tip projecting over SVC. Enlargement of cardiac silhouette. Tortuous aorta with atherosclerotic calcification. Mediastinal contours and pulmonary vascularity otherwise normal. Minimal right base atelectasis. Atelectasis versus infiltrate at retrocardiac left lower lobe. Upper lungs clear. No pleural effusion or pneumothorax. Bones appear demineralized with evidence of prior spinal augmentation and  fusion at the thoracolumbar junction.  IMPRESSION: No pneumothorax following left jugular line placement. Mild atelectasis versus developing infiltrate in retrocardiac left lower lobe.  Original Report Authenticated By: Lollie Marrow, M.D.   Dg C-arm  61-120 Min  09/13/2011  *RADIOLOGY REPORT*  Clinical Data: Back pain  DG C-ARM 61-120 MIN,THORACOLUMBAR SPINE - 2 VIEW  Comparison:  MRI thoracolumbar spine is 08/22/2011  Findings:  C-arm films document bilateral T10 through T12, as well as L2 and L3 bilateral placement of pedicle screws for posterolateral fusion. Prior kyphoplasty at L1.  IMPRESSION: As above.  Original Report Authenticated By: Elsie Stain, M.D.    Assessment/Plan: D/C drain.  Now making urine.  May need transfusion for decreased hemoglobin, but not hemodynamically unstable. Will defer to CCM. Observe in ICU today.    LOS: 2 days    Dorian Heckle, MD 09/15/2011, 9:03 AM

## 2011-09-15 NOTE — Progress Notes (Signed)
Narcan given to patient d/t drowsiness. RN entered room 30 minutes later and pt is hard to arouse taking almost 30 sec to get patient to respond. MD made aware. Will continue to monitor.

## 2011-09-15 NOTE — Progress Notes (Signed)
Patient ID: [name], female   DOB: Oct 11, 1945, 66 y.o.   MRN: [id number]  Intermittent confusion persists, patient aware and anxious. Head CT  Reviewed by Dr. Venetia Maxon: no evidence of acute bleed. Will continue supportive nursing care.   Georgiann Cocker, RN, BSN

## 2011-09-15 NOTE — Progress Notes (Signed)
Pt. Hard to arouse. Once awake she is confused. Dr. Fredrich Birks Nurse Arlys John was made aware. CT of head ordered

## 2011-09-15 NOTE — Progress Notes (Signed)
*  PRELIMINARY RESULTS* Echocardiogram 2D Echocardiogram has been performed.  Jeryl Columbia 09/15/2011, 4:58 PM

## 2011-09-15 NOTE — Progress Notes (Addendum)
Name: [name] MRN: [id number] DOB: Jul 19, 1945    LOS: 2  Referring Provider:  Ricka Burdock Reason for Referral:  Hypotension / anuria  PULMONARY / CRITICAL CARE MEDICINE  HPI:  66 yo with lumbar spinal fracture / spinal cord compression s/p decompression / fixation (intraoperative blood loss 650 mL) with hypotension / anuria after surgery.  Foley catheter has been replaced with minimal urine output.  Bladder scan demonstrated empty bladder.  Patient is thirsty.  Reports some bladder pressure.  Reports postop pain is fairly controlled.  Abx: None  Cultures: None  Access  L IJ TLC 6/26>>>   Foley 6/26>>>  Brief patient description:  66 yo with lumbar spinal fracture / spinal cord compression s/p decompression / fixation (intraoperative blood loss 650 mL) with hypotension / anuria after surgery.  Foley catheter has been replaced with minimal urine output.  Bladder scan demonstrated empty bladder.  Patient is thirsty.  Reports postop pain is fairly controlled.  Events Since Admission: 6/25  Spinal surgery, mild hypotension / decreased urine output postoperartively  Current Status: Awake and alert Vital Signs: Temp:  [98.4 F (36.9 C)-99.2 F (37.3 C)] 98.6 F (37 C) (06/27 0735) Pulse Rate:  [63-111] 81  (06/27 0800) Resp:  [8-23] 15  (06/27 0800) BP: (79-148)/(29-112) 124/49 mmHg (06/27 0800) SpO2:  [96 %-100 %] 97 % (06/27 0915) Intake/Output      06/26 0701 - 06/27 0700 06/27 0701 - 06/28 0700   P.O. 480    I.V. (mL/kg) 2750.6 (32.4) 150 (1.8)   Blood     Other 110    IV Piggyback 500    Total Intake(mL/kg) 3840.6 (45.2) 150 (1.8)   Urine (mL/kg/hr) 1800 (0.9) 225   Drains 100    Blood     Total Output 1900 225   Net +1940.6 -75         Physical Examination: General:  No acute distress, anxcious Neuro:  Awake, alert, cooperative HEENT:  PERRL, dry mucus membranes Neck:  No JVD Cardiovascular:  RRR, no murmurs Lungs:  CTAB , mild exp wheeze Abdomen:  Soft,  nontender, bowel sounds present Musculoskeletal:  No edema, moves all extremities Skin:  Decreased turgor   Intake/Output Summary (Last 24 hours) at 09/15/11 1044 Last data filed at 09/15/11 0720  Gross per 24 hour  Intake 3068.05 ml  Output   2100 ml  Net 968.05 ml   Labs:  Lab 09/15/11 0420 09/14/11 0925 09/14/11 0450 09/12/11 1410  HGB 7.7* 8.3* 8.8* --  HCT 23.0* 25.8* 27.7* --  WBC 6.8 8.8 9.1 --  PLT 78* 100* 106* --  NA 136 135 138 140  K 4.1 4.5 -- --  CL 105 103 102 101  CO2 23 23 26 25   GLUCOSE 157* 117* 127* 125*  BUN 21 21 19 17   CREATININE 1.92* 1.95* 1.94* 0.91  CALCIUM 8.1* 7.5* 7.9* 9.3  MG 1.5 -- -- --  PHOS 2.9 -- -- --  AST -- -- -- --  ALT -- -- -- --  ALKPHOS -- -- -- --  BILITOT -- -- -- --  PROT -- -- -- --  ALBUMIN -- -- -- --  INR 1.35 -- -- --  APTT 32 -- -- --  INR 1.35 -- -- --  LATICACIDVEN -- -- -- --  TROPONINI -- -- -- --   ASSESSMENT AND PLAN  Osteoporosis, pathologic spinal fracture, spinal cord compression, s/p decompression / fixation Hypotension / oliguria Positive fluid balance but clinically dry  Doubt spinal or septic shock  Pulmonary: No active issues.  Titrate O2 down.  F/U CXR.  Cardiac: Remains shocky and lethargic, ? Narcotic related.  Doubt spinal or septic shock Plan:  Echo.  Change IVF to NS.  Continue hydration.  No indication for abx or cultures for now.  Continue pressors.  Monitor CVP.  Renal: UOP was very low and Cr bumped but improved yesterday with IV hydration. Plan: Continue hydration.  Replace Mg.  GI: continue diet.  ID: No active issues.  Endocrine: BS elevated only once, will recheck in AM, no history of diabetes.  Neuro: barely responsive today, I suspect all drug related.  Stopped Clonazepam, trazadone, percocet, hydrocodone but kept valium as PRN.  Will give a single dose of narcan today.  Further recs per neurosurgery.  CC time 35 min.  Alyson Reedy, M.D. [facility]  Pulmonary/Critical Care Medicine. Pager: [phone]. After hours pager: [phone].

## 2011-09-15 NOTE — Progress Notes (Signed)
Physical Therapy Treatment Patient Details Name: [name] MRN: [id number] DOB: 1945/10/25 Today's Date: 09/15/2011 Time: 4098-1191 PT Time Calculation (min): 24 min  PT Assessment / Plan / Recommendation Comments on Treatment Session  Pt very drowsy and difficult to arouse even sitting EOB.  MD present and decided to give pt Narcan due to increase pt's arousal.  Continue to recommend SNF at d/c.  Return pt to supine when BP decreased to 90/65.      Follow Up Recommendations  Skilled nursing facility    Barriers to Discharge        Equipment Recommendations  Defer to next venue    Recommendations for Other Services    Frequency Min 5X/week   Plan Discharge plan remains appropriate;Frequency remains appropriate    Precautions / Restrictions Precautions Precautions: Back Precaution Comments: Educated on 3/3 back precautions Required Braces or Orthoses: Spinal Brace Spinal Brace: Applied in sitting position   Pertinent Vitals/Pain No c/o pain however moans when sitting EOB    Mobility  Bed Mobility Rolling Right: 1: +2 Total assist Rolling Right: Patient Percentage: 0% Right Sidelying to Sit: 1: +2 Total assist Right Sidelying to Sit: Patient Percentage: 10% Sitting - Scoot to Edge of Bed: 2: Max assist Sit to Sidelying Left: 1: +2 Total assist Sit to Sidelying Left: Patient Percentage: 0% Details for Bed Mobility Assistance: +2 (A) with all mobility and pt unable to initiate due to lethargy.  Attempting to sit EOB to increase pt's awareness    Exercises     PT Diagnosis:    PT Problem List:   PT Treatment Interventions:     PT Goals Acute Rehab PT Goals PT Goal Formulation: With patient Time For Goal Achievement: 09/28/11 Potential to Achieve Goals: Good Pt will Roll Supine to Right Side: with min assist PT Goal: Rolling Supine to Right Side - Progress: Not met Pt will go Supine/Side to Sit: with min assist PT Goal: Supine/Side to Sit - Progress: Not met Pt  will go Sit to Supine/Side: with min assist PT Goal: Sit to Supine/Side - Progress: Not met  Visit Information  Last PT Received On: 09/15/11 Assistance Needed: +2    Subjective Data  Subjective: "I'm looking at the cluck."  (pt with slurred speech and very lethargic throughout session.  MD and RN awared) Patient Stated Goal: To eventually go home and become independent again.   Cognition  Overall Cognitive Status: Impaired Area of Impairment: Problem solving Arousal/Alertness: Lethargic Orientation Level: Disoriented to;Time Behavior During Session: Lethargic Problem Solving: Pt very slow to process and difficulty keeping eyes open this session Cognition - Other Comments: Pt with increase lethargy and unable to keep eyes open.      Balance  Balance Balance Assessed: Yes Static Sitting Balance Static Sitting - Balance Support: Feet supported Static Sitting - Level of Assistance: 2: Max assist Static Sitting - Comment/# of Minutes: Pt continued to lean posterior and to the right during sitting.  Pt needed constant tactile and manual cues to maintain midline  End of Session PT - End of Session Equipment Utilized During Treatment: Gait belt;Back brace Activity Tolerance: Treatment limited secondary to medication Patient left: in bed;with call bell/phone within reach;with nursing in room (MD present) Nurse Communication: Mobility status   GP     Gwenyth Dingee 09/15/2011, 12:06 PM Jake Shark, PT DPT [phone]

## 2011-09-15 NOTE — Clinical Social Work Psychosocial (Signed)
     Clinical Social Work Department BRIEF PSYCHOSOCIAL ASSESSMENT 09/15/2011  Patient:  [name], [name]     Account Number:  [id number]     Admit date:  09/13/2011  Clinical Social Worker:  Jacelyn Grip  Date/Time:  09/15/2011 01:30 PM  Referred by:  Physician  Date Referred:  09/15/2011 Referred for  SNF Placement   Other Referral:   Interview type:  Patient Other interview type:   and patient brother via telephone    PSYCHOSOCIAL DATA Living Status:  ALONE Admitted from facility:   Level of care:   Primary support name:  Thayer Ohm Ciaravino/brother/[phone] Primary support relationship to patient:  FAMILY Degree of support available:   adequate    CURRENT CONCERNS Current Concerns  Post-Acute Placement   Other Concerns:    SOCIAL WORK ASSESSMENT / PLAN CSW received referral for New SNF placement. CSW met with pt at bedside to discuss. Pt with intermittent confusion at this time. During discussion about recommendation for SNF, pt stated that she "thinks she needs rehab" then became confused. After brief confusion, pt stated that she was interested in [facility]. CSW provided support to pt as she became anxious as she was aware of her confusion. CSW reassured pt that we could continue this discussion at a later time and pt identified her brother as someone to contact and discuss further. CSW contacted pt brother, Romelle Muldoon. CSW clarified pt brother's questions and pt brother confirmed pt plan was for [facility] at discharge. CSW completed FL2 and initiated SNF search to [facility]. CSW contacted [facility] who stated that pt had been in contact with facility prior to admission and pt had already provided facility with insurance documents and facility is able to offer a bed. CSW to follow up with pt and pt brother and facilitate pt discharge needs when pt medically stable for discharge.   Assessment/plan status:  Psychosocial Support/Ongoing Assessment of  Needs Other assessment/ plan:   discharge planning   Information/referral to community resources:   Referral to [facility]    PATIENTS/FAMILYS RESPONSE TO PLAN OF CARE: Pt alert and oriented, but having persistant intermittent confusion. However, pt was able to recognize need for rehab at SNF at discharge. CSW provided support during pt confusion and will continue to be available to provide support and discuss with pt about discharge planning. Pt brother expressed that he was grateful that pt was planning for SNF as he has been concerned about pt living at home alone.

## 2011-09-15 NOTE — Progress Notes (Addendum)
Clinical Social Work Department CLINICAL SOCIAL WORK PLACEMENT NOTE 09/15/2011  Patient:  [name], [name]  Account Number:  [id number] Admit date:  09/13/2011  Clinical Social Worker:  Jacelyn Grip  Date/time:  09/15/2011 01:30 PM  Clinical Social Work is seeking post-discharge placement for this patient at the following level of care:   SKILLED NURSING   (*CSW will update this form in Epic as items are completed)     Patient/family provided with [facility] Department of Clinical Social Work's list of facilities offering this level of care within the geographic area requested by the patient (or if unable, by the patient's family).  09/15/2011  Patient/family informed of their freedom to choose among providers that offer the needed level of care, that participate in Medicare, Medicaid or managed care program needed by the patient, have an available bed and are willing to accept the patient.  09/15/2011  Patient/family informed of [facility]' ownership interest in [facility], as well as of the fact that they are under no obligation to receive care at this facility.  PASARR submitted to EDS on 09/15/2011 PASARR number received from EDS on 09/19/2011  FL2 transmitted to all facilities in geographic area requested by pt/family on  09/15/2011 FL2 transmitted to all facilities within larger geographic area on   Patient informed that his/her managed care company has contracts with or will negotiate with  certain facilities, including the following:     Patient/family informed of bed offers received:  09/19/2011 Patient chooses bed at [facility] Physician recommends and patient chooses bed at  SNF  Patient to be transferred to [facility] on 09/22/2011 Patient to be transferred to facility by [facility]  The following physician request were entered in Epic:   Additional Comments: Pt had discussions with [facility] prior to admission and planning for [facility] at  discharge.   Jacklynn Lewis, MSW, LCSWA  Clinical Social Work [phone]

## 2011-09-16 LAB — CBC
MCV: 87.2 fL (ref 78.0–100.0)
Platelets: 93 10*3/uL — ABNORMAL LOW (ref 150–400)
RDW: 14.1 % (ref 11.5–15.5)
WBC: 6.3 10*3/uL (ref 4.0–10.5)

## 2011-09-16 LAB — BASIC METABOLIC PANEL
CO2: 23 mEq/L (ref 19–32)
Calcium: 8.4 mg/dL (ref 8.4–10.5)
Creatinine, Ser: 1.81 mg/dL — ABNORMAL HIGH (ref 0.50–1.10)
GFR calc non Af Amer: 28 mL/min — ABNORMAL LOW (ref >90)
Sodium: 139 mEq/L (ref 135–145)

## 2011-09-16 LAB — MAGNESIUM: Magnesium: 2 mg/dL (ref 1.5–2.5)

## 2011-09-16 LAB — PREPARE RBC (CROSSMATCH)

## 2011-09-16 MED ORDER — OXYCODONE-ACETAMINOPHEN 5-325 MG PO TABS
1.0000 | ORAL_TABLET | ORAL | Status: DC | PRN
  Administered 2011-09-16 – 2011-09-22 (×25): 1 via ORAL
  Filled 2011-09-16 (×25): qty 1

## 2011-09-16 MED ORDER — INSULIN ASPART 100 UNIT/ML ~~LOC~~ SOLN: 1.0000 [IU] | SUBCUTANEOUS | Status: DC | PRN

## 2011-09-16 MED ORDER — K PHOS MONO-SOD PHOS DI & MONO 155-852-130 MG PO TABS
500.0000 mg | ORAL_TABLET | Freq: Every day | ORAL | Status: AC
  Administered 2011-09-16: 500 mg via ORAL
  Filled 2011-09-16: qty 2

## 2011-09-16 NOTE — Progress Notes (Signed)
Name: [name] MRN: [id number] DOB: 1961/05/26    LOS: 3  Referring Provider:  Ricka Burdock Reason for Referral:  Hypotension / anuria  PULMONARY / CRITICAL CARE MEDICINE  HPI:  66 yo with lumbar spinal fracture / spinal cord compression s/p decompression / fixation (intraoperative blood loss 650 mL) with hypotension / anuria after surgery.  Foley catheter has been replaced with minimal urine output.  Bladder scan demonstrated empty bladder.  Patient is thirsty.  Reports some bladder pressure.  Reports postop pain is fairly controlled.  Abx: None  Cultures: None  Access  L IJ TLC 6/26>>>   Foley 6/26>>>  Brief patient description:  66 yo with lumbar spinal fracture / spinal cord compression s/p decompression / fixation (intraoperative blood loss 650 mL) with hypotension / anuria after surgery.  Foley catheter has been replaced with minimal urine output.  Bladder scan demonstrated empty bladder.  Patient is thirsty.  Reports postop pain is fairly controlled.  Events Since Admission: 6/25  Spinal surgery, mild hypotension / decreased urine output postoperartively  Current Status: Awake and alert Vital Signs: Temp:  [98.4 F (36.9 C)-100.6 F (38.1 C)] 99.4 F (37.4 C) (06/28 0914) Pulse Rate:  [63-105] 104  (06/28 0700) Resp:  [10-25] 23  (06/28 0700) BP: (78-157)/(40-139) 133/51 mmHg (06/28 0700) SpO2:  [79 %-100 %] 98 % (06/28 0700) Intake/Output      06/27 0701 - 06/28 0700 06/28 0701 - 06/29 0700   P.O. 360    I.V. (mL/kg) 2065.4 (24.3)    Other     IV Piggyback 50    Total Intake(mL/kg) 2475.4 (29.2)    Urine (mL/kg/hr) 2860 (1.4)    Drains     Total Output 2860    Net -384.7          Physical Examination: General:  No acute distress, anxcious Neuro:  Awake, alert, cooperative HEENT:  PERRL, dry mucus membranes Neck:  No JVD Cardiovascular:  RRR, no murmurs Lungs:  CTAB , mild exp wheeze Abdomen:  Soft, nontender, bowel sounds present Musculoskeletal:  No  edema, moves all extremities Skin:  Decreased turgor   Intake/Output Summary (Last 24 hours) at 09/16/11 1025 Last data filed at 09/16/11 0700  Gross per 24 hour  Intake 2325.35 ml  Output   2635 ml  Net -309.65 ml   Labs:  Lab 09/16/11 0500 09/15/11 0420 09/14/11 0925 09/14/11 0450 09/12/11 1410  HGB 7.0* 7.7* 8.3* -- --  HCT 21.1* 23.0* 25.8* -- --  WBC 6.3 6.8 8.8 -- --  PLT 93* 78* 100* -- --  NA 139 136 135 138 140  K 4.3 4.1 -- -- --  CL 107 105 103 102 101  CO2 23 23 23 26 25   GLUCOSE 138* 157* 117* 127* 125*  BUN 15 21 21 19 17   CREATININE 1.81* 1.92* 1.95* 1.94* 0.91  CALCIUM 8.4 8.1* 7.5* 7.9* 9.3  MG 2.0 1.5 -- -- --  PHOS 2.6 2.9 -- -- --  AST -- -- -- -- --  ALT -- -- -- -- --  ALKPHOS -- -- -- -- --  BILITOT -- -- -- -- --  PROT -- -- -- -- --  ALBUMIN -- -- -- -- --  INR -- 1.35 -- -- --  APTT -- 32 -- -- --  INR -- 1.35 -- -- --  LATICACIDVEN -- -- -- -- --  TROPONINI -- -- -- -- --   ASSESSMENT AND PLAN  Osteoporosis, pathologic spinal fracture, spinal  cord compression, s/p decompression / fixation Hypotension / oliguria Positive fluid balance but clinically dry Doubt spinal or septic shock  Pulmonary: No active issues.  Titrate O2 down.  F/U CXR.  Cardiac: Shock resolved, BP is 130's now. Plan:  Echo EF 65-70% with grade one diastolic dysfunction.  Change IVF to NS, will decrease to 50 ml/hr and likely d/c in AM.  No indication for abx or cultures for now.  D/C levophed.  Monitor CVP.  Renal: UOP was very low and Cr bumped but improved yesterday with IV hydration. Plan: NS down to 50 ml/hr, d/c in AM if renal function improves.  Replace Mg.  Neutraphos PO x1.  GI: continue diet.  ID: No active issues.  Endocrine: BS elevated, will start CBG and insulin coverage.  Neuro: Much more arousable compared to 6/27 with discontinuation of narcs and benzos but remains lethargic, will start percocet only PRN for pain.  Alyson Reedy,  M.D. [facility] Pulmonary/Critical Care Medicine. Pager: [phone]. After hours pager: [phone].

## 2011-09-16 NOTE — Progress Notes (Signed)
Subjective: Patient reports still confused, but orients to conversation and follows commands.  Objective: Vital signs in last 24 hours: Temp:  [98.5 F (36.9 C)-100.6 F (38.1 C)] 100.1 F (37.8 C) (06/28 0000) Pulse Rate:  [63-104] 104  (06/28 0200) Resp:  [10-22] 22  (06/28 0200) BP: (78-153)/(35-139) 153/139 mmHg (06/28 0200) SpO2:  [94 %-100 %] 97 % (06/28 0200)  Intake/Output from previous day: 06/27 0701 - 06/28 0700 In: 1575.4 [P.O.:360; I.V.:1165.4; IV Piggyback:50] Out: 2510 [Urine:2510] Intake/Output this shift: Total I/O In: 360 [P.O.:360] Out: 1050 [Urine:1050]  Physical Exam: Stable exam  Lab Results:  Basename 09/15/11 0420 09/14/11 0925  WBC 6.8 8.8  HGB 7.7* 8.3*  HCT 23.0* 25.8*  PLT 78* 100*   BMET  Basename 09/15/11 0420 09/14/11 0925  NA 136 135  K 4.1 4.5  CL 105 103  CO2 23 23  GLUCOSE 157* 117*  BUN 21 21  CREATININE 1.92* 1.95*  CALCIUM 8.1* 7.5*    Studies/Results: Ct Head Wo Contrast  09/15/2011  *RADIOLOGY REPORT*  Clinical Data: Altered mental status.  CT HEAD WITHOUT CONTRAST  Technique:  Contiguous axial images were obtained from the base of the skull through the vertex without contrast.  Comparison: Head CT 03/11/2011.  Findings: Mild cerebral and cerebellar atrophy is again noted. There are patchy and confluent areas of decreased attenuation throughout the deep and periventricular white matter of the cerebral hemispheres bilaterally, compatible with chronic microvascular ischemic changes.  No definite acute intracranial abnormalities.  Specifically, no definite areas of acute/subacute cerebral ischemia, no evidence of acute intracranial hemorrhage, no focal mass, mass effect, hydrocephalus or abnormal intra or extra- axial fluid collections.  No acute displaced skull fractures are noted.  Visualized paranasal sinuses and mastoids are well pneumatized.  IMPRESSION: 1.  No acute intracranial abnormalities. 2.  Mild cerebral and cerebellar  atrophy with chronic microvascular ischemic changes in the cerebral white matter redemonstrated, as above.  Original Report Authenticated By: Florencia Reasons, M.D.   US Renal  09/14/2011  *RADIOLOGY REPORT*  Clinical Data: History of creatinine elevation.  History of hypertension.  RENAL/URINARY TRACT ULTRASOUND COMPLETE  Comparison:  CT 06/24/2011.  Findings:  Right Kidney:  Right renal length is 10.8 cm  Left Kidney:  .  Left renal length is 11.0 cm.  Examination of each kidney shows no evidence of hydronephrosis, solid or cystic mass, calculus, parenchymal loss, or parenchymal textural abnormality.  Bladder:  Bladder was decompressed by catheter.  IMPRESSION: No abnormalities were identified by ultrasound.  No evidence of hydronephrosis.  Original Report Authenticated By: Crawford Givens, M.D.   Dg Chest Port 1 View  09/15/2011  *RADIOLOGY REPORT*  Clinical Data: Central venous line.  Peripheral vascular disease.  PORTABLE CHEST - 1 VIEW  Comparison: 1 day prior  Findings: The patient is moderately rotated to the right.  Left IJ central line terminates at the high SVC.  The second catheters projects over the right-sided chest and is possibly external to the patient.  Mild cardiomegaly with tortuous thoracic aorta. No pleural effusion or pneumothorax.  Patchy left base atelectasis persists.  Apparent increased density over the right apex is favored to be artifactual and secondary to patient positioning.  IMPRESSION:  1.  Moderately degraded by patient positioning. 2.  Cardiomegaly with similar left base atelectasis. 3.  Left IJ central line unchanged.  A second catheter, projecting over the right side of the chest, could be external to the patient. Correlate with interval catheter placement.  Original  Report Authenticated By: Consuello Bossier, M.D.   Dg Chest Port 1 View  09/14/2011  *RADIOLOGY REPORT*  Clinical Data: Central line placement  PORTABLE CHEST - 1 VIEW  Comparison: Portable exam 0840 hours  compared to 06/24/2011  Findings: New left jugular central venous catheter, tip projecting over SVC. Enlargement of cardiac silhouette. Tortuous aorta with atherosclerotic calcification. Mediastinal contours and pulmonary vascularity otherwise normal. Minimal right base atelectasis. Atelectasis versus infiltrate at retrocardiac left lower lobe. Upper lungs clear. No pleural effusion or pneumothorax. Bones appear demineralized with evidence of prior spinal augmentation and fusion at the thoracolumbar junction.  IMPRESSION: No pneumothorax following left jugular line placement. Mild atelectasis versus developing infiltrate in retrocardiac left lower lobe.  Original Report Authenticated By: Lollie Marrow, M.D.    Assessment/Plan: Mobilize with PT.  May need Rehab. Hgb has dropped further with Hct 7.0. Patient will likely do better with transfusion at this point.  Creatinine and urine output continue to improve.  CCM input appreciated.    LOS: 3 days    Dorian Heckle, MD 09/16/2011, 3:04 AM

## 2011-09-16 NOTE — Progress Notes (Signed)
PT Cancellation Note  Treatment cancelled today due to medical issues with patient which prohibited therapy.  Pt Hgb currently 7.0.  Spoke with RN and agreement to hold PT today.  Plan to see tomorrow (09/17/11).  Maile Linford 09/16/2011, 1:18 PM Jake Shark, PT DPT [phone]

## 2011-09-16 NOTE — Progress Notes (Signed)
Levophed off.   

## 2011-09-16 NOTE — Progress Notes (Signed)
Clinical Social Worker continuing to follow for disposition planning. Plan is for [facility] when pt is medically stable for discharge. Pt was referred for Level II pasarr and evaluation is still pending for Level II pasarr. Clinical Social Worker to continue to follow.   Jacklynn Lewis, MSW, LCSWA  Clinical Social Work [phone]

## 2011-09-17 LAB — CBC
HCT: 23.1 % — ABNORMAL LOW (ref 36.0–46.0)
MCH: 28.6 pg (ref 26.0–34.0)
MCV: 85.9 fL (ref 78.0–100.0)
Platelets: 84 10*3/uL — ABNORMAL LOW (ref 150–400)
RBC: 2.69 MIL/uL — ABNORMAL LOW (ref 3.87–5.11)
WBC: 4.6 10*3/uL (ref 4.0–10.5)

## 2011-09-17 LAB — BASIC METABOLIC PANEL
BUN: 14 mg/dL (ref 6–23)
CO2: 24 mEq/L (ref 19–32)
Calcium: 7.8 mg/dL — ABNORMAL LOW (ref 8.4–10.5)
Chloride: 110 mEq/L (ref 96–112)
Creatinine, Ser: 1.59 mg/dL — ABNORMAL HIGH (ref 0.50–1.10)

## 2011-09-17 LAB — MAGNESIUM: Magnesium: 1.7 mg/dL (ref 1.5–2.5)

## 2011-09-17 MED ORDER — HALOPERIDOL LACTATE 5 MG/ML IJ SOLN
5.0000 mg | Freq: Once | INTRAMUSCULAR | Status: AC
  Administered 2011-09-17: 5 mg via INTRAVENOUS

## 2011-09-17 MED ORDER — HALOPERIDOL LACTATE 5 MG/ML IJ SOLN
INTRAMUSCULAR | Status: AC
  Administered 2011-09-18: 5 mg
  Filled 2011-09-17: qty 1

## 2011-09-17 MED ORDER — HALOPERIDOL LACTATE 5 MG/ML IJ SOLN: 5.0000 mg | Freq: Four times a day (QID) | INTRAMUSCULAR | Status: AC | PRN

## 2011-09-17 MED ORDER — BUDESONIDE-FORMOTEROL FUMARATE 160-4.5 MCG/ACT IN AERO
2.0000 | INHALATION_SPRAY | Freq: Two times a day (BID) | RESPIRATORY_TRACT | Status: DC
  Administered 2011-09-17 – 2011-09-22 (×9): 2 via RESPIRATORY_TRACT
  Filled 2011-09-17 (×2): qty 6

## 2011-09-17 NOTE — Progress Notes (Signed)
Name: [name] MRN: [id number] DOB: [birth year]  ELECTRONIC ICU PHYSICIAN NOTE  Problem:  Agitated delirium.  Intervention: Discontinued Valium / Ambien.  Haldol x 1.  Orlean Bradford, M.D. Pulmonary and Critical Care Medicine [facility] Cell: [phone] Pager: [phone]  09/17/2011, 9:41 PM

## 2011-09-17 NOTE — Progress Notes (Signed)
Physical Therapy Treatment Patient Details Name: [name] MRN: [id number] DOB: 04-Nov-1945 Today's Date: 09/17/2011 Time: 4010-2725 PT Time Calculation (min): 26 min  PT Assessment / Plan / Recommendation Comments on Treatment Session  Pt confused throughout session.  Oriented to person, place and reason for admission but talking to her mother who was not present.  Pt getting blood and with decreased Hgb but nursing felt like pt should get OOB to chair.  Pt tolerated limited treatment well and vitals stable throughout.    Follow Up Recommendations  Skilled nursing facility    Barriers to Discharge        Equipment Recommendations  Defer to next venue    Recommendations for Other Services    Frequency Min 5X/week   Plan Discharge plan remains appropriate;Frequency remains appropriate    Precautions / Restrictions Precautions Precautions: Back Precaution Comments: Educated on 3/3 back precautions Required Braces or Orthoses: Spinal Brace Spinal Brace: Applied in sitting position   Pertinent Vitals/Pain Vitals stable throughout treatment.    Mobility  Bed Mobility Bed Mobility: Rolling Left;Left Sidelying to Sit Rolling Left: 1: +2 Total assist;With rail Rolling Left: Patient Percentage: 30% Left Sidelying to Sit: 1: +2 Total assist;HOB flat;With rails Left Sidelying to Sit: Patient Percentage: 40% Sitting - Scoot to Edge of Bed: 2: Max assist Details for Bed Mobility Assistance: Pt needs assist with all aspects of bed mobility.  Cues for technique to follow precautions. Transfers Transfers: Sit to Stand;Stand to Sit;Stand Pivot Transfers Sit to Stand: 1: +2 Total assist;From bed Sit to Stand: Patient Percentage: 40% Stand to Sit: 1: +2 Total assist;To chair/3-in-1 Stand to Sit: Patient Percentage: 40% Stand Pivot Transfers: 1: +2 Total assist Stand Pivot Transfers: Patient Percentage: 40% Details for Transfer Assistance: HHA of 2 for transfer to chair via stand-pivot.   Pt able to WB thru LE's and take pivital steps to chair.  Needed initial assist to weight shift to advance LEs. Ambulation/Gait Ambulation/Gait Assistance: Not tested (comment)        PT Goals Acute Rehab PT Goals Time For Goal Achievement: 09/28/11 Potential to Achieve Goals: Good PT Goal: Supine/Side to Sit - Progress: Progressing toward goal PT Goal: Sit to Stand - Progress: Progressing toward goal PT Goal: Stand to Sit - Progress: Progressing toward goal PT Transfer Goal: Bed to Chair/Chair to Bed - Progress: Progressing toward goal PT Goal: Stand - Progress: Progressing toward goal  Visit Information  Last PT Received On: 09/17/11 Assistance Needed: +2    Subjective Data  Subjective: "Momma, where is your luggage?"  No one present in room other than sitter.  Pt confused throughout.   Cognition  Overall Cognitive Status: Impaired Area of Impairment: Attention;Memory;Awareness of deficits;Problem solving Arousal/Alertness: Awake/alert Orientation Level: Appears intact for tasks assessed Behavior During Session: [facility] for tasks performed Current Attention Level: Sustained Memory: Decreased recall of precautions    Balance  Balance Balance Assessed: Yes Static Sitting Balance Static Sitting - Balance Support: Bilateral upper extremity supported;Feet supported Static Sitting - Level of Assistance: 5: Stand by assistance Static Sitting - Comment/# of Minutes: 5  End of Session PT - End of Session Equipment Utilized During Treatment: Gait belt;Back brace Activity Tolerance: Treatment limited secondary to medical complications (Comment) (Decreased Hgb and getting blood at present) Patient left: in chair;with call bell/phone within reach;with nursing in room (sitter present) Nurse Communication: Mobility status    Newell Coral 09/17/2011, 2:19 PM  Newell Coral, PTA Acute Rehab [phone] (office)

## 2011-09-17 NOTE — Progress Notes (Signed)
Name: [name] MRN: [id number] DOB: April 30, 1945    LOS: 4  Referring Provider:  Venetia Maxon Reason for Referral:  Hypotension, anuria.  PULMONARY / CRITICAL CARE MEDICINE  HPI:  66 yo female admitted on 09/13/2011 with pathologic lumbar spinal fracture with cord compression.  She had posterior lumbar decompression T11-L2 with pedicle screw fixation T10-L3 with posterolateral arthrodesis kyphoplasty 6/25.  She developed hypotension and anuria post-op, and PCCM consulted 6/26.  Events Since Admission: 6/26 Hypotension, anuria 6/27 Confusion  Current Status: Intermittently confused.  Denies chest pain, abdominal pain, dyspnea.    Vital Signs: Temp:  [97.9 F (36.6 C)-99.8 F (37.7 C)] 99 F (37.2 C) (06/29 0400) Pulse Rate:  [70-113] 77  (06/29 0700) Resp:  [12-25] 12  (06/29 0700) BP: (96-128)/(46-84) 118/69 mmHg (06/29 0700) SpO2:  [92 %-100 %] 95 % (06/29 0752)  Physical Examination: General: No distress Neuro: Intermittently confused, follows simple commands, moves all extremities HEENT: no oral exudate Neck: no LAN Cardiovascular: S1S2 regular Lungs: no wheeze/rales Abdomen:  Soft, non tender Musculoskeletal:  No edema Skin: no rashes  Ct Head Wo Contrast  09/15/2011  *RADIOLOGY REPORT*  Clinical Data: Altered mental status.  CT HEAD WITHOUT CONTRAST  Technique:  Contiguous axial images were obtained from the base of the skull through the vertex without contrast.  Comparison: Head CT 03/11/2011.  Findings: Mild cerebral and cerebellar atrophy is again noted. There are patchy and confluent areas of decreased attenuation throughout the deep and periventricular white matter of the cerebral hemispheres bilaterally, compatible with chronic microvascular ischemic changes.  No definite acute intracranial abnormalities.  Specifically, no definite areas of acute/subacute cerebral ischemia, no evidence of acute intracranial hemorrhage, no focal mass, mass effect, hydrocephalus or  abnormal intra or extra- axial fluid collections.  No acute displaced skull fractures are noted.  Visualized paranasal sinuses and mastoids are well pneumatized.  IMPRESSION: 1.  No acute intracranial abnormalities. 2.  Mild cerebral and cerebellar atrophy with chronic microvascular ischemic changes in the cerebral white matter redemonstrated, as above.  Original Report Authenticated By: Florencia Reasons, M.D.      ASSESSMENT AND PLAN  PULMONARY  A: Hx of asthma, rhinitis.  Stable. P:   Transition back to symbicort Change albuterol to prn D/c atrovent, pulmicort Continue flonase, loratadine  CARDIOVASCULAR  Lines:  Lt IJ CVL 6/26>>  Echo 6/27>>EF 65 to 70%, grade 1 diastolic dysfx, mild LA dilation.  A: Hypotension, likely from hypovolemia>>resolved.  Hx of HTN, hyperlipidemia. P:  Continue IV fluids Hold outpt diuretics/anti-HTN meds for now Continue atorvastatin Keep CVL in for now  RENAL  Lab 09/17/11 0358 09/16/11 0500 09/15/11 0420 09/14/11 0925 09/14/11 0450  NA 143 139 136 135 138  K 4.0 4.3 -- -- --  CL 110 107 105 103 102  CO2 24 23 23 23 26   BUN 14 15 21 21 19   CREATININE 1.59* 1.81* 1.92* 1.95* 1.94*  CALCIUM 7.8* 8.4 8.1* 7.5* 7.9*  MG 1.7 2.0 1.5 -- --  PHOS 3.6 2.6 2.9 -- --   Intake/Output      06/28 0701 - 06/29 0700 06/29 0701 - 06/30 0700   P.O. 820    I.V. (mL/kg) 1528 (18)    Blood 700    IV Piggyback     Total Intake(mL/kg) 3048 (35.9)    Urine (mL/kg/hr) 1870 (0.9)    Total Output 1870    Net +1178          Foley:  6/26>> Renal u/s  6/26>>no abnormalities, no hydronephrosis  A: Acute renal failure, oliguria/anuria.  Improved with volume resuscitation.  Baseline creatinine 0.91 from 09/12/11. P:   Continue IV fluids F/u renal fx, and urine outpt Keep foley in for now  GASTROINTESTINAL  A: Nutrition. P:   Regular diet  HEMATOLOGIC  Lab 09/17/11 0358 09/16/11 0500 09/15/11 0420 09/14/11 0925 09/14/11 0450  HGB 7.7* 7.0* 7.7*  8.3* 8.8*  HCT 23.1* 21.1* 23.0* 25.8* 27.7*  PLT 84* 93* 78* 100* 106*  INR -- -- 1.35 -- --  APTT -- -- 32 -- --   A:  Anemia, thrombocytopenia.  No evidence for bleeding. P:  F/u CBC Transfuse for Hb < 7 PRBC transfusion ordered by neurosurgery 6/29  INFECTIOUS  A:  No evidence for infection.   ENDOCRINE  A: Hyperglycemia.  No hx of DM.  Improved. P:   Monitor glucose on BMET  NEUROLOGIC  A: Lumbar spine fracture s/p surgery 6/25. P:   Post-op care, pain control per neurosurgery.  A: Confusion.  Likely from narcotics.  Improved.  CT head 6/27>>mild atrophy and chronic microvascular ischemia.  Hx of depression. P: Limit benzo's, narcotics as tolerated Continue fluoxetine, lamotrigine, lyrica  BEST PRACTICE / DISPOSITION  DVT Px: SCD GI Px:  Protonix  PCCM will follow up 7/01>>call if help needed sooner Could transfer out of ICU if okay with neurosurgery  Coralyn Helling, MD [facility] Pulmonary/Critical Care 09/17/2011, 10:02 AM Pager:  [phone] After 3pm call: [phone]

## 2011-09-17 NOTE — Progress Notes (Signed)
Patient ID: [name], female   DOB: 1945/06/15, 66 y.o.   MRN: [id number] Subjective: Patient reports Unable to get comfortable  Objective: Vital signs in last 24 hours: Temp:  [97.9 F (36.6 C)-99.8 F (37.7 C)] 99 F (37.2 C) (06/29 0400) Pulse Rate:  [70-113] 77  (06/29 0700) Resp:  [12-25] 12  (06/29 0700) BP: (96-128)/(46-84) 118/69 mmHg (06/29 0700) SpO2:  [92 %-100 %] 95 % (06/29 0752)  Intake/Output from previous day: 06/28 0701 - 06/29 0700 In: 3048 [P.O.:820; I.V.:1528; Blood:700] Out: 1870 [Urine:1870] Intake/Output this shift:    Strength and sensation stable  Lab Results:  Basename 09/17/11 0358 09/16/11 0500  WBC 4.6 6.3  HGB 7.7* 7.0*  HCT 23.1* 21.1*  PLT 84* 93*   BMET  Basename 09/17/11 0358 09/16/11 0500  NA 143 139  K 4.0 4.3  CL 110 107  CO2 24 23  GLUCOSE 95 138*  BUN 14 15  CREATININE 1.59* 1.81*  CALCIUM 7.8* 8.4    Studies/Results: Ct Head Wo Contrast  09/15/2011  *RADIOLOGY REPORT*  Clinical Data: Altered mental status.  CT HEAD WITHOUT CONTRAST  Technique:  Contiguous axial images were obtained from the base of the skull through the vertex without contrast.  Comparison: Head CT 03/11/2011.  Findings: Mild cerebral and cerebellar atrophy is again noted. There are patchy and confluent areas of decreased attenuation throughout the deep and periventricular white matter of the cerebral hemispheres bilaterally, compatible with chronic microvascular ischemic changes.  No definite acute intracranial abnormalities.  Specifically, no definite areas of acute/subacute cerebral ischemia, no evidence of acute intracranial hemorrhage, no focal mass, mass effect, hydrocephalus or abnormal intra or extra- axial fluid collections.  No acute displaced skull fractures are noted.  Visualized paranasal sinuses and mastoids are well pneumatized.  IMPRESSION: 1.  No acute intracranial abnormalities. 2.  Mild cerebral and cerebellar atrophy with chronic  microvascular ischemic changes in the cerebral white matter redemonstrated, as above.  Original Report Authenticated By: Florencia Reasons, M.D.    Assessment/Plan: Stable but somewhat confused. Says she can't eat because she can't get comfortable, but she really looks to be resting well. Will continue present rx. Will give 2 more units of blood due to low H/H  LOS: 4 days  As above   Reinaldo Meeker, MD 09/17/2011, 9:01 AM

## 2011-09-18 LAB — TYPE AND SCREEN
Unit division: 0
Unit division: 0

## 2011-09-18 LAB — CBC
HCT: 31.3 % — ABNORMAL LOW (ref 36.0–46.0)
Hemoglobin: 10.6 g/dL — ABNORMAL LOW (ref 12.0–15.0)
MCH: 29.4 pg (ref 26.0–34.0)
MCHC: 33.9 g/dL (ref 30.0–36.0)
MCV: 86.9 fL (ref 78.0–100.0)
RBC: 3.6 MIL/uL — ABNORMAL LOW (ref 3.87–5.11)

## 2011-09-18 LAB — BASIC METABOLIC PANEL
BUN: 18 mg/dL (ref 6–23)
CO2: 24 mEq/L (ref 19–32)
Chloride: 109 mEq/L (ref 96–112)
Glucose, Bld: 88 mg/dL (ref 70–99)
Potassium: 3.9 mEq/L (ref 3.5–5.1)
Sodium: 142 mEq/L (ref 135–145)

## 2011-09-18 NOTE — Progress Notes (Signed)
Glucometer not accepting pt's armband ID.  CBG 152 at 2200

## 2011-09-18 NOTE — Progress Notes (Signed)
Patient ID: [name], female   DOB: Sep 20, 1945, 66 y.o.   MRN: [id number] Subjective: Patient reports Feeling better  Objective: Vital signs in last 24 hours: Temp:  [97.8 F (36.6 C)-101.4 F (38.6 C)] 97.8 F (36.6 C) (06/29 2000) Pulse Rate:  [67-103] 74  (06/30 0800) Resp:  [13-27] 13  (06/30 0800) BP: (90-143)/(52-90) 133/63 mmHg (06/30 0800) SpO2:  [89 %-99 %] 96 % (06/30 0800) Weight:  [92.9 kg (204 lb 12.9 oz)] 92.9 kg (204 lb 12.9 oz) (06/30 0800)  Intake/Output from previous day: 06/29 0701 - 06/30 0700 In: 2680 [P.O.:780; I.V.:1200; Blood:700] Out: 825 [Urine:825] Intake/Output this shift: Total I/O In: 100 [I.V.:100] Out: 64 [Urine:64]  No new neuro changes. Is more appropriately conversant  Lab Results:  Basename 09/18/11 0605 09/17/11 0358  WBC 5.4 4.6  HGB 10.6* 7.7*  HCT 31.3* 23.1*  PLT 106* 84*   BMET  Basename 09/18/11 0605 09/17/11 0358  NA 142 143  K 3.9 4.0  CL 109 110  CO2 24 24  GLUCOSE 88 95  BUN 18 14  CREATININE 1.61* 1.59*  CALCIUM 8.5 7.8*    Studies/Results: No results found.  Assessment/Plan: Doing better. Eating more today. Will continue present rx.  LOS: 5 days  As above   Reinaldo Meeker, MD 09/18/2011, 9:04 AM

## 2011-09-18 NOTE — Progress Notes (Signed)
Physical Therapy Treatment Patient Details Name: [name] MRN: [id number] DOB: Apr 05, 1945 Today's Date: 09/18/2011 Time: 8657-8469 PT Time Calculation (min): 16 min  PT Assessment / Plan / Recommendation Comments on Treatment Session  Pt with improvements in cognition this session, although still with decreased cognition and safety. Pt needed max cueing for encouragement and proper sequencing.    Follow Up Recommendations  Skilled nursing facility    Barriers to Discharge        Equipment Recommendations  Defer to next venue    Recommendations for Other Services    Frequency Min 5X/week   Plan Discharge plan remains appropriate;Frequency remains appropriate    Precautions / Restrictions Precautions Precautions: Back Precaution Comments: Educated on 3/3 back precautions Required Braces or Orthoses: Spinal Brace Spinal Brace: Applied in sitting position Restrictions Weight Bearing Restrictions: No       Mobility  Bed Mobility Bed Mobility: Rolling Left;Left Sidelying to Sit Rolling Left: 1: +2 Total assist Rolling Left: Patient Percentage: 40% Left Sidelying to Sit: 1: +2 Total assist Left Sidelying to Sit: Patient Percentage: 30% Details for Bed Mobility Assistance: Assist through drawsheet and trunk into sitting. Max cueing throughout for proper sequencing to maintain back precautions during transfer Transfers Transfers: Sit to Stand;Stand to Sit;Stand Pivot Transfers Sit to Stand: 1: +2 Total assist;From bed Sit to Stand: Patient Percentage: 50% Stand to Sit: 1: +2 Total assist;To chair/3-in-1 Stand to Sit: Patient Percentage: 40% Stand Pivot Transfers: 1: +2 Total assist Stand Pivot Transfers: Patient Percentage: 40% Details for Transfer Assistance: VC for proper sequencing throughout transfer as well as sequencing from bed to chair. Assist for stability throughout. Cueing needed for motivation Ambulation/Gait Ambulation/Gait Assistance: Not tested (comment)    Exercises General Exercises - Lower Extremity Ankle Circles/Pumps: AROM;Strengthening;Both;10 reps;Supine    PT Goals Acute Rehab PT Goals PT Goal: Rolling Supine to Right Side - Progress: Progressing toward goal PT Goal: Supine/Side to Sit - Progress: Progressing toward goal PT Goal: Sit to Stand - Progress: Progressing toward goal PT Goal: Stand to Sit - Progress: Progressing toward goal PT Transfer Goal: Bed to Chair/Chair to Bed - Progress: Progressing toward goal PT Goal: Stand - Progress: Progressing toward goal  Visit Information  Last PT Received On: 09/18/11 Assistance Needed: +2    Subjective Data      Cognition  Cognition - Other Comments: improvements in cognition, although still with decreased safety awareness and behavior    Balance     End of Session PT - End of Session Equipment Utilized During Treatment: Gait belt;Back brace Activity Tolerance: Patient limited by pain Patient left: in chair;with call bell/phone within reach;Other (comment) Psychiatrist) Nurse Communication: Mobility status    [name] 09/18/2011, 11:11 AM

## 2011-09-19 DIAGNOSIS — N179 Acute kidney failure, unspecified: Secondary | ICD-10-CM

## 2011-09-19 LAB — GLUCOSE, CAPILLARY
Glucose-Capillary: 97 mg/dL (ref 70–99)
Glucose-Capillary: 95 mg/dL (ref 70–99)
Glucose-Capillary: 117 mg/dL — ABNORMAL HIGH (ref 70–99)
Glucose-Capillary: 99 mg/dL (ref 70–99)
Glucose-Capillary: 140 mg/dL — ABNORMAL HIGH (ref 70–99)
Glucose-Capillary: 90 mg/dL (ref 70–99)

## 2011-09-19 NOTE — Progress Notes (Signed)
Physical Therapy Treatment Patient Details Name: [name] MRN: [id number] DOB: February 15, 1946 Today's Date: 09/19/2011 Time: 7829-5621 PT Time Calculation (min): 28 min  PT Assessment / Plan / Recommendation Comments on Treatment Session  Pt able to ambulate this session taking small steps however limited due to overall fatigue.      Follow Up Recommendations  Skilled nursing facility    Barriers to Discharge        Equipment Recommendations  Defer to next venue    Recommendations for Other Services    Frequency Min 5X/week   Plan Discharge plan remains appropriate;Frequency remains appropriate    Precautions / Restrictions Precautions Precautions: Back Precaution Comments: Educated on 3/3 back precautions Required Braces or Orthoses: Spinal Brace Spinal Brace: Applied in sitting position   Pertinent Vitals/Pain Noticeable wheezing however SaO2 maintain 97%.  Pt remain on 2L throughout session.    Mobility  Bed Mobility Bed Mobility: Rolling Left;Left Sidelying to Sit Rolling Right: With rail;Other (comment) Rolling Left: 2: Max assist Left Sidelying to Sit: 2: Max assist;HOB flat;With rails Sitting - Scoot to Edge of Bed: 4: Min assist Details for Bed Mobility Assistance: (A) to initiate roll and complete task.  (A) to elevate trunk OOB with max cues for proper technique Transfers Transfers: Sit to Stand;Stand to Sit Sit to Stand: 1: +2 Total assist;From bed Sit to Stand: Patient Percentage: 70% Stand to Sit: 1: +2 Total assist;To chair/3-in-1 Stand to Sit: Patient Percentage: 70% Details for Transfer Assistance: (A) to initiate transfer with max cues for hand placement.   Ambulation/Gait Ambulation/Gait Assistance: 1: +2 Total assist Ambulation/Gait: Patient Percentage: 70% Ambulation Distance (Feet): 6 Feet Assistive device: 2 person hand held assist Ambulation/Gait Assistance Details: +2 (A) to maintain balance with cues for proper step sequence and increase step  length. Gait Pattern: Decreased stride length;Shuffle;Narrow base of support;Decreased trunk rotation Gait velocity: decrease gait speed    Exercises     PT Diagnosis:    PT Problem List:   PT Treatment Interventions:     PT Goals Acute Rehab PT Goals PT Goal Formulation: With patient Time For Goal Achievement: 09/28/11 Potential to Achieve Goals: Good Pt will go Supine/Side to Sit: with min assist PT Goal: Supine/Side to Sit - Progress: Progressing toward goal Pt will go Sit to Supine/Side: with min assist PT Goal: Sit to Supine/Side - Progress: Progressing toward goal Pt will go Sit to Stand: with min assist PT Goal: Sit to Stand - Progress: Progressing toward goal Pt will go Stand to Sit: with min assist PT Goal: Stand to Sit - Progress: Progressing toward goal Pt will Transfer Bed to Chair/Chair to Bed: with min assist PT Transfer Goal: Bed to Chair/Chair to Bed - Progress: Progressing toward goal Pt will Stand: with supervision;1 - 2 min PT Goal: Stand - Progress: Progressing toward goal Pt will Ambulate: 16 - 50 feet;with min assist;with rolling walker PT Goal: Ambulate - Progress: Progressing toward goal  Visit Information  Last PT Received On: 09/19/11 Assistance Needed: +2    Subjective Data      Cognition  Overall Cognitive Status: Impaired Arousal/Alertness: Awake/alert Orientation Level: Appears intact for tasks assessed Behavior During Session: [facility] for tasks performed Problem Solving: Pt slow to process at times and needs extra time to complete task. Question due to pain.    Balance  Balance Balance Assessed: Yes Static Sitting Balance Static Sitting - Balance Support: Feet supported Static Sitting - Level of Assistance: 5: Stand by assistance  End  of Session PT - End of Session Equipment Utilized During Treatment: Gait belt;Back brace Activity Tolerance: Patient limited by pain Patient left: in chair;with call bell/phone within reach;Other  (comment) Nurse Communication: Mobility status   GP     [name] 09/19/2011, 3:58 PM

## 2011-09-19 NOTE — Progress Notes (Signed)
Clinical Social Worker continuing to follow for disposition planning. Clinical Social Worker discussed with RN who stated that pt cognition improved. Clinical Social Worker met with pt at bedside to discuss and confirm plan for discharge. Pt confirmed that she plans to discharge to [facility] when medically stable. Clinical Social Worker updated [employer]. Clinical Social Worker received phone call from [employer] Glass blower/designer who stated they planned to evaluate pt at 5:30pm this evening. Clinical Personnel officer and pt. Clinical Social Worker to continue to follow and facilitate pt discharge needs when pt medically stable for discharge.  Jacklynn Lewis, MSW, LCSWA  Clinical Social Work [phone]

## 2011-09-19 NOTE — Progress Notes (Signed)
Occupational Therapy Treatment Patient Details Name: [name] MRN: [id number] DOB: [birth year] Today's Date: 09/19/2011 Time: 4098-1191 OT Time Calculation (min): 28 min  OT Assessment / Plan / Recommendation Comments on Treatment Session Pt. is moving slowly, but did improve independence with functional transfers.  Pt. with decreased recall of precautions.  Recommend SNF    Follow Up Recommendations  Skilled nursing facility    Barriers to Discharge       Equipment Recommendations  Defer to next venue    Recommendations for Other Services    Frequency Min 2X/week   Plan Discharge plan remains appropriate    Precautions / Restrictions Precautions Precautions: Back Precaution Comments: Educated on 3/3 back precautions Required Braces or Orthoses: Spinal Brace Spinal Brace: Applied in sitting position Restrictions Weight Bearing Restrictions: No   Pertinent Vitals/Pain     ADL  Toilet Transfer: Simulated;+2 Total assistance Toilet Transfer: Patient Percentage: 70% Toilet Transfer Method: Stand pivot Acupuncturist: Bedside commode Toileting - Clothing Manipulation and Hygiene: Simulated;+1 Total assistance Where Assessed - Glass blower/designer Manipulation and Hygiene: Standing Transfers/Ambulation Related to ADLs: Pt. ambulated ~5 ft. with total A +2 (pt. ~70%) ADL Comments: Pt. unable to recall back precautions.  Requires mod cues for bed mobility.  Max A to don brace    OT Diagnosis:    OT Problem List:   OT Treatment Interventions:     OT Goals Acute Rehab OT Goals Potential to Achieve Goals: Good ADL Goals ADL Goal: Toilet Transfer - Progress: Progressing toward goals ADL Goal: Toileting - Clothing Manipulation - Progress: Progressing toward goals ADL Goal: Additional Goal #1 - Progress: Progressing toward goals  Visit Information  Last OT Received On: 09/19/11 Assistance Needed: +2    Subjective Data      Prior Functioning         Cognition  Overall Cognitive Status: Impaired Area of Impairment: Safety/judgement;Awareness of errors;Problem solving Arousal/Alertness: Awake/alert Orientation Level: Appears intact for tasks assessed Behavior During Session: [facility] for tasks performed Current Attention Level: Selective Memory: Decreased recall of precautions Safety/Judgement: Decreased awareness of safety precautions;Decreased safety judgement for tasks assessed Awareness of Errors: Assistance required to identify errors made;Assistance required to correct errors made Problem Solving: Pt slow to process at times and needs extra time to complete task. Question due to pain.    Mobility Bed Mobility Bed Mobility: Rolling Left;Left Sidelying to Sit Rolling Right: With rail;Other (comment) Rolling Left: 2: Max assist Rolling Left: Patient Percentage: 40% Left Sidelying to Sit: 2: Max assist;HOB flat;With rails Left Sidelying to Sit: Patient Percentage: 30% Sitting - Scoot to Edge of Bed: 4: Min assist Details for Bed Mobility Assistance: (A) to initiate roll and complete task.  (A) to elevate trunk OOB with max cues for proper technique Transfers Transfers: Sit to Stand;Stand to Sit Sit to Stand: 1: +2 Total assist;From bed Sit to Stand: Patient Percentage: 70% Stand to Sit: 1: +2 Total assist;To chair/3-in-1 Stand to Sit: Patient Percentage: 70% Details for Transfer Assistance: (A) to initiate transfer with max cues for hand placement.     Exercises    Balance Balance Balance Assessed: Yes Static Sitting Balance Static Sitting - Balance Support: Feet supported Static Sitting - Level of Assistance: 5: Stand by assistance  End of Session OT - End of Session Equipment Utilized During Treatment: Back brace;Gait belt Activity Tolerance: Patient limited by pain Patient left: in chair;with call bell/phone within reach;with nursing in room Nurse Communication: Mobility status  GO  Jeani Hawking M 09/19/2011, 4:16  PM

## 2011-09-19 NOTE — Progress Notes (Signed)
Name: [name] MRN: [id number] DOB: 02/26/1946    LOS: 6 Date of admit 09/13/2011   Referring Provider:  Venetia Maxon Reason for Referral:  Hypotension, anuria.  PULMONARY / CRITICAL CARE MEDICINE  HPI:  66 yo female admitted on 09/13/2011 with pathologic lumbar spinal fracture with cord compression.  She had posterior lumbar decompression T11-L2 with pedicle screw fixation T10-L3 with posterolateral arthrodesis kyphoplasty 6/25.  She developed hypotension and anuria post-op, and PCCM consulted 6/26.  Events Since Admission: 6/26 Hypotension, anuria 6/27 Confusion 6/29: Intermittently confused.   Current Status: Denies chest pain, abdominal pain, dyspnea.   No longer confused Eating  Out of bed to chair  Vital Signs: Temp:  [97.6 F (36.4 C)-98.9 F (37.2 C)] 98.4 F (36.9 C) (07/01 0800) Pulse Rate:  [60-88] 88  (07/01 0900) Resp:  [11-22] 17  (07/01 0900) BP: (78-143)/(41-106) 128/94 mmHg (07/01 0900) SpO2:  [92 %-99 %] 95 % (07/01 0900)  Physical Examination: General: No distress Neuro: RASS 0. CAM-ICU neg for delirium. Moves all 4s HEENT: no oral exudate Neck: no LAN Cardiovascular: S1S2 regular Lungs: no wheeze/rales Abdomen:  Soft, non tender Musculoskeletal:  No edema Skin: no rashes  No results found.   Lab 09/18/11 0605 09/17/11 0358 09/16/11 0500  HGB 10.6* 7.7* 7.0*  HCT 31.3* 23.1* 21.1*  WBC 5.4 4.6 6.3  PLT 106* 84* 93*    Lab 09/18/11 0605 09/17/11 0358 09/16/11 0500 09/15/11 0420 09/14/11 0925  NA 142 143 139 136 135  K 3.9 4.0 -- -- --  CL 109 110 107 105 103  CO2 24 24 23 23 23   GLUCOSE 88 95 138* 157* 117*  BUN 18 14 15 21 21   CREATININE 1.61* 1.59* 1.81* 1.92* 1.95*  CALCIUM 8.5 7.8* 8.4 8.1* 7.5*  MG -- 1.7 2.0 1.5 --  PHOS -- 3.6 2.6 2.9 --   No results found for this basename: PHART:5,PCO2:5,PCO2ART:5,PO2:5,PO2ART:5,HCO3:5,TCO2:5,O2SAT:5 in the last 168 hours   Lab 09/14/11 1015  PROCALCITON 0.14   No results found for this  basename: PROBNP:5 in the last 168 hours   ASSESSMENT AND PLAN  PULMONARY  A: Hx of asthma, rhinitis.  Stable on 09/19/2011  P:   symbicort Change albuterol to prn D/c atrovent, pulmicort Continue flonase, loratadine  CARDIOVASCULAR  Lines:  Lt IJ CVL 6/26>>  Echo 6/27>>EF 65 to 70%, grade 1 diastolic dysfx, mild LA dilation.  A: Hypotension, likely from hypovolemia>>resolved.  Hx of HTN, hyperlipidemia. P:  Continue IV fluids at 50cc/h Hold outpt diuretics/anti-HTN meds for now Continue atorvastatin Keep CVL in for now  RENAL  Lab 09/18/11 0605 09/17/11 0358 09/16/11 0500 09/15/11 0420 09/14/11 0925  NA 142 143 139 136 135  K 3.9 4.0 -- -- --  CL 109 110 107 105 103  CO2 24 24 23 23 23   BUN 18 14 15 21 21   CREATININE 1.61* 1.59* 1.81* 1.92* 1.95*  CALCIUM 8.5 7.8* 8.4 8.1* 7.5*  MG -- 1.7 2.0 1.5 --  PHOS -- 3.6 2.6 2.9 --   Intake/Output      06/30 0701 - 07/01 0700 07/01 0701 - 07/02 0700   P.O. 420 50   I.V. (mL/kg) 1209 (13) 100 (1.1)   Blood     Total Intake(mL/kg) 1629 (17.5) 150 (1.6)   Urine (mL/kg/hr) 672 (0.3) 70   Total Output 672 70   Net +957 +80         Foley:  6/26>> Renal u/s 6/26>>no abnormalities, no hydronephrosis  A:  Acute renal failure, oliguria/anuria.  Improved with volume resuscitation.  Baseline creatinine 0.91 from 09/12/11.  - on 09/19/2011: creat 1.6 and unchanged in 24h but slowly improving past several days  P:   Continue IV fluids at 50cc/h Check bnp  dc foley F/u renal fx, and urine outpt   GASTROINTESTINAL  A: Nutrition. P:   Regular diet  HEMATOLOGIC  Lab 09/18/11 0605 09/17/11 0358 09/16/11 0500 09/15/11 0420 09/14/11 0925  HGB 10.6* 7.7* 7.0* 7.7* 8.3*  HCT 31.3* 23.1* 21.1* 23.0* 25.8*  PLT 106* 84* 93* 78* 100*  INR -- -- -- 1.35 --  APTT -- -- -- 32 --   A:  Anemia, thrombocytopenia.  No evidence for bleeding.PRBC transfusion ordered by neurosurgery 6/29 P:  F/u CBC Transfuse for Hb <  7   INFECTIOUS  A:  No evidence for infection.   ENDOCRINE  A: Hyperglycemia.  No hx of DM.  Improved. P:   Monitor glucose on BMET  NEUROLOGIC  A: Lumbar spine fracture s/p surgery 6/25. P:   Post-op care, pain control per neurosurgery.  A: Confusion.  Likely from narcotics.  Improved.  CT head 6/27>>mild atrophy and chronic microvascular ischemia.  Hx of depression.  - normal mental status 09/19/2011  P: Limit benzo's, narcotics as tolerated Continue fluoxetine, lamotrigine, lyrica  BEST PRACTICE / DISPOSITION  DVT Px: SCD GI Px:  Protonix   Dr. Kalman Shan, M.D., [facility].C.P Pulmonary and Critical Care Medicine Staff Physician [facility] [place] [facility] Pager: [phone], If no answer or between  15:00h - 7:00h: call [phone]  09/19/2011 10:03 AM

## 2011-09-19 NOTE — Progress Notes (Signed)
Still needs help and close observation, but clearly improving.  Full strength both legs to confrontational testing

## 2011-09-19 NOTE — Progress Notes (Signed)
Subjective: Patient reports "Hello, [name]!  I'm doing better. I'm not confused!"  Objective: Vital signs in last 24 hours: Temp:  [97.6 F (36.4 C)-98.9 F (37.2 C)] 98.1 F (36.7 C) (07/01 0400) Pulse Rate:  [60-92] 84  (07/01 0700) Resp:  [11-23] 19  (07/01 0700) BP: (78-143)/(41-106) 143/106 mmHg (07/01 0700) SpO2:  [92 %-99 %] 96 % (07/01 0720)  Intake/Output from previous day: 06/30 0701 - 07/01 0700 In: 1629 [P.O.:420; I.V.:1209] Out: 672 [Urine:672] Intake/Output this shift: Total I/O In: 50 [I.V.:50] Out: 70 [Urine:70]  Alert, conversant. Skin color improved since Friday; more alert; confusion has decreased markedly over weekend per nursing. Up to chair with PT last two days.  Lab Results:  [facility] 09/18/11 0605 09/17/11 0358  WBC 5.4 4.6  HGB 10.6* 7.7*  HCT 31.3* 23.1*  PLT 106* 84*   BMET  Basename 09/18/11 0605 09/17/11 0358  NA 142 143  K 3.9 4.0  CL 109 110  CO2 24 24  GLUCOSE 88 95  BUN 18 14  CREATININE 1.61* 1.59*  CALCIUM 8.5 7.8*    Studies/Results: No results found.  Assessment/Plan: Improving  LOS: 6 days  Continue to mobilize with PT/OT   Demond Shallenberger, [name] 09/19/2011, 8:56 AM

## 2011-09-20 LAB — GLUCOSE, CAPILLARY
Glucose-Capillary: 114 mg/dL — ABNORMAL HIGH (ref 70–99)
Glucose-Capillary: 90 mg/dL (ref 70–99)
Glucose-Capillary: 133 mg/dL — ABNORMAL HIGH (ref 70–99)

## 2011-09-20 MED ORDER — NON FORMULARY: 10.0000 mg | Freq: Every morning | Status: DC

## 2011-09-20 MED ORDER — CETIRIZINE HCL 10 MG PO TABS
10.0000 mg | ORAL_TABLET | Freq: Every day | ORAL | Status: DC
  Administered 2011-09-20 – 2011-09-22 (×3): 10 mg via ORAL
  Filled 2011-09-20 (×3): qty 1

## 2011-09-20 NOTE — Progress Notes (Signed)
Subjective: Patient reports "I'm doing good. Therapy wore me out."  Objective: Vital signs in last 24 hours: Temp:  [98.4 F (36.9 C)-99.4 F (37.4 C)] 98.8 F (37.1 C) (07/02 0326) Pulse Rate:  [65-91] 65  (07/02 0700) Resp:  [7-22] 16  (07/02 0700) BP: (112-155)/(53-125) 137/66 mmHg (07/02 0700) SpO2:  [91 %-99 %] 92 % (07/02 0700)  Intake/Output from previous day: 07/01 0701 - 07/02 0700 In: 1300 [P.O.:100; I.V.:1200] Out: 655 [Urine:655] Intake/Output this shift:    Alert, conversant. MAEW. No confusion through the night per nursing; oriented & appropriate this am.   Lab Results:  [facility] 09/18/11 0605  WBC 5.4  HGB 10.6*  HCT 31.3*  PLT 106*   BMET  Basename 09/18/11 0605  NA 142  K 3.9  CL 109  CO2 24  GLUCOSE 88  BUN 18  CREATININE 1.61*  CALCIUM 8.5    Studies/Results: No results found.  Assessment/Plan: Improving  LOS: 7 days  Per Dr. Venetia Maxon, will monitor this am in ICU; likely transfer to the floor if still doing well this afternoon.   [name] 09/20/2011, 7:33 AM

## 2011-09-20 NOTE — Plan of Care (Signed)
Problem: Phase II Progression Outcomes Goal: Discharge plan established Outcome: Progressing [place]

## 2011-09-20 NOTE — Progress Notes (Signed)
Agree with above 

## 2011-09-20 NOTE — Progress Notes (Signed)
Physical Therapy Treatment Patient Details Name: [name] MRN: [id number] DOB: 1945-05-26 Today's Date: 09/20/2011 Time: 1191-4782 PT Time Calculation (min): 24 min  PT Assessment / Plan / Recommendation Comments on Treatment Session  Pt able to increase ambulation distance however continues to be limited due to overall fatigue.    Follow Up Recommendations  Skilled nursing facility    Barriers to Discharge        Equipment Recommendations  Defer to next venue    Recommendations for Other Services    Frequency Min 5X/week   Plan Discharge plan remains appropriate;Frequency remains appropriate    Precautions / Restrictions Precautions Precautions: Back Precaution Comments: Educated on 3/3 back precautions Required Braces or Orthoses: Spinal Brace Spinal Brace: Applied in sitting position   Pertinent Vitals/Pain C/o 7/10 back pain    Mobility  Bed Mobility Rolling Left: 4: Min assist Left Sidelying to Sit: 4: Min assist Details for Bed Mobility Assistance: (A) to initiate to elevate shoulders OOB with max cues for proper technique.  (A) to initiate roll with use of rail. Transfers Transfers: Sit to Stand;Stand to Sit Sit to Stand: 3: Mod assist;From bed Stand to Sit: 3: Mod assist;To chair/3-in-1 Details for Transfer Assistance: (A) to initiate transfer with max cues for hand placement.   Ambulation/Gait Ambulation/Gait Assistance: 4: Min assist Ambulation Distance (Feet): 40 Feet Assistive device: Rolling walker Ambulation/Gait Assistance Details: (A) for safety with cues for RW placement and proper body position within RW. Gait Pattern: Decreased stride length;Shuffle;Narrow base of support;Decreased trunk rotation Gait velocity: decrease gait speed    Exercises     PT Diagnosis:    PT Problem List:   PT Treatment Interventions:     PT Goals Acute Rehab PT Goals PT Goal Formulation: With patient Time For Goal Achievement: 09/28/11 Potential to Achieve  Goals: Good Pt will Roll Supine to Right Side: with min assist PT Goal: Rolling Supine to Right Side - Progress: Met Pt will go Supine/Side to Sit: with min assist PT Goal: Supine/Side to Sit - Progress: Met Pt will go Sit to Supine/Side: with min assist PT Goal: Sit to Supine/Side - Progress: Met Pt will go Sit to Stand: with min assist PT Goal: Sit to Stand - Progress: Met Pt will go Stand to Sit: with min assist PT Goal: Stand to Sit - Progress: Met Pt will Stand: with supervision;1 - 2 min PT Goal: Stand - Progress: Progressing toward goal Pt will Ambulate: 16 - 50 feet;with min assist;with rolling walker PT Goal: Ambulate - Progress: Partly met  Visit Information  Last PT Received On: 09/20/11 Assistance Needed: +2 (safety due to overall fatigue and SaO2)    Subjective Data      Cognition  Overall Cognitive Status: Impaired Area of Impairment: Safety/judgement;Awareness of errors;Problem solving Arousal/Alertness: Awake/alert Orientation Level: Appears intact for tasks assessed Behavior During Session: [facility] for tasks performed Current Attention Level: Selective Memory: Decreased recall of precautions Safety/Judgement: Decreased awareness of safety precautions;Decreased safety judgement for tasks assessed Awareness of Errors: Assistance required to identify errors made;Assistance required to correct errors made Problem Solving: Pt slow to process at times and needs extra time to complete task. Question due to pain.    Balance  Balance Balance Assessed: Yes Static Sitting Balance Static Sitting - Balance Support: Feet supported Static Sitting - Level of Assistance: 5: Stand by assistance Static Sitting - Comment/# of Minutes: ~5 minutes;  Need total (A) to donn back brace  End of Session PT -  End of Session Equipment Utilized During Treatment: Gait belt;Back brace Activity Tolerance: Patient limited by fatigue Patient left: in chair;with call bell/phone within  reach;Other (comment) (on 3n1 however call bell present and RN aware) Nurse Communication: Mobility status   GP     Anarie Kalish 09/20/2011, 2:32 PM Jake Shark, PT DPT [phone]

## 2011-09-20 NOTE — Progress Notes (Signed)
Name: [name] MRN: [id number] DOB: Dec 21, 1945    LOS: 7 Date of admit 09/13/2011   Referring Provider:  Venetia Maxon Reason for Referral:  Hypotension, anuria.  PULMONARY / CRITICAL CARE MEDICINE  HPI:  66 yo female admitted on 09/13/2011 with pathologic lumbar spinal fracture with cord compression.  She had posterior lumbar decompression T11-L2 with pedicle screw fixation T10-L3 with posterolateral arthrodesis kyphoplasty 6/25.  She developed hypotension and anuria post-op, and PCCM consulted 6/26.  Events Since Admission: 6/26 Hypotension, anuria 6/27 Confusion 6/29: Intermittently confused.    Current Status: Worked with PT, oriented  Vital Signs: Temp:  [98.4 F (36.9 C)-99.4 F (37.4 C)] 98.8 F (37.1 C) (07/02 0326) Pulse Rate:  [65-91] 65  (07/02 0700) Resp:  [7-22] 16  (07/02 0700) BP: (112-155)/(53-125) 137/66 mmHg (07/02 0700) SpO2:  [91 %-99 %] 95 % (07/02 0734)  Physical Examination: General: No distress Neuro: RASS 0. CAM-ICU neg, follows commands, equal strength lowers HEENT: no oral exudate Neck: supple Cardiovascular: S1S2 regular Lungs: CTA Abdomen:  Soft, non tender Musculoskeletal:  No edema Skin: no rashes  No results found.   Lab 09/18/11 0605 09/17/11 0358 09/16/11 0500  HGB 10.6* 7.7* 7.0*  HCT 31.3* 23.1* 21.1*  WBC 5.4 4.6 6.3  PLT 106* 84* 93*    Lab 09/18/11 0605 09/17/11 0358 09/16/11 0500 09/15/11 0420 09/14/11 0925  NA 142 143 139 136 135  K 3.9 4.0 -- -- --  CL 109 110 107 105 103  CO2 24 24 23 23 23   GLUCOSE 88 95 138* 157* 117*  BUN 18 14 15 21 21   CREATININE 1.61* 1.59* 1.81* 1.92* 1.95*  CALCIUM 8.5 7.8* 8.4 8.1* 7.5*  MG -- 1.7 2.0 1.5 --  PHOS -- 3.6 2.6 2.9 --   No results found for this basename: PHART:5,PCO2:5,PCO2ART:5,PO2:5,PO2ART:5,HCO3:5,TCO2:5,O2SAT:5 in the last 168 hours   Lab 09/14/11 1015  PROCALCITON 0.14    Lab 09/20/11 0315  PROBNP 28054.0*     ASSESSMENT AND PLAN  PULMONARY  A: Hx of  asthma, rhinitis.  Stable on 09/19/2011  P:   symbicort albuterol to prn Remain off  atrovent, pulmicort Continue flonase, loratadine No recent pcxr IS as on small amount O2  CARDIOVASCULAR  Lines:  Lt IJ CVL 6/26>>  Echo 6/27>>EF 65 to 70%, grade 1 diastolic dysfx, mild LA dilation.  A: Hypotension, resolved.  Hx of HTN, hyperlipidemia. P:  Consider kvo IV fluids at 50 cc/h Hold anti-HTN meds, sys remains 120 Continue atorvastatin Consider dc line neck cvp x 1 for volume status in setting continued renal failure  RENAL  Lab 09/18/11 0605 09/17/11 0358 09/16/11 0500 09/15/11 0420 09/14/11 0925  NA 142 143 139 136 135  K 3.9 4.0 -- -- --  CL 109 110 107 105 103  CO2 24 24 23 23 23   BUN 18 14 15 21 21   CREATININE 1.61* 1.59* 1.81* 1.92* 1.95*  CALCIUM 8.5 7.8* 8.4 8.1* 7.5*  MG -- 1.7 2.0 1.5 --  PHOS -- 3.6 2.6 2.9 --   Intake/Output      07/01 0701 - 07/02 0700 07/02 0701 - 07/03 0700   P.O. 100    I.V. (mL/kg) 1200 (12.9)    Total Intake(mL/kg) 1300 (14)    Urine (mL/kg/hr) 655 (0.3)    Total Output 655    Net +645         Urine Occurrence 3 x    Stool Occurrence 2 x     Foley:  6/26>> Renal u/s 6/26>>no abnormalities, no hydronephrosis  A: Acute renal failure, oliguria/anuria.  Improved with volume resuscitation.  Baseline creatinine 0.91 from 09/12/11.  remains with crt 1.6 range  P:   kvo fluids cvp to better define volume status Likely atn, hope with hemodynamic correction will resolve over next 48 hrs? renal US reviewed Chem in am  Bun / crt ratio = renal at this stage, supports kvo, see bnp as well Med list reviewed for nephrotoxic agents  GASTROINTESTINAL  A: Nutrition. P:   Regular diet tolerated  HEMATOLOGIC  Lab 09/18/11 0605 09/17/11 0358 09/16/11 0500 09/15/11 0420 09/14/11 0925  HGB 10.6* 7.7* 7.0* 7.7* 8.3*  HCT 31.3* 23.1* 21.1* 23.0* 25.8*  PLT 106* 84* 93* 78* 100*  INR -- -- -- 1.35 --  APTT -- -- -- 32 --   A:  Anemia,  thrombocytopenia.  No evidence for bleeding.PRBC transfusion ordered by neurosurgery 6/29 P:  F/u CBC in am , last noted 6/30 with continued renal failure Transfuse for Hb < 7  INFECTIOUS  A:  No evidence for infection.  ENDOCRINE  A: Hyperglycemia.  No hx of DM. controlled P:   Monitor glucose on BMET  NEUROLOGIC  A: Lumbar spine fracture s/p surgery 6/25. P:   Post-op care, pain control per neurosurgery.  A: Confusion.  Likely from narcotics.  Improved.  CT head 6/27>>mild atrophy and chronic microvascular ischemia.  Hx of depression.  - normal mental status 09/19/2011  P: Limit benzo's, narcotics as tolerated Continue fluoxetine, lamotrigine, lyrica Pt consulted, chair successful  BEST PRACTICE / DISPOSITION  DVT Px: SCD GI Px:  Protonix  Ezri Landers J. Tyson Alias, MD, FACP Pgr: [phone] [place] [facility]

## 2011-09-21 ENCOUNTER — Inpatient Hospital Stay (HOSPITAL_COMMUNITY): Payer: Medicare Other

## 2011-09-21 DIAGNOSIS — R0989 Other specified symptoms and signs involving the circulatory and respiratory systems: Secondary | ICD-10-CM

## 2011-09-21 DIAGNOSIS — R0600 Dyspnea, unspecified: Secondary | ICD-10-CM | POA: Diagnosis not present

## 2011-09-21 LAB — CBC WITH DIFFERENTIAL/PLATELET
Hemoglobin: 11 g/dL — ABNORMAL LOW (ref 12.0–15.0)
Lymphocytes Relative: 19 % (ref 12–46)
Lymphs Abs: 1.2 10*3/uL (ref 0.7–4.0)
Monocytes Relative: 12 % (ref 3–12)
Neutro Abs: 4.2 10*3/uL (ref 1.7–7.7)
Neutrophils Relative %: 65 % (ref 43–77)
RBC: 3.78 MIL/uL — ABNORMAL LOW (ref 3.87–5.11)

## 2011-09-21 LAB — GLUCOSE, CAPILLARY

## 2011-09-21 LAB — BASIC METABOLIC PANEL
GFR calc Af Amer: 45 mL/min — ABNORMAL LOW (ref >90)
GFR calc non Af Amer: 39 mL/min — ABNORMAL LOW (ref >90)
Potassium: 3.4 mEq/L — ABNORMAL LOW (ref 3.5–5.1)
Sodium: 142 mEq/L (ref 135–145)

## 2011-09-21 MED ORDER — FUROSEMIDE 10 MG/ML IJ SOLN
40.0000 mg | Freq: Once | INTRAMUSCULAR | Status: DC
  Filled 2011-09-21: qty 4

## 2011-09-21 MED ORDER — BUMETANIDE 0.25 MG/ML IJ SOLN
1.0000 mg | Freq: Once | INTRAMUSCULAR | Status: AC
  Administered 2011-09-21: 1 mg via INTRAVENOUS
  Filled 2011-09-21: qty 4

## 2011-09-21 MED ORDER — POTASSIUM CHLORIDE CRYS ER 20 MEQ PO TBCR
40.0000 meq | EXTENDED_RELEASE_TABLET | Freq: Once | ORAL | Status: AC
  Administered 2011-09-21: 40 meq via ORAL
  Filled 2011-09-21: qty 2

## 2011-09-21 NOTE — Progress Notes (Signed)
Nurse asked to contact critical care in reference to pts. Wheezing by Dr. Venetia Maxon. Critical care made aware. Pt on there rounding list. Pt currently under no s/s distress.

## 2011-09-21 NOTE — Progress Notes (Signed)
Subjective: Patient reports "I've just been having a little trouble with my lungs"  Objective: Vital signs in last 24 hours: Temp:  [98.5 F (36.9 C)-98.9 F (37.2 C)] 98.5 F (36.9 C) (07/03 0600) Pulse Rate:  [73-81] 74  (07/03 0600) Resp:  [14-21] 20  (07/03 0600) BP: (114-160)/(64-123) 160/81 mmHg (07/03 0600) SpO2:  [92 %-97 %] 92 % (07/03 0600)  Intake/Output from previous day: 07/02 0701 - 07/03 0700 In: 444 [P.O.:240; I.V.:204] Out: 400 [Urine:400] Intake/Output this shift:    Awake, conversant. (Incision eval deferred as bed pan in use) Good strength BLE. Some dyspnea noted - awaiting her inhaler.  Lab Results:  [facility] 09/21/11 0628  WBC 6.4  HGB 11.0*  HCT 32.9*  PLT 153   BMET No results found for this basename: NA:2,K:2,CL:2,CO2:2,GLUCOSE:2,BUN:2,CREATININE:2,CALCIUM:2 in the last 72 hours  Studies/Results: No results found.  Assessment/Plan: Improving slowly  LOS: 8 days  Continue to mobilize. Will reassess breathing for baseline when sitting upright & after inhaler.    Georgiann Cocker 09/21/2011, 8:12 AM

## 2011-09-21 NOTE — Progress Notes (Signed)
Agree with above 

## 2011-09-21 NOTE — Progress Notes (Signed)
Occupational Therapy Treatment Patient Details Name: [name] MRN: [id number] DOB: 1945/09/18 Today's Date: 09/21/2011 Time: 9147-8295 OT Time Calculation (min): 41 min  OT Assessment / Plan / Recommendation Comments on Treatment Session Pt. progresssing with functional mobility and ADLs.  Currently requires mod A overall for ADLs    Follow Up Recommendations  Skilled nursing facility    Barriers to Discharge       Equipment Recommendations  Defer to next venue    Recommendations for Other Services    Frequency Min 2X/week   Plan Discharge plan remains appropriate    Precautions / Restrictions Precautions Precautions: Back Precaution Comments: Patient able to recall all precautions. Needs cues to maintain throughout mobility and ADLs Required Braces or Orthoses: Spinal Brace Spinal Brace: Applied in sitting position;Lumbar corset (required max A)   Pertinent Vitals/Pain     ADL  Lower Body Dressing: Performed;Maximal assistance Where Assessed - Lower Body Dressing: Supported sit to stand Toilet Transfer: Performed;Minimal Dentist Method: Sit to Barista: Raised toilet seat with arms (or 3-in-1 over toilet) Toileting - Clothing Manipulation and Hygiene: Performed;Maximal assistance Where Assessed - Glass blower/designer Manipulation and Hygiene: Standing Equipment Used: Back brace;Rolling walker Transfers/Ambulation Related to ADLs: Ambulated to BR and back with min A.  Audible wheezing noted.  pt. thought she had been incontinent while ambulating, but had not ADL Comments: Pt. requires mod verbal cues for back precautions.  Requires max cues to don brace    OT Diagnosis:    OT Problem List:   OT Treatment Interventions:     OT Goals ADL Goals ADL Goal: Toilet Transfer - Progress: Progressing toward goals ADL Goal: Toileting - Clothing Manipulation - Progress: Progressing toward goals ADL Goal: Additional Goal #1 - Progress:  Progressing toward goals  Visit Information  Last OT Received On: 09/21/11 Assistance Needed: +1    Subjective Data      Prior Functioning       Cognition  Overall Cognitive Status: Impaired Area of Impairment: Safety/judgement;Awareness of errors Arousal/Alertness: Awake/alert Orientation Level: Appears intact for tasks assessed Behavior During Session: [facility] for tasks performed Current Attention Level: Selective Memory: Decreased recall of precautions Safety/Judgement: Decreased awareness of safety precautions;Decreased safety judgement for tasks assessed Awareness of Errors: Assistance required to identify errors made;Assistance required to correct errors made Problem Solving: Pt slow to process at times and needs extra time to complete task. Question due to pain.    Mobility Bed Mobility Bed Mobility: Rolling Left;Left Sidelying to Sit Rolling Right: 5: Supervision;With rail Right Sidelying to Sit: 4: Min assist;With rails Sitting - Scoot to Edge of Bed: 4: Min assist Transfers Transfers: Sit to Stand;Stand to Sit Sit to Stand: 4: Min assist;With upper extremity assist;From chair/3-in-1;From bed Stand to Sit: 4: Min assist;With upper extremity assist;To bed;To chair/3-in-1 Details for Transfer Assistance: cues for hand placement   Exercises    Balance    End of Session OT - End of Session Equipment Utilized During Treatment: Back brace Activity Tolerance: Patient tolerated treatment well Patient left: in bed;with call bell/phone within reach  GO     Lacora Folmer M 09/21/2011, 6:29 PM

## 2011-09-21 NOTE — Progress Notes (Signed)
Name: [name] MRN: [id number] DOB: 14-Oct-1945    LOS: 8 Date of admit 09/13/2011   Referring Provider:  Venetia Maxon Reason for Referral:  Hypotension, anuria.  PULMONARY / CRITICAL CARE MEDICINE  HPI:  66 y/o female admitted on 09/13/2011 with pathologic lumbar spinal fracture with cord compression.  She had posterior lumbar decompression T11-L2 with pedicle screw fixation T10-L3 with posterolateral arthrodesis kyphoplasty 6/25.  She developed hypotension and anuria post-op, and PCCM consulted 6/26.  Events Since Admission: 6/26 Hypotension, anuria 6/27 Confusion 6/29: Intermittently confused.  7/3 - on floor, stable 7/4 - feels better, off O2   Current Status: VSS, no acute events, ambulated in hall.  Breathing improved. Continues to have burning with urination, frequency.  UA noted.    Vital Signs: Temp:  [98.5 F (36.9 C)-98.8 F (37.1 C)] 98.8 F (37.1 C) (07/03 0947) Pulse Rate:  [73-85] 85  (07/03 0947) Resp:  [15-21] 20  (07/03 0947) BP: (126-160)/(71-89) 146/83 mmHg (07/03 0947) SpO2:  [92 %-97 %] 94 % (07/03 0947)  Physical Examination: General: No distress Neuro: RASS 0. MAE, equal strength lowers HEENT: no oral exudate Neck: supple Cardiovascular: S1S2 regular Lungs: CTA Abdomen:  Soft, non tender Musculoskeletal:  No edema Skin: no rashes  No results found.   Lab 09/21/11 0628 09/18/11 0605 09/17/11 0358  HGB 11.0* 10.6* 7.7*  HCT 32.9* 31.3* 23.1*  WBC 6.4 5.4 4.6  PLT 153 106* 84*    Lab 09/21/11 0628 09/18/11 0605 09/17/11 0358 09/16/11 0500 09/15/11 0420  NA 142 142 143 139 136  K 3.4* 3.9 -- -- --  CL 108 109 110 107 105  CO2 24 24 24 23 23   GLUCOSE 96 88 95 138* 157*  BUN 16 18 14 15 21   CREATININE 1.37* 1.61* 1.59* 1.81* 1.92*  CALCIUM 8.6 8.5 7.8* 8.4 8.1*  MG -- -- 1.7 2.0 1.5  PHOS -- -- 3.6 2.6 2.9    Lab 09/20/11 0315  PROBNP 28054.0*     ASSESSMENT AND PLAN  PULMONARY  A: Hx of asthma, rhinitis.    P:    symbicort albuterol to prn Remain off  atrovent, pulmicort Continue flonase, loratadine No recent pcxr IS as on small amount O2 Diuresis--on bumex at home PRN, eval daily for need   CARDIOVASCULAR  Lines:  Lt IJ CVL 6/26>>out  Echo 6/27>>EF 65 to 70%, grade 1 diastolic dysfx, mild LA dilation.  A: Hypotension, resolved.  Hx of HTN, hyperlipidemia. P:  Resume home sular Continue atorvastatin   RENAL  Lab 09/21/11 0628 09/18/11 0605 09/17/11 0358 09/16/11 0500 09/15/11 0420  NA 142 142 143 139 136  K 3.4* 3.9 -- -- --  CL 108 109 110 107 105  CO2 24 24 24 23 23   BUN 16 18 14 15 21   CREATININE 1.37* 1.61* 1.59* 1.81* 1.92*  CALCIUM 8.6 8.5 7.8* 8.4 8.1*  MG -- -- 1.7 2.0 1.5  PHOS -- -- 3.6 2.6 2.9   Intake/Output      07/02 0701 - 07/03 0700 07/03 0701 - 07/04 0700   P.O. 240    I.V. (mL/kg) 204 (2.2)    Total Intake(mL/kg) 444 (4.8)    Urine (mL/kg/hr) 400 (0.2)    Total Output 400    Net +44         Urine Occurrence 2 x 2 x    Foley:  6/26>> Renal u/s 6/26>>no abnormalities, no hydronephrosis  A: Acute renal failure, oliguria/anuria.  Improved with volume resuscitation.  Baseline creatinine 0.91 from 09/12/11. remains with crt 1.6 range.  Improved 7/3.    P:   Chem in am  Med list reviewed for nephrotoxic agents  GASTROINTESTINAL  A: Nutrition. P:   Regular diet tolerated  HEMATOLOGIC  Lab 09/21/11 0628 09/18/11 0605 09/17/11 0358 09/16/11 0500 09/15/11 0420  HGB 11.0* 10.6* 7.7* 7.0* 7.7*  HCT 32.9* 31.3* 23.1* 21.1* 23.0*  PLT 153 106* 84* 93* 78*  INR -- -- -- -- 1.35  APTT -- -- -- -- 32   A:  Anemia, thrombocytopenia.  No evidence for bleeding.PRBC transfusion ordered by neurosurgery 6/29 P:  F/u CBC in am , last noted 6/30 with continued renal failure Transfuse for Hb < 7  INFECTIOUS  A:   UTI  P: -cipro for 3 days (stop date in place)    ENDOCRINE  A: Hyperglycemia.  No hx of DM. controlled P:   Monitor glucose on  BMET    NEUROLOGIC  A: Lumbar spine fracture s/p surgery 6/25. P:   Post-op care, pain control per neurosurgery.  A: Confusion.  Likely from narcotics.  Improved.  CT head 6/27>>mild atrophy and chronic microvascular ischemia.  Hx of depression.  - normal mental status 09/19/2011  P: Limit benzo's, narcotics as tolerated Continue fluoxetine, lamotrigine, lyrica PT consulted, chair successful   BEST PRACTICE / DISPOSITION  DVT Px: SCD GI Px:  Protonix   PCCM will sign off.  Please call PRN for needs.   Canary Brim, NP-C [facility] Pgr: [phone] or [phone]  I have seen and examined this patient with the nurse practionner and agree with the above assessment and plan.   Shan Levans Beeper  [phone]  Cell  [phone]  If no response or cell goes to voicemail, call beeper [phone]

## 2011-09-21 NOTE — Progress Notes (Addendum)
Name: [name] MRN: [id number] DOB: 1945/07/18    LOS: 8 Date of admit 09/13/2011   Referring Provider:  Venetia Maxon Reason for Referral:  Hypotension, anuria. PCP- Reece Agar  PULMONARY / CRITICAL CARE MEDICINE  HPI:  66 yo female admitted on 09/13/2011 with pathologic lumbar spinal fracture with cord compression.  She had posterior lumbar decompression T11-L2 with pedicle screw fixation T10-L3 with posterolateral arthrodesis kyphoplasty 6/25.  She developed hypotension and anuria post-op, and PCCM consulted 6/26.  Events Since Admission: 6/26 Hypotension, anuria 6/27 Confusion 6/29: Intermittently confused.    Current Status: C/o wheezing, 'not my asthma '  Reports similar episode in 2012 when 'I stopped breathing' - no details available  Vital Signs: Temp:  [98.5 F (36.9 C)-98.8 F (37.1 C)] 98.8 F (37.1 C) (07/03 0947) Pulse Rate:  [73-85] 85  (07/03 0947) Resp:  [15-21] 20  (07/03 0947) BP: (126-160)/(71-89) 146/83 mmHg (07/03 0947) SpO2:  [92 %-97 %] 94 % (07/03 0947)  Physical Examination: General: No distress, lying supine after lunch Neuro: RASS 0. CAM-ICU neg, follows commands, equal strength lowers HEENT: no oral exudate Neck: supple Cardiovascular: S1S2 regular Lungs: CTA, rhonchi on forced expiration Abdomen:  Soft, non tender Musculoskeletal:  No edema Skin: no rashes  No results found.   Lab 09/21/11 0628 09/18/11 0605 09/17/11 0358  HGB 11.0* 10.6* 7.7*  HCT 32.9* 31.3* 23.1*  WBC 6.4 5.4 4.6  PLT 153 106* 84*    Lab 09/21/11 0628 09/18/11 0605 09/17/11 0358 09/16/11 0500 09/15/11 0420  NA 142 142 143 139 136  K 3.4* 3.9 -- -- --  CL 108 109 110 107 105  CO2 24 24 24 23 23   GLUCOSE 96 88 95 138* 157*  BUN 16 18 14 15 21   CREATININE 1.37* 1.61* 1.59* 1.81* 1.92*  CALCIUM 8.6 8.5 7.8* 8.4 8.1*  MG -- -- 1.7 2.0 1.5  PHOS -- -- 3.6 2.6 2.9   No results found for this basename:  PHART:5,PCO2:5,PCO2ART:5,PO2:5,PO2ART:5,HCO3:5,TCO2:5,O2SAT:5 in the last 168 hours  No results found for this basename: PROCALCITON:5 in the last 168 hours  Lab 09/20/11 0315  PROBNP 28054.0*     ASSESSMENT AND PLAN  PULMONARY  A: Hx of asthma, rhinitis.  Stable on 09/19/2011  P:   resume symbicort albuterol nebs to prn Remain off  atrovent, pulmicort Continue flonase, loratadine pcxr in am IS as on small amount O2 Diurese  CARDIOVASCULAR  Lines:  Lt IJ CVL 6/26>>  Echo 6/27>>EF 65 to 70%, grade 1 diastolic dysfx, mild LA dilation.  A: Hypotension, resolved.  Hx of HTN, hyperlipidemia. P:   kvo IV fluids  Hold anti-HTN meds for now - resume 7/4 if Bp trends higher Continue atorvastatin  dc line neck   RENAL  Lab 09/21/11 0628 09/18/11 0605 09/17/11 0358 09/16/11 0500 09/15/11 0420  NA 142 142 143 139 136  K 3.4* 3.9 -- -- --  CL 108 109 110 107 105  CO2 24 24 24 23 23   BUN 16 18 14 15 21   CREATININE 1.37* 1.61* 1.59* 1.81* 1.92*  CALCIUM 8.6 8.5 7.8* 8.4 8.1*  MG -- -- 1.7 2.0 1.5  PHOS -- -- 3.6 2.6 2.9   Intake/Output      07/02 0701 - 07/03 0700 07/03 0701 - 07/04 0700   P.O. 240    I.V. (mL/kg) 204 (2.2)    Total Intake(mL/kg) 444 (4.8)    Urine (mL/kg/hr) 400 (0.2)    Total Output 400  Net +44         Urine Occurrence 2 x 2 x    Foley:  6/26>> Renal u/s 6/26>>no abnormalities, no hydronephrosis  A: Acute renal failure, oliguria/anuria.  Improved with volume resuscitation.  Baseline creatinine 0.91 from 09/12/11. -resolving ATN  P:   BNp high, 11LPOS Lasix 40 IV now, replete K & rechk ina m Med list reviewed for nephrotoxic agents   HEMATOLOGIC  Lab 09/21/11 0628 09/18/11 0605 09/17/11 0358 09/16/11 0500 09/15/11 0420  HGB 11.0* 10.6* 7.7* 7.0* 7.7*  HCT 32.9* 31.3* 23.1* 21.1* 23.0*  PLT 153 106* 84* 93* 78*  INR -- -- -- -- 1.35  APTT -- -- -- -- 32   A:  Anemia, thrombocytopenia.  No evidence for bleeding.PRBC transfusion  ordered by neurosurgery 6/29 P:  Transfuse for Hb < 7   NEUROLOGIC  A: Lumbar spine fracture s/p surgery 6/25. P:   Post-op care, pain control per neurosurgery.  A: Confusion.  Likely from narcotics.  Improved.  CT head 6/27>>mild atrophy and chronic microvascular ischemia.  Hx of depression.  - normal mental status 09/19/2011  P: Limit benzo's, narcotics as tolerated Continue fluoxetine, lamotrigine, lyrica Pt consulted, chair successful  BEST PRACTICE / DISPOSITION  DVT Px: SCD GI Px:  Protonix  Cyril Mourning MD. FCCP. [facility] Pager [phone] If no response call [phone]

## 2011-09-21 NOTE — Progress Notes (Signed)
Physical Therapy Treatment Patient Details Name: [name] MRN: [id number] DOB: [birth year] Today's Date: 09/21/2011 Time: 4098-1191 PT Time Calculation (min): 19 min  PT Assessment / Plan / Recommendation Comments on Treatment Session  Patient still limited by overall fatique but eager to get up and ambulate to restroom and get OOB.     Follow Up Recommendations  Skilled nursing facility    Barriers to Discharge        Equipment Recommendations  Defer to next venue    Recommendations for Other Services    Frequency Min 5X/week   Plan Discharge plan remains appropriate;Frequency remains appropriate    Precautions / Restrictions Precautions Precautions: Back Precaution Comments: Patient able to recall all precautions. Needs cues to maintain throughout mobility Required Braces or Orthoses: Spinal Brace Spinal Brace: Applied in sitting position;Lumbar corset   Pertinent Vitals/Pain     Mobility  Bed Mobility Rolling Right: 4: Min assist Right Sidelying to Sit: 4: Min assist;With rails Sitting - Scoot to Edge of Bed: 4: Min guard Details for Bed Mobility Assistance: Patient with heavy reliance on rails and A to sit into full upright positioning. Cues for safe technique throughout Transfers Sit to Stand: 4: Min assist;With upper extremity assist;From chair/3-in-1;From bed Stand to Sit: 4: Min assist;With upper extremity assist;To chair/3-in-1 Details for Transfer Assistance: A for balance and to control descent into recliner. Cues for safe technique and hand placement as patient want to pull up using RW Ambulation/Gait Ambulation/Gait Assistance: 4: Min assist;Other (comment) (one to follow for safety) Ambulation Distance (Feet): 40 Feet Assistive device: Rolling walker Ambulation/Gait Assistance Details: Cues for safety and positioning inside of RW. Cues for posture. A with RW Gait Pattern: Step-through pattern;Decreased stride length;Trunk flexed    Exercises     PT  Diagnosis:    PT Problem List:   PT Treatment Interventions:     PT Goals Acute Rehab PT Goals PT Goal: Rolling Supine to Right Side - Progress: Progressing toward goal PT Goal: Supine/Side to Sit - Progress: Progressing toward goal PT Goal: Sit to Stand - Progress: Progressing toward goal PT Goal: Stand to Sit - Progress: Progressing toward goal PT Transfer Goal: Bed to Chair/Chair to Bed - Progress: Progressing toward goal PT Goal: Ambulate - Progress: Progressing toward goal (need to see consistency)  Visit Information  Last PT Received On: 09/21/11 Assistance Needed: +2 (safety; follow with chair)    Subjective Data      Cognition  Overall Cognitive Status: Appears within functional limits for tasks assessed/performed Arousal/Alertness: Awake/alert Orientation Level: Appears intact for tasks assessed Behavior During Session: [facility] for tasks performed    Balance     End of Session PT - End of Session Equipment Utilized During Treatment: Gait belt;Back brace Activity Tolerance: Patient tolerated treatment well Patient left: in chair Nurse Communication: Mobility status   GP     Decarla, Siemen 09/21/2011, 12:24 PM  09/21/2011 Fredrich Birks PTA [phone] pager [phone] office

## 2011-09-22 LAB — BASIC METABOLIC PANEL
Chloride: 110 mEq/L (ref 96–112)
GFR calc Af Amer: 51 mL/min — ABNORMAL LOW (ref >90)
Potassium: 3.2 mEq/L — ABNORMAL LOW (ref 3.5–5.1)
Sodium: 143 mEq/L (ref 135–145)

## 2011-09-22 LAB — URINALYSIS, ROUTINE W REFLEX MICROSCOPIC
Bilirubin Urine: NEGATIVE
Hgb urine dipstick: NEGATIVE
Specific Gravity, Urine: 1.012 (ref 1.005–1.030)
Urobilinogen, UA: 0.2 mg/dL (ref 0.0–1.0)
pH: 5 (ref 5.0–8.0)

## 2011-09-22 LAB — URINE MICROSCOPIC-ADD ON

## 2011-09-22 LAB — GLUCOSE, CAPILLARY: Glucose-Capillary: 108 mg/dL — ABNORMAL HIGH (ref 70–99)

## 2011-09-22 MED ORDER — OXYCODONE-ACETAMINOPHEN 5-325 MG PO TABS
1.0000 | ORAL_TABLET | Freq: Four times a day (QID) | ORAL | Status: AC | PRN
Start: 2011-09-22 — End: 2011-10-02

## 2011-09-22 MED ORDER — NISOLDIPINE ER 17 MG PO TB24
17.0000 mg | ORAL_TABLET | Freq: Every day | ORAL | Status: DC
  Administered 2011-09-22: 17 mg via ORAL
  Filled 2011-09-22: qty 1

## 2011-09-22 MED ORDER — CIPROFLOXACIN HCL 250 MG PO TABS
250.0000 mg | ORAL_TABLET | Freq: Two times a day (BID) | ORAL | Status: DC
  Administered 2011-09-22: 250 mg via ORAL
  Filled 2011-09-22 (×3): qty 1

## 2011-09-22 MED ORDER — BUDESONIDE-FORMOTEROL FUMARATE 80-4.5 MCG/ACT IN AERO: 2.0000 | INHALATION_SPRAY | Freq: Two times a day (BID) | RESPIRATORY_TRACT | Status: DC

## 2011-09-22 NOTE — Progress Notes (Signed)
Physical Therapy Treatment Patient Details Name: [name] MRN: [id number] DOB: 04/18/1945 Today's Date: 09/22/2011 Time: 4098-1191 PT Time Calculation (min): 28 min  PT Assessment / Plan / Recommendation Comments on Treatment Session  Pt able to increase ambulation this session however continues to c/o fatigue.  Pt very emotional at end of session when discussing inhaler.  Pt reported not having inhaler for two days and wants her inhaler not breathing treatment.  RN aware and calling respiratory therapist to address.  Continue to recommend SNF at d/c.    Follow Up Recommendations  Skilled nursing facility    Barriers to Discharge        Equipment Recommendations  Defer to next venue    Recommendations for Other Services    Frequency Min 5X/week   Plan Discharge plan remains appropriate;Frequency remains appropriate    Precautions / Restrictions Precautions Precautions: Back Precaution Comments: Patient able to recall all precautions. Needs cues to maintain throughout mobility and ADLs Required Braces or Orthoses: Spinal Brace Spinal Brace: Applied in sitting position;Lumbar corset   Pertinent Vitals/Pain 5/10 back pain and mild abdomen pain     Mobility  Bed Mobility Bed Mobility: Rolling Right;Right Sidelying to Sit Rolling Right: 5: Supervision;With rail Right Sidelying to Sit: 4: Min assist;With rails Details for Bed Mobility Assistance: Pt continues to use rail to (A) with all bed mobility.  VCs for safe technique and to prevent twisting. Transfers Transfers: Sit to Stand;Stand to Sit Sit to Stand: 4: Min guard;From bed Stand to Sit: 4: Min assist;To chair/3-in-1 Details for Transfer Assistance: (A) to slowly descend to chair with max cues for hand placement.   Ambulation/Gait Ambulation/Gait Assistance: 4: Min guard Ambulation Distance (Feet): 80 Feet Assistive device: Rolling walker Ambulation/Gait Assistance Details: Minguard for safety with max cues for body  position within RW and RW placement especially with turns. Stairs: No    Exercises     PT Diagnosis:    PT Problem List:   PT Treatment Interventions:     PT Goals Acute Rehab PT Goals PT Goal Formulation: With patient Time For Goal Achievement: 09/28/11 Potential to Achieve Goals: Good Pt will Roll Supine to Right Side: with min assist PT Goal: Rolling Supine to Right Side - Progress: Met Pt will go Supine/Side to Sit: with min assist PT Goal: Supine/Side to Sit - Progress: Progressing toward goal Pt will go Sit to Supine/Side: with min assist PT Goal: Sit to Supine/Side - Progress: Met Pt will go Sit to Stand: with min assist PT Goal: Sit to Stand - Progress: Met Pt will go Stand to Sit: with min assist PT Goal: Stand to Sit - Progress: Met Pt will Ambulate: 16 - 50 feet;with min assist;with rolling walker PT Goal: Ambulate - Progress: Partly met  Visit Information  Last PT Received On: 09/22/11 Assistance Needed: +1    Subjective Data      Cognition  Overall Cognitive Status: Impaired Area of Impairment: Safety/judgement;Awareness of errors Arousal/Alertness: Awake/alert Orientation Level: Appears intact for tasks assessed Behavior During Session: [facility] for tasks performed Current Attention Level: Selective Memory: Decreased recall of precautions Safety/Judgement: Decreased awareness of safety precautions;Decreased safety judgement for tasks assessed Awareness of Errors: Assistance required to identify errors made;Assistance required to correct errors made Cognition - Other Comments: Pt very emotional at end of session.  Stating "I just want them to bring my inhalers."  Spoke with RN and pt recieved breathing treatment this AM however pt does not want breathing treatment she  wants her inhaler.    Balance  Balance Balance Assessed: Yes Static Sitting Balance Static Sitting - Balance Support: Feet supported Static Sitting - Level of Assistance: 5: Stand by assistance   End of Session PT - End of Session Equipment Utilized During Treatment: Gait belt;Back brace Activity Tolerance: Patient tolerated treatment well Patient left: in chair Nurse Communication: Mobility status   GP     Shawne Eskelson 09/22/2011, 9:39 AM Jake Shark, PT DPT [phone]

## 2011-09-22 NOTE — Discharge Summary (Signed)
Physician Discharge Summary  Patient ID: [name] MRN: [id number] DOB/AGE: [birth year] 66 y.o.  Admit date: 09/13/2011 Discharge date: 09/22/2011  Admission Diagnoses:Lumbar one Fracture, Pathologic fracture, Kyphosis, Osteoporosis, cord compression   Discharge Diagnoses:Lumbar one Fracture, Pathologic fracture, Kyphosis, Osteoporosis, cord compression   Active Problems:  Spinal cord compression  S/P spinal surgery  Dehydration  Hypotension  Oliguria  Altered mental status  Acute renal failure  Dyspnea   Discharged Condition: good  Hospital Course: [name] was admitted and taken to the operating room where she underwent Decompression T11-L2 with Pedicle screw fixation T10-L3 with Posterolateral arthrodesis (N/A)  KYPHOPLASTY (N/A) Post op she had anuria probably due to hypovolemia post op. With hydration she improved and has had good renal function. She is moving all extremities well. Wound is clean, dry and without signs of infection.   Consults: pulmonary/intensive care  Significant Diagnostic Studies: none  Treatments: as above  Discharge Exam: Blood pressure 147/75, pulse 59, temperature 98.7 F (37.1 C), temperature source Oral, resp. rate 18, height 5' (1.524 m), weight 92.9 kg (204 lb 12.9 oz), SpO2 92.00%. Alert and oriented by 4.  Moving all extremities well.  Wound is clean and dry.  Disposition: 03-Skilled Nursing Facility   Medication List  As of 09/22/2011  2:21 PM   TAKE these medications         atorvastatin 20 MG tablet   Commonly known as: LIPITOR   Take 20 mg by mouth daily.      baclofen 10 MG tablet   Commonly known as: LIORESAL   Take 10 mg by mouth at bedtime.      bimatoprost 0.01 % Soln   Commonly known as: LUMIGAN   Place 1 drop into both eyes 2 (two) times daily.      brinzolamide 1 % ophthalmic suspension   Commonly known as: AZOPT   Place 1 drop into both eyes 2 (two) times daily.      budesonide-formoterol 160-4.5 MCG/ACT  inhaler   Commonly known as: SYMBICORT   Inhale 1-2 puffs into the lungs 2 (two) times daily. 2 puffs every morning and 1 puff every evening      bumetanide 2 MG tablet   Commonly known as: BUMEX   Take 4 mg by mouth daily as needed. For swelling      cetirizine 10 MG tablet   Commonly known as: ZYRTEC   Take 10 mg by mouth daily.      clonazePAM 0.5 MG tablet   Commonly known as: KLONOPIN   Take 0.5 mg by mouth 2 (two) times daily.      DEXILANT 60 MG capsule   Generic drug: dexlansoprazole   Take 60 mg by mouth every evening.      FLUoxetine 20 MG tablet   Commonly known as: PROZAC   Take 40 mg by mouth every morning. PT TAKES 2 CAPS FOR 40MG  DOSAGE      fluticasone 50 MCG/ACT nasal spray   Commonly known as: FLONASE   Place 2 sprays into the nose 2 (two) times daily.      HYDROcodone-acetaminophen 5-325 MG per tablet   Commonly known as: NORCO   Take 1 tablet by mouth every 4 (four) hours as needed. For pain      lamoTRIgine 150 MG tablet   Commonly known as: LAMICTAL   Take 75-150 mg by mouth 2 (two) times daily. Take 150 mg every morning and 75 mg every evening      levalbuterol  45 MCG/ACT inhaler   Commonly known as: XOPENEX HFA   Inhale 1-2 puffs into the lungs every 4 (four) hours as needed. For shortness of breath      methocarbamol 500 MG tablet   Commonly known as: ROBAXIN   Take 500 mg by mouth every 8 (eight) hours as needed. SPASMS      nisoldipine 17 MG 24 hr tablet   Commonly known as: SULAR   Take 17 mg by mouth every morning.      oxyCODONE-acetaminophen 5-325 MG per tablet   Commonly known as: PERCOCET   Take 1-2 tablets by mouth every 6 (six) hours as needed for pain.      potassium chloride 20 MEQ/15ML (10%) Soln   Take 6.67 mEq by mouth daily. Mixed in water      pregabalin 100 MG capsule   Commonly known as: LYRICA   Take 100-400 mg by mouth 2 (two) times daily. 100 mg every morning and 400 mg every evening      traZODone 100 MG tablet    Commonly known as: DESYREL   Take 100 mg by mouth at bedtime.           Follow-up Information    Follow up with [name],[name] D, MD. (call to make appt after discharge from skilled nursing facility)    Contact information:   [address], [address] [place] [address] [phone]          Signed: Carmela Hurt 09/22/2011, 2:21 PM

## 2011-09-22 NOTE — Clinical Social Work Note (Signed)
Pt is ready for discharge to [facility]. Facility has received discharge summary and is ready to accept pt. Pt is agreeable to discharge plan. [facility] will provide transportation. CSW is signing off as no further needs identified.   Dede Query, MSW, [facility] [phone]

## 2011-09-22 NOTE — Progress Notes (Signed)
Pt iv dc intact. Pt dc instructions provided in envelope and 2nd copy placed in hospital chart. Pt under no s/s distress.

## 2011-09-23 NOTE — Care Management Note (Signed)
    Page 1 of 1   09/23/2011     11:14:55 AM   CARE MANAGEMENT NOTE 09/23/2011  Patient:  [name], [name]   Account Number:  [id number]  Date Initiated:  09/23/2011  Documentation initiated by:  Onnie Boer  Subjective/Objective Assessment:   PT WAS ADMITTED WITH WEAKENSS     Action/Plan:   PROGRESSION OF CARE AND DISCHARGE PLANNING   Anticipated DC Date:  09/22/2011   Anticipated DC Plan:  SKILLED NURSING FACILITY  In-house referral  Clinical Social Worker      DC Planning Services  CM consult      Choice offered to / List presented to:             Status of service:  Completed, signed off Medicare Important Message given?   (If response is "NO", the following Medicare IM given date fields will be blank) Date Medicare IM given:   Date Additional Medicare IM given:    Discharge Disposition:  SKILLED NURSING FACILITY  Per UR Regulation:  Reviewed for med. necessity/level of care/duration of stay  If discussed at Long Length of Stay Meetings, dates discussed:    Comments:  09/23/11 Onnie Boer, RN, BSN 1114 PT WAS DC'D TO [facility]

## 2012-01-11 DIAGNOSIS — G252 Other specified forms of tremor: Secondary | ICD-10-CM | POA: Insufficient documentation

## 2012-01-11 DIAGNOSIS — R209 Unspecified disturbances of skin sensation: Secondary | ICD-10-CM | POA: Insufficient documentation

## 2012-03-06 ENCOUNTER — Emergency Department (HOSPITAL_COMMUNITY): Payer: Medicare Other

## 2012-03-06 ENCOUNTER — Encounter (HOSPITAL_COMMUNITY): Payer: Self-pay

## 2012-03-06 ENCOUNTER — Inpatient Hospital Stay (HOSPITAL_COMMUNITY)
Admission: EM | Admit: 2012-03-06 | Discharge: 2012-03-08 | DRG: 312 | Disposition: A | Discharge status: Home / Self Care | Payer: Medicare Other | Attending: Cardiology | Admitting: Cardiology

## 2012-03-06 DIAGNOSIS — L03119 Cellulitis of unspecified part of limb: Secondary | ICD-10-CM | POA: Diagnosis present

## 2012-03-06 DIAGNOSIS — F329 Major depressive disorder, single episode, unspecified: Secondary | ICD-10-CM | POA: Diagnosis present

## 2012-03-06 DIAGNOSIS — L0390 Cellulitis, unspecified: Secondary | ICD-10-CM | POA: Diagnosis present

## 2012-03-06 DIAGNOSIS — I498 Other specified cardiac arrhythmias: Secondary | ICD-10-CM | POA: Diagnosis present

## 2012-03-06 DIAGNOSIS — I739 Peripheral vascular disease, unspecified: Secondary | ICD-10-CM | POA: Diagnosis present

## 2012-03-06 DIAGNOSIS — N179 Acute kidney failure, unspecified: Secondary | ICD-10-CM

## 2012-03-06 DIAGNOSIS — Z888 Allergy status to other drugs, medicaments and biological substances status: Secondary | ICD-10-CM

## 2012-03-06 DIAGNOSIS — M129 Arthropathy, unspecified: Secondary | ICD-10-CM | POA: Diagnosis present

## 2012-03-06 DIAGNOSIS — I10 Essential (primary) hypertension: Secondary | ICD-10-CM | POA: Diagnosis present

## 2012-03-06 DIAGNOSIS — R55 Syncope and collapse: Principal | ICD-10-CM | POA: Diagnosis present

## 2012-03-06 DIAGNOSIS — K219 Gastro-esophageal reflux disease without esophagitis: Secondary | ICD-10-CM | POA: Diagnosis present

## 2012-03-06 DIAGNOSIS — Z882 Allergy status to sulfonamides status: Secondary | ICD-10-CM

## 2012-03-06 DIAGNOSIS — L02419 Cutaneous abscess of limb, unspecified: Secondary | ICD-10-CM | POA: Diagnosis present

## 2012-03-06 DIAGNOSIS — R0600 Dyspnea, unspecified: Secondary | ICD-10-CM | POA: Diagnosis present

## 2012-03-06 DIAGNOSIS — J45909 Unspecified asthma, uncomplicated: Secondary | ICD-10-CM | POA: Diagnosis present

## 2012-03-06 DIAGNOSIS — F411 Generalized anxiety disorder: Secondary | ICD-10-CM | POA: Diagnosis present

## 2012-03-06 DIAGNOSIS — F32A Depression, unspecified: Secondary | ICD-10-CM | POA: Diagnosis present

## 2012-03-06 DIAGNOSIS — F3289 Other specified depressive episodes: Secondary | ICD-10-CM | POA: Diagnosis present

## 2012-03-06 DIAGNOSIS — Z880 Allergy status to penicillin: Secondary | ICD-10-CM

## 2012-03-06 LAB — CBC WITH DIFFERENTIAL/PLATELET
Eosinophils Absolute: 0.1 10*3/uL (ref 0.0–0.7)
Eosinophils Relative: 2 % (ref 0–5)
Hemoglobin: 11.6 g/dL — ABNORMAL LOW (ref 12.0–15.0)
Lymphs Abs: 1.8 10*3/uL (ref 0.7–4.0)
MCH: 28 pg (ref 26.0–34.0)
MCHC: 32.6 g/dL (ref 30.0–36.0)
MCV: 85.8 fL (ref 78.0–100.0)
Monocytes Relative: 12 % (ref 3–12)
Platelets: 136 10*3/uL — ABNORMAL LOW (ref 150–400)
RBC: 4.15 MIL/uL (ref 3.87–5.11)

## 2012-03-06 LAB — MAGNESIUM: Magnesium: 2.2 mg/dL (ref 1.5–2.5)

## 2012-03-06 LAB — BASIC METABOLIC PANEL
CO2: 30 mEq/L (ref 19–32)
GFR calc non Af Amer: 85 mL/min — ABNORMAL LOW (ref >90)
Glucose, Bld: 114 mg/dL — ABNORMAL HIGH (ref 70–99)
Potassium: 3.9 mEq/L (ref 3.5–5.1)
Sodium: 140 mEq/L (ref 135–145)

## 2012-03-06 LAB — URINALYSIS, ROUTINE W REFLEX MICROSCOPIC
Leukocytes, UA: NEGATIVE
Nitrite: NEGATIVE
Specific Gravity, Urine: 1.017 (ref 1.005–1.030)
Urobilinogen, UA: 0.2 mg/dL (ref 0.0–1.0)
pH: 7.5 (ref 5.0–8.0)

## 2012-03-06 LAB — PRO B NATRIURETIC PEPTIDE: Pro B Natriuretic peptide (BNP): 291.1 pg/mL — ABNORMAL HIGH (ref 0–125)

## 2012-03-06 LAB — TROPONIN I: Troponin I: <0.3 ng/mL (ref <0.30)

## 2012-03-06 LAB — CBC
Hemoglobin: 11.9 g/dL — ABNORMAL LOW (ref 12.0–15.0)
MCHC: 32.3 g/dL (ref 30.0–36.0)
Platelets: 137 10*3/uL — ABNORMAL LOW (ref 150–400)
RBC: 4.25 MIL/uL (ref 3.87–5.11)

## 2012-03-06 MED ORDER — LAMOTRIGINE 150 MG PO TABS
150.0000 mg | ORAL_TABLET | Freq: Every day | ORAL | Status: DC
  Administered 2012-03-07 – 2012-03-08 (×2): 150 mg via ORAL
  Filled 2012-03-06 (×2): qty 1

## 2012-03-06 MED ORDER — BIMATOPROST 0.01 % OP SOLN
1.0000 [drp] | Freq: Two times a day (BID) | OPHTHALMIC | Status: DC
  Administered 2012-03-07 – 2012-03-08 (×3): 1 [drp] via OPHTHALMIC
  Filled 2012-03-06 (×2): qty 2.5

## 2012-03-06 MED ORDER — METHOCARBAMOL 500 MG PO TABS
500.0000 mg | ORAL_TABLET | Freq: Three times a day (TID) | ORAL | Status: DC | PRN
  Filled 2012-03-06: qty 1

## 2012-03-06 MED ORDER — BACLOFEN 10 MG PO TABS
10.0000 mg | ORAL_TABLET | Freq: Every day | ORAL | Status: DC
  Administered 2012-03-07 (×2): 10 mg via ORAL
  Filled 2012-03-06 (×3): qty 1

## 2012-03-06 MED ORDER — ASPIRIN EC 81 MG PO TBEC
81.0000 mg | DELAYED_RELEASE_TABLET | Freq: Every day | ORAL | Status: DC
  Administered 2012-03-07 – 2012-03-08 (×2): 81 mg via ORAL
  Filled 2012-03-06 (×2): qty 1

## 2012-03-06 MED ORDER — FLUTICASONE PROPIONATE 50 MCG/ACT NA SUSP
2.0000 | Freq: Two times a day (BID) | NASAL | Status: DC | PRN
  Administered 2012-03-08: 2 via NASAL
  Filled 2012-03-06: qty 16

## 2012-03-06 MED ORDER — CLONAZEPAM 0.5 MG PO TABS
0.5000 mg | ORAL_TABLET | Freq: Two times a day (BID) | ORAL | Status: DC
  Administered 2012-03-07 – 2012-03-08 (×3): 0.5 mg via ORAL
  Filled 2012-03-06 (×3): qty 1

## 2012-03-06 MED ORDER — BRINZOLAMIDE 1 % OP SUSP
1.0000 [drp] | Freq: Two times a day (BID) | OPHTHALMIC | Status: DC
Start: 2012-03-06 — End: 2012-03-06

## 2012-03-06 MED ORDER — PREGABALIN 100 MG PO CAPS
400.0000 mg | ORAL_CAPSULE | Freq: Every day | ORAL | Status: DC
  Administered 2012-03-07: 400 mg via ORAL
  Filled 2012-03-06: qty 4

## 2012-03-06 MED ORDER — DORZOLAMIDE HCL 2 % OP SOLN
1.0000 [drp] | Freq: Two times a day (BID) | OPHTHALMIC | Status: DC
  Administered 2012-03-07 – 2012-03-08 (×3): 1 [drp] via OPHTHALMIC
  Filled 2012-03-06: qty 10

## 2012-03-06 MED ORDER — SODIUM CHLORIDE 0.9 % IV SOLN: 250.0000 mL | INTRAVENOUS | Status: DC | PRN

## 2012-03-06 MED ORDER — LAMOTRIGINE 25 MG PO TABS
75.0000 mg | ORAL_TABLET | Freq: Every day | ORAL | Status: DC
  Administered 2012-03-07 (×2): 75 mg via ORAL
  Filled 2012-03-06 (×3): qty 3

## 2012-03-06 MED ORDER — LAMOTRIGINE 25 MG PO TABS: 75.0000 mg | ORAL_TABLET | Freq: Two times a day (BID) | ORAL | Status: DC

## 2012-03-06 MED ORDER — LORATADINE 10 MG PO TABS
10.0000 mg | ORAL_TABLET | Freq: Every day | ORAL | Status: DC
  Administered 2012-03-07 – 2012-03-08 (×2): 10 mg via ORAL
  Filled 2012-03-06 (×2): qty 1

## 2012-03-06 MED ORDER — NISOLDIPINE ER 17 MG PO TB24
17.0000 mg | ORAL_TABLET | Freq: Every day | ORAL | Status: DC
  Administered 2012-03-07 – 2012-03-08 (×2): 17 mg via ORAL
  Filled 2012-03-06 (×2): qty 1

## 2012-03-06 MED ORDER — ONDANSETRON HCL 4 MG/2ML IJ SOLN: 4.0000 mg | Freq: Four times a day (QID) | INTRAMUSCULAR | Status: DC | PRN

## 2012-03-06 MED ORDER — HEPARIN SODIUM (PORCINE) 5000 UNIT/ML IJ SOLN
5000.0000 [IU] | Freq: Three times a day (TID) | INTRAMUSCULAR | Status: DC
  Administered 2012-03-07 – 2012-03-08 (×4): 5000 [IU] via SUBCUTANEOUS
  Filled 2012-03-06 (×7): qty 1

## 2012-03-06 MED ORDER — CLINDAMYCIN PHOSPHATE 600 MG/50ML IV SOLN
600.0000 mg | Freq: Three times a day (TID) | INTRAVENOUS | Status: DC
  Administered 2012-03-07 – 2012-03-08 (×5): 600 mg via INTRAVENOUS
  Filled 2012-03-06 (×8): qty 50

## 2012-03-06 MED ORDER — PANTOPRAZOLE SODIUM 40 MG PO TBEC
40.0000 mg | DELAYED_RELEASE_TABLET | Freq: Every day | ORAL | Status: DC
  Administered 2012-03-07 – 2012-03-08 (×2): 40 mg via ORAL
  Filled 2012-03-06 (×2): qty 1

## 2012-03-06 MED ORDER — POTASSIUM CHLORIDE 20 MEQ/15ML (10%) PO LIQD
40.0000 meq | Freq: Every evening | ORAL | Status: DC
  Administered 2012-03-07: 40 meq via ORAL
  Filled 2012-03-06 (×2): qty 30

## 2012-03-06 MED ORDER — BUDESONIDE-FORMOTEROL FUMARATE 160-4.5 MCG/ACT IN AERO
1.0000 | INHALATION_SPRAY | Freq: Two times a day (BID) | RESPIRATORY_TRACT | Status: DC
Start: 2012-03-06 — End: 2012-03-08
  Administered 2012-03-07 (×2): 1 via RESPIRATORY_TRACT
  Administered 2012-03-07 – 2012-03-08 (×2): 2 via RESPIRATORY_TRACT
  Filled 2012-03-06: qty 6

## 2012-03-06 MED ORDER — ACETAMINOPHEN 325 MG PO TABS
650.0000 mg | ORAL_TABLET | ORAL | Status: DC | PRN
  Administered 2012-03-07: 650 mg via ORAL
  Filled 2012-03-06: qty 2

## 2012-03-06 MED ORDER — FLUOXETINE HCL 20 MG PO TABS
40.0000 mg | ORAL_TABLET | Freq: Every day | ORAL | Status: DC
  Administered 2012-03-07 – 2012-03-08 (×2): 40 mg via ORAL
  Filled 2012-03-06 (×2): qty 2

## 2012-03-06 MED ORDER — SODIUM CHLORIDE 0.9 % IJ SOLN
3.0000 mL | INTRAMUSCULAR | Status: DC | PRN
  Administered 2012-03-07: 3 mL via INTRAVENOUS

## 2012-03-06 MED ORDER — ATORVASTATIN CALCIUM 20 MG PO TABS
20.0000 mg | ORAL_TABLET | Freq: Every evening | ORAL | Status: DC
  Administered 2012-03-07: 20 mg via ORAL
  Filled 2012-03-06 (×2): qty 1

## 2012-03-06 MED ORDER — PREGABALIN 100 MG PO CAPS
100.0000 mg | ORAL_CAPSULE | Freq: Every day | ORAL | Status: DC
  Administered 2012-03-07 – 2012-03-08 (×2): 100 mg via ORAL
  Filled 2012-03-06 (×2): qty 1

## 2012-03-06 MED ORDER — SODIUM CHLORIDE 0.9 % IJ SOLN
3.0000 mL | Freq: Two times a day (BID) | INTRAMUSCULAR | Status: DC
  Administered 2012-03-06 – 2012-03-08 (×3): 3 mL via INTRAVENOUS

## 2012-03-06 MED ORDER — TRAZODONE HCL 100 MG PO TABS
100.0000 mg | ORAL_TABLET | Freq: Every day | ORAL | Status: DC
  Administered 2012-03-07 (×2): 100 mg via ORAL
  Filled 2012-03-06 (×3): qty 1

## 2012-03-06 MED ORDER — LEVALBUTEROL TARTRATE 45 MCG/ACT IN AERO
1.0000 | INHALATION_SPRAY | RESPIRATORY_TRACT | Status: DC | PRN
  Administered 2012-03-08: 2 via RESPIRATORY_TRACT
  Filled 2012-03-06: qty 15

## 2012-03-06 MED ORDER — NITROGLYCERIN 0.4 MG SL SUBL: 0.4000 mg | SUBLINGUAL_TABLET | SUBLINGUAL | Status: DC | PRN
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
## 2012-03-07 DIAGNOSIS — R55 Syncope and collapse: Secondary | ICD-10-CM

## 2012-03-07 LAB — PRO B NATRIURETIC PEPTIDE: Pro B Natriuretic peptide (BNP): 260.5 pg/mL — ABNORMAL HIGH (ref 0–125)

## 2012-03-07 LAB — COMPREHENSIVE METABOLIC PANEL
BUN: 13 mg/dL (ref 6–23)
Calcium: 9.1 mg/dL (ref 8.4–10.5)
GFR calc Af Amer: >90 mL/min (ref >90)
Glucose, Bld: 99 mg/dL (ref 70–99)
Total Protein: 6.6 g/dL (ref 6.0–8.3)

## 2012-03-07 LAB — GLUCOSE, CAPILLARY: Glucose-Capillary: 93 mg/dL (ref 70–99)

## 2012-03-07 LAB — MAGNESIUM: Magnesium: 2.1 mg/dL (ref 1.5–2.5)

## 2012-03-07 LAB — MRSA PCR SCREENING: MRSA by PCR: NEGATIVE

## 2012-03-07 LAB — TROPONIN I
Troponin I: <0.3 ng/mL (ref <0.30)
Troponin I: <0.3 ng/mL (ref <0.30)

## 2012-03-07 LAB — TSH: TSH: 2.03 u[IU]/mL (ref 0.350–4.500)

## 2012-03-07 NOTE — Progress Notes (Signed)
  Echocardiogram 2D Echocardiogram has been performed.  Jeyli Zwicker FRANCES 03/07/2012, 11:25 AM 

## 2012-03-07 NOTE — Progress Notes (Signed)
Utilization review completed.  

## 2012-03-07 NOTE — Progress Notes (Signed)
Patient ID: [name], female   DOB: 08/09/1945, 66 y.o.   MRN: [id number]    Subjective:  Denies SSCP, palpitations or Dyspnea Telemetry with NSR no heart block  Objective:  Filed Vitals:   03/06/12 2200 03/06/12 2322 03/07/12 0500 03/07/12 0831  BP: 121/89 147/71 112/61   Pulse:  62 52   Temp:  98 F (36.7 C) 98.1 F (36.7 C)   TempSrc:      Resp: 17 18 18    Height:  5' (1.524 m)    Weight:  196 lb 12.8 oz (89.268 kg)    SpO2:  94% 90% 93%    Intake/Output from previous day:  Intake/Output Summary (Last 24 hours) at 03/07/12 0845 Last data filed at 03/07/12 0700  Gross per 24 hour  Intake      0 ml  Output    600 ml  Net   -600 ml    Physical Exam: Affect appropriate Healthy:  appears stated age HEENT: normal Neck supple with no adenopathy JVP normal no bruits no thyromegaly Lungs clear with no wheezing and good diaphragmatic motion Heart:  S1/S2 no murmur, no rub, gallop or click PMI normal Abdomen: benighn, BS positve, no tenderness, no AAA no bruit.  No HSM or HJR Distal pulses intact with no bruits No edema Neuro non-focal Skin warm and dry No muscular weakness   Lab Results: Basic Metabolic Panel:  Basename 03/06/12 2322 03/06/12 1840  NA 141 140  K 4.0 3.9  CL 103 101  CO2 28 30  GLUCOSE 99 114*  BUN 13 16  CREATININE 0.68 0.75  CALCIUM 9.1 9.4  MG 2.1 2.2  PHOS -- --   Liver Function Tests:  [facility] 03/06/12 2322  AST 14  ALT 11  ALKPHOS 123*  BILITOT 0.3  PROT 6.6  ALBUMIN 3.7   No results found for this basename: LIPASE:2,AMYLASE:2 in the last 72 hours CBC:  Basename 03/06/12 2322 03/06/12 1840  WBC 5.2 4.7  NEUTROABS 2.7 --  HGB 11.6* 11.9*  HCT 35.6* 36.8  MCV 85.8 86.6  PLT 136* 137*   Cardiac Enzymes:  Basename 03/07/12 0410 03/06/12 2322 03/06/12 1842  CKTOTAL -- -- --  CKMB -- -- --  CKMBINDEX -- -- --  TROPONINI <0.30 <0.30 <0.30    Imaging: Dg Chest Port 1 View  03/06/2012  *RADIOLOGY REPORT*   Clinical Data: Syncope.  PORTABLE CHEST - 1 VIEW  Comparison: 09/21/2011  Findings: Low lung volumes are seen.  Both lungs are clear.  Heart size is stable.  IMPRESSION: Low lung volumes.  No acute findings.   Original Report Authenticated By: Myles Rosenthal, M.D.     Cardiac Studies:  ECG: SR LAFB no AV block   Telemetry:  SR/SB no AV block or VT  Echo:  09/15/11  EF 65% normal  Medications:     . aspirin EC  81 mg Oral Daily  . atorvastatin  20 mg Oral QPM  . baclofen  10 mg Oral QHS  . bimatoprost  1 drop Both Eyes BID  . budesonide-formoterol  1-2 puff Inhalation BID  . clindamycin (CLEOCIN) IV  600 mg Intravenous Q8H  . clonazePAM  0.5 mg Oral BID  . dorzolamide  1 drop Both Eyes BID  . FLUoxetine  40 mg Oral Daily  . heparin  5,000 Units Subcutaneous Q8H  . lamoTRIgine  150 mg Oral Daily   And  . lamoTRIgine  75 mg Oral QHS  . loratadine  10  mg Oral Daily  . nisoldipine  17 mg Oral Daily  . pantoprazole  40 mg Oral Daily  . potassium chloride  40 mEq Oral QPM  . pregabalin  100 mg Oral Q breakfast  . pregabalin  400 mg Oral QAC supper  . sodium chloride  3 mL Intravenous Q12H  . traZODone  100 mg Oral QHS       Assessment/Plan:  "Syncope"  Not clear that this is cardiac Has seen cardiologist in [place] before Chronic bradycardia  She indicates That she was out for and instant and blinkes her eyes.  Troponin negative Echo pending but ok in June Cellulitis:  Continue cleocin  Charlton Haws 03/07/2012, 8:45 AM

## 2012-03-08 ENCOUNTER — Encounter (HOSPITAL_COMMUNITY): Payer: Self-pay | Admitting: *Deleted

## 2012-03-08 DIAGNOSIS — L0390 Cellulitis, unspecified: Secondary | ICD-10-CM | POA: Diagnosis present

## 2012-03-08 DIAGNOSIS — F329 Major depressive disorder, single episode, unspecified: Secondary | ICD-10-CM | POA: Diagnosis present

## 2012-03-08 DIAGNOSIS — K219 Gastro-esophageal reflux disease without esophagitis: Secondary | ICD-10-CM | POA: Diagnosis present

## 2012-03-08 MED ORDER — CLINDAMYCIN HCL 300 MG PO CAPS: 300.0000 mg | ORAL_CAPSULE | Freq: Three times a day (TID) | ORAL | Status: DC
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
## 2012-06-20 ENCOUNTER — Inpatient Hospital Stay (HOSPITAL_COMMUNITY): Payer: Medicare Other

## 2012-06-20 ENCOUNTER — Encounter (HOSPITAL_COMMUNITY): Payer: Self-pay | Admitting: Internal Medicine

## 2012-06-20 ENCOUNTER — Encounter (HOSPITAL_COMMUNITY): Payer: Self-pay | Admitting: Anesthesiology

## 2012-06-20 ENCOUNTER — Inpatient Hospital Stay (HOSPITAL_COMMUNITY): Payer: Medicare Other | Admitting: Anesthesiology

## 2012-06-20 ENCOUNTER — Inpatient Hospital Stay (HOSPITAL_COMMUNITY)
Admission: EM | Admit: 2012-06-20 | Discharge: 2012-06-23 | DRG: 480 | Disposition: A | Discharge status: Skilled Nursing Facility | Payer: Medicare Other | Source: Other Acute Inpatient Hospital | Attending: Internal Medicine | Admitting: Internal Medicine

## 2012-06-20 ENCOUNTER — Encounter (HOSPITAL_COMMUNITY)
Admission: EM | Disposition: A | Discharge status: Skilled Nursing Facility | Payer: Self-pay | Source: Other Acute Inpatient Hospital | Attending: Internal Medicine

## 2012-06-20 DIAGNOSIS — J189 Pneumonia, unspecified organism: Secondary | ICD-10-CM | POA: Diagnosis present

## 2012-06-20 DIAGNOSIS — S7292XA Unspecified fracture of left femur, initial encounter for closed fracture: Secondary | ICD-10-CM

## 2012-06-20 DIAGNOSIS — Z91040 Latex allergy status: Secondary | ICD-10-CM

## 2012-06-20 DIAGNOSIS — Z9889 Other specified postprocedural states: Secondary | ICD-10-CM

## 2012-06-20 DIAGNOSIS — F341 Dysthymic disorder: Secondary | ICD-10-CM | POA: Diagnosis present

## 2012-06-20 DIAGNOSIS — Z79899 Other long term (current) drug therapy: Secondary | ICD-10-CM

## 2012-06-20 DIAGNOSIS — J45909 Unspecified asthma, uncomplicated: Secondary | ICD-10-CM | POA: Diagnosis present

## 2012-06-20 DIAGNOSIS — I10 Essential (primary) hypertension: Secondary | ICD-10-CM | POA: Diagnosis present

## 2012-06-20 DIAGNOSIS — Z882 Allergy status to sulfonamides status: Secondary | ICD-10-CM

## 2012-06-20 DIAGNOSIS — I739 Peripheral vascular disease, unspecified: Secondary | ICD-10-CM | POA: Diagnosis present

## 2012-06-20 DIAGNOSIS — Z880 Allergy status to penicillin: Secondary | ICD-10-CM

## 2012-06-20 DIAGNOSIS — Z888 Allergy status to other drugs, medicaments and biological substances status: Secondary | ICD-10-CM

## 2012-06-20 DIAGNOSIS — K5900 Constipation, unspecified: Secondary | ICD-10-CM | POA: Diagnosis not present

## 2012-06-20 DIAGNOSIS — Z91010 Allergy to peanuts: Secondary | ICD-10-CM

## 2012-06-20 DIAGNOSIS — D62 Acute posthemorrhagic anemia: Secondary | ICD-10-CM | POA: Diagnosis not present

## 2012-06-20 DIAGNOSIS — W19XXXA Unspecified fall, initial encounter: Secondary | ICD-10-CM | POA: Diagnosis present

## 2012-06-20 DIAGNOSIS — K219 Gastro-esophageal reflux disease without esophagitis: Secondary | ICD-10-CM | POA: Diagnosis present

## 2012-06-20 DIAGNOSIS — H409 Unspecified glaucoma: Secondary | ICD-10-CM | POA: Diagnosis present

## 2012-06-20 DIAGNOSIS — S72309A Unspecified fracture of shaft of unspecified femur, initial encounter for closed fracture: Principal | ICD-10-CM | POA: Diagnosis present

## 2012-06-20 DIAGNOSIS — D696 Thrombocytopenia, unspecified: Secondary | ICD-10-CM | POA: Diagnosis not present

## 2012-06-20 DIAGNOSIS — F329 Major depressive disorder, single episode, unspecified: Secondary | ICD-10-CM

## 2012-06-20 DIAGNOSIS — R338 Other retention of urine: Secondary | ICD-10-CM | POA: Diagnosis not present

## 2012-06-20 DIAGNOSIS — S7290XA Unspecified fracture of unspecified femur, initial encounter for closed fracture: Secondary | ICD-10-CM

## 2012-06-20 DIAGNOSIS — H269 Unspecified cataract: Secondary | ICD-10-CM | POA: Diagnosis present

## 2012-06-20 DIAGNOSIS — M25469 Effusion, unspecified knee: Secondary | ICD-10-CM | POA: Diagnosis present

## 2012-06-20 DIAGNOSIS — N179 Acute kidney failure, unspecified: Secondary | ICD-10-CM

## 2012-06-20 HISTORY — PX: IM NAILING FEMORAL SHAFT FRACTURE: SUR731

## 2012-06-20 HISTORY — PX: FEMUR IM NAIL: SHX1597

## 2012-06-20 LAB — BASIC METABOLIC PANEL
Chloride: 103 mEq/L (ref 96–112)
GFR calc Af Amer: 58 mL/min — ABNORMAL LOW (ref >90)
GFR calc non Af Amer: 50 mL/min — ABNORMAL LOW (ref >90)
Potassium: 4.4 mEq/L (ref 3.5–5.1)

## 2012-06-20 LAB — CBC
HCT: 35.6 % — ABNORMAL LOW (ref 36.0–46.0)
Hemoglobin: 11.7 g/dL — ABNORMAL LOW (ref 12.0–15.0)
RBC: 4.06 MIL/uL (ref 3.87–5.11)
WBC: 8.2 10*3/uL (ref 4.0–10.5)

## 2012-06-20 LAB — CREATININE, SERUM
Creatinine, Ser: 0.91 mg/dL (ref 0.50–1.10)
GFR calc Af Amer: 73 mL/min — ABNORMAL LOW (ref >90)
GFR calc non Af Amer: 63 mL/min — ABNORMAL LOW (ref >90)

## 2012-06-20 SURGERY — INSERTION, INTRAMEDULLARY ROD, FEMUR
Anesthesia: General | Site: Hip | Laterality: Left | Wound class: Clean

## 2012-06-20 MED ORDER — PANTOPRAZOLE SODIUM 40 MG PO TBEC
40.0000 mg | DELAYED_RELEASE_TABLET | Freq: Every day | ORAL | Status: DC
  Administered 2012-06-20: 40 mg via ORAL
  Filled 2012-06-20: qty 1

## 2012-06-20 MED ORDER — MIDAZOLAM HCL 5 MG/5ML IJ SOLN
INTRAMUSCULAR | Status: DC | PRN
  Administered 2012-06-20 (×2): 1 mg via INTRAVENOUS

## 2012-06-20 MED ORDER — MEPERIDINE HCL 25 MG/ML IJ SOLN: 6.2500 mg | INTRAMUSCULAR | Status: DC | PRN

## 2012-06-20 MED ORDER — SUCCINYLCHOLINE CHLORIDE 20 MG/ML IJ SOLN
INTRAMUSCULAR | Status: DC | PRN
  Administered 2012-06-20: 120 mg via INTRAVENOUS

## 2012-06-20 MED ORDER — BIMATOPROST 0.01 % OP SOLN
1.0000 [drp] | Freq: Two times a day (BID) | OPHTHALMIC | Status: DC
  Administered 2012-06-20 – 2012-06-21 (×2): 1 [drp] via OPHTHALMIC
  Filled 2012-06-20: qty 2.5

## 2012-06-20 MED ORDER — ONDANSETRON HCL 4 MG/2ML IJ SOLN
INTRAMUSCULAR | Status: DC | PRN
  Administered 2012-06-20: 4 mg via INTRAVENOUS

## 2012-06-20 MED ORDER — VANCOMYCIN HCL IN DEXTROSE 1-5 GM/200ML-% IV SOLN
1000.0000 mg | Freq: Two times a day (BID) | INTRAVENOUS | Status: AC
  Administered 2012-06-21: 1000 mg via INTRAVENOUS
  Filled 2012-06-20: qty 200

## 2012-06-20 MED ORDER — SODIUM CHLORIDE 0.9 % IV SOLN
INTRAVENOUS | Status: DC
  Administered 2012-06-21 (×2): via INTRAVENOUS

## 2012-06-20 MED ORDER — LIDOCAINE HCL (CARDIAC) 20 MG/ML IV SOLN
INTRAVENOUS | Status: DC | PRN
  Administered 2012-06-20: 60 mg via INTRAVENOUS

## 2012-06-20 MED ORDER — GLYCOPYRROLATE 0.2 MG/ML IJ SOLN
INTRAMUSCULAR | Status: DC | PRN
  Administered 2012-06-20: 0.6 mg via INTRAVENOUS

## 2012-06-20 MED ORDER — METOCLOPRAMIDE HCL 5 MG/ML IJ SOLN: 5.0000 mg | Freq: Three times a day (TID) | INTRAMUSCULAR | Status: DC | PRN

## 2012-06-20 MED ORDER — ALBUTEROL SULFATE HFA 108 (90 BASE) MCG/ACT IN AERS
INHALATION_SPRAY | RESPIRATORY_TRACT | Status: DC | PRN
  Administered 2012-06-20: 4 via RESPIRATORY_TRACT
  Administered 2012-06-20: 6 via RESPIRATORY_TRACT

## 2012-06-20 MED ORDER — FLUOXETINE HCL 20 MG PO TABS
40.0000 mg | ORAL_TABLET | Freq: Every day | ORAL | Status: DC
  Filled 2012-06-20: qty 2

## 2012-06-20 MED ORDER — 0.9 % SODIUM CHLORIDE (POUR BTL) OPTIME
TOPICAL | Status: DC | PRN
  Administered 2012-06-20: 1000 mL

## 2012-06-20 MED ORDER — HYDROMORPHONE HCL PF 1 MG/ML IJ SOLN
0.5000 mg | INTRAMUSCULAR | Status: DC | PRN
  Administered 2012-06-20: 0.5 mg via INTRAVENOUS
  Administered 2012-06-20: 0.25 mg via INTRAVENOUS
  Administered 2012-06-20: 0.5 mg via INTRAVENOUS
  Filled 2012-06-20: qty 1

## 2012-06-20 MED ORDER — PREGABALIN 50 MG PO CAPS: 100.0000 mg | ORAL_CAPSULE | Freq: Every day | ORAL | Status: DC

## 2012-06-20 MED ORDER — PROPOFOL 10 MG/ML IV BOLUS
INTRAVENOUS | Status: DC | PRN
  Administered 2012-06-20: 150 mg via INTRAVENOUS

## 2012-06-20 MED ORDER — ONDANSETRON HCL 4 MG/2ML IJ SOLN: 4.0000 mg | Freq: Four times a day (QID) | INTRAMUSCULAR | Status: DC | PRN

## 2012-06-20 MED ORDER — LAMOTRIGINE 150 MG PO TABS
150.0000 mg | ORAL_TABLET | Freq: Every day | ORAL | Status: DC
  Filled 2012-06-20: qty 1

## 2012-06-20 MED ORDER — HYDROMORPHONE HCL PF 1 MG/ML IJ SOLN
1.0000 mg | INTRAMUSCULAR | Status: DC | PRN
  Administered 2012-06-20 (×2): 1 mg via INTRAVENOUS
  Filled 2012-06-20 (×3): qty 1

## 2012-06-20 MED ORDER — CLONAZEPAM 0.5 MG PO TABS: 0.5000 mg | ORAL_TABLET | Freq: Two times a day (BID) | ORAL | Status: DC

## 2012-06-20 MED ORDER — DOCUSATE SODIUM 100 MG PO CAPS: 100.0000 mg | ORAL_CAPSULE | Freq: Two times a day (BID) | ORAL | Status: DC

## 2012-06-20 MED ORDER — TRAZODONE HCL 100 MG PO TABS
100.0000 mg | ORAL_TABLET | Freq: Every day | ORAL | Status: DC
Start: 2012-06-20 — End: 2012-06-20
  Administered 2012-06-21: 100 mg via ORAL
  Filled 2012-06-20: qty 1

## 2012-06-20 MED ORDER — OXYCODONE HCL 5 MG/5ML PO SOLN: 5.0000 mg | Freq: Once | ORAL | Status: AC | PRN

## 2012-06-20 MED ORDER — METHOCARBAMOL 500 MG PO TABS
500.0000 mg | ORAL_TABLET | Freq: Four times a day (QID) | ORAL | Status: DC | PRN
  Administered 2012-06-20 – 2012-06-22 (×3): 500 mg via ORAL
  Filled 2012-06-20 (×2): qty 1

## 2012-06-20 MED ORDER — POTASSIUM CHLORIDE 20 MEQ/15ML (10%) PO LIQD
40.0000 meq | Freq: Every evening | ORAL | Status: DC
  Filled 2012-06-20: qty 30

## 2012-06-20 MED ORDER — ACETAMINOPHEN 10 MG/ML IV SOLN
1000.0000 mg | Freq: Once | INTRAVENOUS | Status: AC
  Administered 2012-06-20: 1000 mg via INTRAVENOUS
  Filled 2012-06-20: qty 100

## 2012-06-20 MED ORDER — LAMOTRIGINE 25 MG PO TABS: 75.0000 mg | ORAL_TABLET | Freq: Two times a day (BID) | ORAL | Status: DC

## 2012-06-20 MED ORDER — ARTIFICIAL TEARS OP OINT
TOPICAL_OINTMENT | OPHTHALMIC | Status: DC | PRN
  Administered 2012-06-20: 1 via OPHTHALMIC

## 2012-06-20 MED ORDER — FERROUS SULFATE 325 (65 FE) MG PO TABS
325.0000 mg | ORAL_TABLET | Freq: Three times a day (TID) | ORAL | Status: DC
  Administered 2012-06-21 – 2012-06-23 (×8): 325 mg via ORAL
  Filled 2012-06-20 (×10): qty 1

## 2012-06-20 MED ORDER — PREGABALIN 50 MG PO CAPS: 100.0000 mg | ORAL_CAPSULE | Freq: Two times a day (BID) | ORAL | Status: DC

## 2012-06-20 MED ORDER — FENTANYL CITRATE 0.05 MG/ML IJ SOLN
INTRAMUSCULAR | Status: DC | PRN
  Administered 2012-06-20: 50 ug via INTRAVENOUS
  Administered 2012-06-20 (×2): 25 ug via INTRAVENOUS
  Administered 2012-06-20: 100 ug via INTRAVENOUS

## 2012-06-20 MED ORDER — PREGABALIN 50 MG PO CAPS: 400.0000 mg | ORAL_CAPSULE | Freq: Every day | ORAL | Status: DC

## 2012-06-20 MED ORDER — NISOLDIPINE ER 17 MG PO TB24
17.0000 mg | ORAL_TABLET | Freq: Every day | ORAL | Status: DC
  Administered 2012-06-20: 17 mg via ORAL
  Filled 2012-06-20: qty 1

## 2012-06-20 MED ORDER — BUDESONIDE-FORMOTEROL FUMARATE 160-4.5 MCG/ACT IN AERO
2.0000 | INHALATION_SPRAY | Freq: Every day | RESPIRATORY_TRACT | Status: DC
  Administered 2012-06-20: 2 via RESPIRATORY_TRACT
  Filled 2012-06-20: qty 6

## 2012-06-20 MED ORDER — ONDANSETRON HCL 4 MG PO TABS: 4.0000 mg | ORAL_TABLET | Freq: Four times a day (QID) | ORAL | Status: DC | PRN

## 2012-06-20 MED ORDER — BUDESONIDE-FORMOTEROL FUMARATE 160-4.5 MCG/ACT IN AERO: 1.0000 | INHALATION_SPRAY | Freq: Two times a day (BID) | RESPIRATORY_TRACT | Status: DC

## 2012-06-20 MED ORDER — ATORVASTATIN CALCIUM 20 MG PO TABS
20.0000 mg | ORAL_TABLET | Freq: Every evening | ORAL | Status: DC
  Filled 2012-06-20: qty 1

## 2012-06-20 MED ORDER — VANCOMYCIN HCL 1000 MG IV SOLR
1000.0000 mg | Freq: Two times a day (BID) | INTRAVENOUS | Status: DC
  Administered 2012-06-20: 1000 mg via INTRAVENOUS
  Filled 2012-06-20: qty 1000

## 2012-06-20 MED ORDER — ENOXAPARIN SODIUM 40 MG/0.4ML ~~LOC~~ SOLN
40.0000 mg | SUBCUTANEOUS | Status: DC
  Administered 2012-06-21 – 2012-06-23 (×3): 40 mg via SUBCUTANEOUS
  Filled 2012-06-20 (×4): qty 0.4

## 2012-06-20 MED ORDER — BRINZOLAMIDE 1 % OP SUSP
1.0000 [drp] | Freq: Two times a day (BID) | OPHTHALMIC | Status: DC
  Administered 2012-06-21: 1 [drp] via OPHTHALMIC
  Filled 2012-06-20: qty 10

## 2012-06-20 MED ORDER — METHOCARBAMOL 100 MG/ML IJ SOLN
500.0000 mg | Freq: Four times a day (QID) | INTRAVENOUS | Status: DC | PRN
  Filled 2012-06-20: qty 5

## 2012-06-20 MED ORDER — HEPARIN SODIUM (PORCINE) 5000 UNIT/ML IJ SOLN
5000.0000 [IU] | Freq: Three times a day (TID) | INTRAMUSCULAR | Status: DC
  Administered 2012-06-21: 5000 [IU] via SUBCUTANEOUS
  Filled 2012-06-20 (×4): qty 1

## 2012-06-20 MED ORDER — EPHEDRINE SULFATE 50 MG/ML IJ SOLN
INTRAMUSCULAR | Status: DC | PRN
  Administered 2012-06-20 (×3): 10 mg via INTRAVENOUS

## 2012-06-20 MED ORDER — NEOSTIGMINE METHYLSULFATE 1 MG/ML IJ SOLN
INTRAMUSCULAR | Status: DC | PRN
  Administered 2012-06-20: 4 mg via INTRAVENOUS

## 2012-06-20 MED ORDER — CLONAZEPAM 0.5 MG PO TABS: 0.5000 mg | ORAL_TABLET | Freq: Two times a day (BID) | ORAL | Status: DC | PRN

## 2012-06-20 MED ORDER — ROCURONIUM BROMIDE 100 MG/10ML IV SOLN
INTRAVENOUS | Status: DC | PRN
  Administered 2012-06-20: 40 mg via INTRAVENOUS

## 2012-06-20 MED ORDER — MORPHINE SULFATE 2 MG/ML IJ SOLN
2.0000 mg | INTRAMUSCULAR | Status: DC | PRN
  Administered 2012-06-21 (×4): 2 mg via INTRAVENOUS
  Filled 2012-06-20 (×4): qty 1

## 2012-06-20 MED ORDER — LEVALBUTEROL TARTRATE 45 MCG/ACT IN AERO: 1.0000 | INHALATION_SPRAY | Freq: Three times a day (TID) | RESPIRATORY_TRACT | Status: DC | PRN

## 2012-06-20 MED ORDER — METOCLOPRAMIDE HCL 10 MG PO TABS: 5.0000 mg | ORAL_TABLET | Freq: Three times a day (TID) | ORAL | Status: DC | PRN

## 2012-06-20 MED ORDER — BUDESONIDE-FORMOTEROL FUMARATE 160-4.5 MCG/ACT IN AERO
1.0000 | INHALATION_SPRAY | Freq: Every day | RESPIRATORY_TRACT | Status: DC
  Administered 2012-06-20: 1 via RESPIRATORY_TRACT

## 2012-06-20 MED ORDER — LACTATED RINGERS IV SOLN
INTRAVENOUS | Status: DC | PRN
  Administered 2012-06-20: 17:00:00 via INTRAVENOUS

## 2012-06-20 MED ORDER — SODIUM CHLORIDE 0.9 % IV SOLN
250.0000 mL | INTRAVENOUS | Status: DC | PRN
  Administered 2012-06-20: 250 mL via INTRAVENOUS

## 2012-06-20 MED ORDER — OXYCODONE HCL 5 MG PO TABS
5.0000 mg | ORAL_TABLET | Freq: Once | ORAL | Status: AC | PRN
  Administered 2012-06-20: 5 mg via ORAL

## 2012-06-20 MED ORDER — HYDROMORPHONE HCL PF 1 MG/ML IJ SOLN
0.2500 mg | INTRAMUSCULAR | Status: DC | PRN
  Administered 2012-06-20 (×4): 0.25 mg via INTRAVENOUS

## 2012-06-20 MED ORDER — ONDANSETRON HCL 4 MG/2ML IJ SOLN: 4.0000 mg | Freq: Once | INTRAMUSCULAR | Status: DC | PRN

## 2012-06-20 MED ORDER — SODIUM CHLORIDE 0.9 % IJ SOLN: 3.0000 mL | Freq: Two times a day (BID) | INTRAMUSCULAR | Status: DC

## 2012-06-20 MED ORDER — LAMOTRIGINE 25 MG PO TABS
75.0000 mg | ORAL_TABLET | Freq: Every day | ORAL | Status: DC
  Administered 2012-06-21: 75 mg via ORAL
  Filled 2012-06-20: qty 3

## 2012-06-20 MED ORDER — OXYCODONE HCL 5 MG PO TABS
5.0000 mg | ORAL_TABLET | ORAL | Status: DC | PRN
  Administered 2012-06-21: 10 mg via ORAL
  Administered 2012-06-21: 5 mg via ORAL
  Administered 2012-06-21 – 2012-06-22 (×4): 10 mg via ORAL
  Filled 2012-06-20 (×6): qty 2

## 2012-06-20 MED ORDER — SODIUM CHLORIDE 0.9 % IJ SOLN: 3.0000 mL | INTRAMUSCULAR | Status: DC | PRN

## 2012-06-20 MED ORDER — VANCOMYCIN HCL IN DEXTROSE 1-5 GM/200ML-% IV SOLN
1000.0000 mg | Freq: Two times a day (BID) | INTRAVENOUS | Status: DC
  Filled 2012-06-20: qty 200

## 2012-06-20 MED ORDER — LORATADINE 10 MG PO TABS
10.0000 mg | ORAL_TABLET | Freq: Every day | ORAL | Status: DC
  Filled 2012-06-20: qty 1

## 2012-06-20 SURGICAL SUPPLY — 54 items
AFFIXUS HIP FRACTURE NAIL - LEFT 130 DEGREE 9MMX34 ×1 IMPLANT
BANDAGE ELASTIC 4 VELCRO ST LF (GAUZE/BANDAGES/DRESSINGS) ×1 IMPLANT
BANDAGE ELASTIC 6 VELCRO ST LF (GAUZE/BANDAGES/DRESSINGS) ×1 IMPLANT
BIT DRILL 4.3MMS DISTAL GRDTED (BIT) IMPLANT
CLOTH BEACON ORANGE TIMEOUT ST (SAFETY) ×2 IMPLANT
COVER SURGICAL LIGHT HANDLE (MISCELLANEOUS) ×2 IMPLANT
DRAPE C-ARM 42X72 X-RAY (DRAPES) ×1 IMPLANT
DRAPE INCISE IOBAN 66X45 STRL (DRAPES) IMPLANT
DRAPE ORTHO SPLIT 77X108 STRL (DRAPES)
DRAPE PROXIMA HALF (DRAPES) ×4 IMPLANT
DRAPE STERI IOBAN 125X83 (DRAPES) ×1 IMPLANT
DRAPE SURG ORHT 6 SPLT 77X108 (DRAPES) ×2 IMPLANT
DRAPE U-SHAPE 47X51 STRL (DRAPES) ×1 IMPLANT
DRILL 4.3MMS DISTAL GRADUATED (BIT) ×2
DRSG ADAPTIC 3X8 NADH LF (GAUZE/BANDAGES/DRESSINGS) ×1 IMPLANT
DRSG MEPILEX BORDER 4X4 (GAUZE/BANDAGES/DRESSINGS) ×3 IMPLANT
DRSG MEPILEX BORDER 4X8 (GAUZE/BANDAGES/DRESSINGS) ×2 IMPLANT
DRSG PAD ABDOMINAL 8X10 ST (GAUZE/BANDAGES/DRESSINGS) ×2 IMPLANT
DURAPREP 26ML APPLICATOR (WOUND CARE) ×2 IMPLANT
ELECT REM PT RETURN 9FT ADLT (ELECTROSURGICAL) ×2
ELECTRODE REM PT RTRN 9FT ADLT (ELECTROSURGICAL) ×1 IMPLANT
GLOVE BIOGEL PI IND STRL 7.5 (GLOVE) IMPLANT
GLOVE BIOGEL PI IND STRL 8.5 (GLOVE) IMPLANT
GLOVE BIOGEL PI INDICATOR 7.5 (GLOVE) ×1
GLOVE BIOGEL PI INDICATOR 8.5 (GLOVE) ×1
GLOVE BIOGEL PI ORTHO PRO 7.5 (GLOVE) ×1
GLOVE BIOGEL PI ORTHO PRO SZ8 (GLOVE) ×1
GLOVE ORTHO TXT STRL SZ7.5 (GLOVE) ×1 IMPLANT
GLOVE PI ORTHO PRO STRL 7.5 (GLOVE) ×1 IMPLANT
GLOVE PI ORTHO PRO STRL SZ8 (GLOVE) ×1 IMPLANT
GLOVE SURG ORTHO 8.5 STRL (GLOVE) ×1 IMPLANT
GOWN STRL NON-REIN LRG LVL3 (GOWN DISPOSABLE) ×4 IMPLANT
GOWN STRL REIN XL XLG (GOWN DISPOSABLE) ×4 IMPLANT
GUIDEWIRE BALL NOSE 80CM (WIRE) ×1 IMPLANT
HIP FRA NAIL LAG SCREW 10.5X90 (Orthopedic Implant) ×2 IMPLANT
KIT BASIN OR (CUSTOM PROCEDURE TRAY) ×2 IMPLANT
KIT ROOM TURNOVER OR (KITS) ×2 IMPLANT
MANIFOLD NEPTUNE II (INSTRUMENTS) ×1 IMPLANT
NS IRRIG 1000ML POUR BTL (IV SOLUTION) ×2 IMPLANT
PACK GENERAL/GYN (CUSTOM PROCEDURE TRAY) ×2 IMPLANT
PAD ARMBOARD 7.5X6 YLW CONV (MISCELLANEOUS) ×4 IMPLANT
PAD CAST 4YDX4 CTTN HI CHSV (CAST SUPPLIES) ×1 IMPLANT
PADDING CAST COTTON 4X4 STRL (CAST SUPPLIES)
SCREW BONE CORTICAL 5.0X36 (Screw) IMPLANT
SCREW BONE CORTICAL 5.0X38 (Screw) ×1 IMPLANT
SCREW BONE CORTICAL 5.0X40 (Screw) ×1 IMPLANT
SCREW LAG HIP FRA NAIL 10.5X90 (Orthopedic Implant) IMPLANT
SPONGE GAUZE 4X4 12PLY (GAUZE/BANDAGES/DRESSINGS) ×1 IMPLANT
STAPLER VISISTAT 35W (STAPLE) ×1 IMPLANT
SUT ETHILON 4 0 PS 2 18 (SUTURE) IMPLANT
SUT VIC AB 0 CTB1 27 (SUTURE) ×1 IMPLANT
SUT VIC AB 2-0 CT1 27 (SUTURE) ×4
SUT VIC AB 2-0 CT1 TAPERPNT 27 (SUTURE) IMPLANT
WATER STERILE IRR 1000ML POUR (IV SOLUTION) ×2 IMPLANT

## 2012-06-20 NOTE — H&P (Signed)
[facility] History and Physical  [name] [name] ZOX:[id number] DOB: 02-22-1946    PCP:   Pcp Not In System   Chief Complaint: fell and suffered left proximal femur fracture.  HPI: [name] [name] is an 67 y.o. [name] with hx of syncope, admitted 12/14, felt to be noncardiac in etiology, hx of asthma, depression, anxiety, HTN, PVD, s/p spinal surgery, presents to [facility] after a mechanical fall and was found to have a left proximal femur fracture.  She requested transfer to [facility] for surgery.  Dr Shon Baton was consulted and will see her in the am.  Further work up included a CXR which question aspiration vs contusion, a normal WBC, Hb 12.9g/DL, normal renal fx tests.  Her EKG showed SR with no acute ST-T changes.  She has mild rhabdo with CPK of 279 and negative troponin.  Hospitalist was asked to admit her for left hip fx.  Rewiew of Systems:  Constitutional: Negative for malaise, fever and chills. No significant weight loss or weight gain Eyes: Negative for eye pain, redness and discharge, diplopia, visual changes, or flashes of light. ENMT: Negative for ear pain, hoarseness, nasal congestion, sinus pressure and sore throat. No headaches; tinnitus, drooling, or problem swallowing. Cardiovascular: Negative for chest pain, palpitations, diaphoresis, dyspnea and peripheral edema. ; No orthopnea, PND Respiratory: Negative for cough, hemoptysis, wheezing and stridor. No pleuritic chestpain. Gastrointestinal: Negative for nausea, vomiting, diarrhea, constipation, abdominal pain, melena, blood in stool, hematemesis, jaundice and rectal bleeding.    Genitourinary: Negative for frequency, dysuria, incontinence,flank pain and hematuria; Musculoskeletal: Negative for back pain and neck pain. Negative for swelling and trauma.; She has left hip pain. Skin: . Negative for pruritus, rash, abrasions, bruising and skin lesion.; ulcerations Neuro: Negative for headache, lightheadedness and neck  stiffness. Negative for weakness, altered level of consciousness , altered mental status, extremity weakness, burning feet, involuntary movement, seizure and syncope.  Psych: negative for  insomnia, tearfulness, panic attacks, hallucinations, paranoia, suicidal or homicidal ideation    Past Medical History  Diagnosis Date  . GERD (gastroesophageal reflux disease)   . Peripheral vascular disease   . Cataracts, bilateral   . Glaucoma(365)   . Asthma   . Depression   . Hypertension   . Arthritis   . Anxiety     Past Surgical History  Procedure Laterality Date  . Back surgery    . Bladder suspension      x3  . Knee arhtroscopy    . Hand / finger lesion excision      Medications:  HOME MEDS: Prior to Admission medications   Medication Sig Start Date End Date Taking? Authorizing Provider  atorvastatin (LIPITOR) 20 MG tablet Take 20 mg by mouth every evening.     Historical Provider, MD  baclofen (LIORESAL) 10 MG tablet Take 10 mg by mouth at bedtime.      Historical Provider, MD  bimatoprost (LUMIGAN) 0.01 % SOLN Place 1 drop into both eyes 2 (two) times daily.     Historical Provider, MD  brinzolamide (AZOPT) 1 % ophthalmic suspension Place 1 drop into both eyes 2 (two) times daily.      Historical Provider, MD  budesonide-formoterol (SYMBICORT) 160-4.5 MCG/ACT inhaler Inhale 1-2 puffs into the lungs 2 (two) times daily. 2 puffs every morning and 1 puff every evening    Historical Provider, MD  bumetanide (BUMEX) 2 MG tablet Take 4 mg by mouth daily as needed. For swelling    Historical Provider, MD  cetirizine (ZYRTEC)  10 MG tablet Take 10 mg by mouth daily.    Historical Provider, MD  clindamycin (CLEOCIN) 300 MG capsule Take 1 capsule (300 mg total) by mouth 3 (three) times daily. 03/08/12   Rhonda G Barrett, PA-C  clonazePAM (KLONOPIN) 0.5 MG tablet Take 0.5 mg by mouth 2 (two) times daily.    Historical Provider, MD  dexlansoprazole (DEXILANT) 60 MG capsule Take 60 mg by mouth  every evening.     Historical Provider, MD  FLUoxetine (PROZAC) 20 MG tablet Take 40 mg by mouth every morning. PT TAKES 2 CAPS FOR 40MG  DOSAGE    Historical Provider, MD  fluticasone (FLONASE) 50 MCG/ACT nasal spray Place 2 sprays into the nose 2 (two) times daily as needed.     Historical Provider, MD  lamoTRIgine (LAMICTAL) 150 MG tablet Take 75-150 mg by mouth 2 (two) times daily. Take 150 mg every morning and 75 mg every evening    Historical Provider, MD  levalbuterol (XOPENEX HFA) 45 MCG/ACT inhaler Inhale 1-2 puffs into the lungs every 4 (four) hours as needed. For shortness of breath    Historical Provider, MD  methocarbamol (ROBAXIN) 500 MG tablet Take 500 mg by mouth every 8 (eight) hours as needed. SPASMS    Historical Provider, MD  nisoldipine (SULAR) 17 MG 24 hr tablet Take 17 mg by mouth every morning.      Historical Provider, MD  potassium chloride 20 MEQ/15ML (10%) SOLN Take 40 mEq by mouth every evening. Mixed in water. Takes after evening meal so it won't burn stomach    Historical Provider, MD  pregabalin (LYRICA) 100 MG capsule Take 100-400 mg by mouth 2 (two) times daily. 100 mg every morning and 400 mg every evening    Historical Provider, MD  traZODone (DESYREL) 100 MG tablet Take 100 mg by mouth at bedtime.    Historical Provider, MD     Allergies:  Allergies  Allergen Reactions  . Latex Hives  . Peanuts (Peanut Oil) Anaphylaxis and Swelling  . Sulfa Antibiotics Shortness Of Breath  . Neosporin (Neomycin-Polymyxin-Gramicidin) Rash  . Penicillins Rash    Social History:   reports that she has never smoked. She does not have any smokeless tobacco history on file. She reports that she drinks about 0.6 ounces of alcohol per week. She reports that she does not use illicit drugs.  Family History: History reviewed. No pertinent family history.   Physical Exam: Filed Vitals:   06/20/12 0230  BP: 127/53  Pulse: 73  Temp: 99 F (37.2 C)  TempSrc: Oral  Resp: 17   SpO2: 98%   Blood pressure 127/53, pulse 73, temperature 99 F (37.2 C), temperature source Oral, resp. rate 17, SpO2 98.00%.  GEN:  Pleasant patient lying in the stretcher in no acute distress; cooperative with exam. PSYCH:  alert and oriented x4; does not appear anxious or depressed; affect is appropriate. HEENT: Mucous membranes pink and anicteric; PERRLA; EOM intact; no cervical lymphadenopathy nor thyromegaly or carotid bruit; no JVD; There were no stridor. Neck is very supple. Breasts:: Not examined CHEST WALL: No tenderness CHEST: Normal respiration, clear to auscultation bilaterally.  HEART: Regular rate and rhythm.  There are no murmur, rub, or gallops.   BACK: No kyphosis or scoliosis; no CVA tenderness ABDOMEN: soft and non-tender; no masses, no organomegaly, normal abdominal bowel sounds; no pannus; no intertriginous candida. There is no rebound and no distention. Rectal Exam: Not done EXTREMITIES: No bone or joint deformity; age-appropriate arthropathy of the hands  and knees; no edema; no ulcerations.  There is no calf tenderness. Her left leg is shorter and is external rotated. Genitalia: not examined PULSES: 2+ and symmetric SKIN: Normal hydration no rash or ulceration CNS: Cranial nerves 2-12 grossly intact no focal lateralizing neurologic deficit.  Speech is fluent; uvula elevated with phonation, facial symmetry and tongue midline. DTR are normal bilaterally, cerebella exam is intact, barbinski is negative and strengths are equaled bilaterally.  No sensory loss.   Labs on Admission:  Basic Metabolic Panel: No results found for this basename: NA, K, CL, CO2, GLUCOSE, BUN, CREATININE, CALCIUM, MG, PHOS,  in the last 168 hours Liver Function Tests: No results found for this basename: AST, ALT, ALKPHOS, BILITOT, PROT, ALBUMIN,  in the last 168 hours No results found for this basename: LIPASE, AMYLASE,  in the last 168 hours No results found for this basename: AMMONIA,  in  the last 168 hours CBC: No results found for this basename: WBC, NEUTROABS, HGB, HCT, MCV, PLT,  in the last 168 hours Cardiac Enzymes: No results found for this basename: CKTOTAL, CKMB, CKMBINDEX, TROPONINI,  in the last 168 hours  CBG: No results found for this basename: GLUCAP,  in the last 168 hours   Radiological Exams on Admission: No results found.  EKG: Independently reviewed. SR with no ischemic changes.   Assessment/Plan Present on Admission:  . Depression . Asthma, chronic Left hip fracture\ HTN   PLAN:  Will admit her for left hip fracture.  I have given her subcutaneous heparin for DVT prophylaxis.  Will continue her meds.  Accepting a slight increase cardiovascular risk during the perioperative period, she is clear for surgery.  I will have her NPO, except for her meds.  She is stable, full code, and will be admitted to [facility] service. Dr Shon Baton is aware of her admission and will see her in consultation in the am.  Thank you for allowing me to partake in the care of your patient.  Other plans as per orders.  Code Status: FULL Unk Lightning, MD. [facility] Pager [phone] 7pm to 7am.  06/20/2012, 3:31 AM

## 2012-06-20 NOTE — Anesthesia Procedure Notes (Signed)
Procedure Name: Intubation Date/Time: 06/20/2012 6:02 PM Performed by: Ellin Goodie Pre-anesthesia Checklist: Patient identified, Emergency Drugs available, Suction available, Patient being monitored and Timeout performed Patient Re-evaluated:Patient Re-evaluated prior to inductionOxygen Delivery Method: Circle system utilized Preoxygenation: Pre-oxygenation with 100% oxygen Intubation Type: IV induction Ventilation: Mask ventilation without difficulty Laryngoscope Size: Mac and 4 Grade View: Grade I Tube type: Oral Tube size: 7.5 mm Number of attempts: 1 Airway Equipment and Method: Stylet Placement Confirmation: ETT inserted through vocal cords under direct vision,  positive ETCO2 and breath sounds checked- equal and bilateral Secured at: 22 cm Tube secured with: Tape Dental Injury: Teeth and Oropharynx as per pre-operative assessment

## 2012-06-20 NOTE — Progress Notes (Signed)
Orthopedics Progress Note  Subjective: 67 yo female s/p ground level fall with resulting proximal femoral shaft fracture.  Patient is known to me.  No other injuries identified at this time.  Objective:  Filed Vitals:   06/20/12 1507  BP: 112/56  Pulse: 71  Temp:   Resp: 16    General: Awake and alert  Musculoskeletal: Left LE shortened and rotated, good distal pulses. Neurovascularly intact  Lab Results  Component Value Date   WBC 8.2 06/20/2012   HGB 11.7* 06/20/2012   HCT 35.6* 06/20/2012   MCV 87.7 06/20/2012   PLT 144* 06/20/2012       Component Value Date/Time   NA 141 06/20/2012 0943   K 4.4 06/20/2012 0943   CL 103 06/20/2012 0943   CO2 29 06/20/2012 0943   GLUCOSE 108* 06/20/2012 0943   BUN 22 06/20/2012 0943   CREATININE 1.10 06/20/2012 0943   CALCIUM 8.8 06/20/2012 0943   GFRNONAA 50* 06/20/2012 0943   GFRAA 58* 06/20/2012 0943    Lab Results  Component Value Date   INR 1.02 03/06/2012   INR 1.35 09/15/2011   INR 1.12 01/30/2010    Assessment/Plan: 67 female with numerous medical problems and fall causing a displaced femur fracture on the left.  Patient cleared medically for surgery.  Plan antegrade IM nail. Will need SNF post op. Patient agrees with the plan.  Almedia Balls. Ranell Patrick, MD 06/20/2012 5:00 PM

## 2012-06-20 NOTE — Progress Notes (Signed)
Patient seen and examined. No complaints. She denies cough, chest pain. Feeling better after pain medications. She is sleepy, wake up to answer some question. IV dilaudid dose decreases. Will hold some psych medications to avoid oversedation. Will change klonopin to PRN to avoid oversedation. There was mention of lung contusion vs PNA on outside chest x ray. Will order chest x ray, this will need to be follow up. Patient denies cough. Will order B-met.  Laith Antonelli, Md.

## 2012-06-20 NOTE — Clinical Social Work Psychosocial (Signed)
Clinical Social Work Department  BRIEF PSYCHOSOCIAL ASSESSMENT  Patient: [name]  Account Number: [id number]   Admit date: 06/20/2012 Clinical Social Worker Sabino Niemann, MSW Date/Time: 06/20/2012 12:00 Referred by: Physician Date Referred: 06/20/2012 Referred for   SNF Placement   Other Referral:  Interview type: Patient  Other interview type: PSYCHOSOCIAL DATA  Living Status: lives alone Admitted from facility:  Level of care:  Primary support name: Keenan Bachelor Primary support relationship to patient: Brother Degree of support available:  Strong and vested  CURRENT CONCERNS  Current Concerns   Post-Acute Placement   Other Concerns:  SOCIAL WORK ASSESSMENT / PLAN  CSW met with pt re: PT recommendation for SNF.   Pt lives alone and is unable to care for herself at this time  CSW explained placement process and answered questions.   Pt reports [facility]  as her preference    CSW completed FL2 and initiated SNF search.     Assessment/plan status: Information/Referral to [facility]  Other assessment/ plan:  Information/referral to community resources:  SNF     PATIENT'S/FAMILY'S RESPONSE TO PLAN OF CARE:  Pt  reports she is agreeable to ST SNF in order to increase strength and independence with mobility prior to returning home  Pt verbalized understanding of placement process and appreciation for CSW assist.  Patient is going to surgery today.  Sabino Niemann, MSW [phone]

## 2012-06-20 NOTE — Anesthesia Preprocedure Evaluation (Addendum)
Anesthesia Evaluation  Patient identified by MRN, date of birth, ID band Patient awake    Reviewed: Allergy & Precautions, H&P , NPO status , Patient's Chart, lab work & pertinent test results, reviewed documented beta blocker date and time   Airway Mallampati: II TM Distance: >3 FB Neck ROM: Full    Dental  (+) Teeth Intact, Dental Advisory Given, Edentulous Upper and Edentulous Lower   Pulmonary shortness of breath and with exertion, asthma ,  breath sounds clear to auscultation        Cardiovascular hypertension, Pt. on medications + Peripheral Vascular Disease Rhythm:Regular     Neuro/Psych Anxiety Depression    GI/Hepatic GERD-  Medicated and Controlled,  Endo/Other    Renal/GU Renal disease     Musculoskeletal   Abdominal (+)  Abdomen: soft. Bowel sounds: normal.  Peds  Hematology   Anesthesia Other Findings   Reproductive/Obstetrics                         Anesthesia Physical Anesthesia Plan  ASA: III  Anesthesia Plan: General   Post-op Pain Management:    Induction: Intravenous  Airway Management Planned: Oral ETT  Additional Equipment:   Intra-op Plan:   Post-operative Plan: Extubation in OR  Informed Consent: I have reviewed the patients History and Physical, chart, labs and discussed the procedure including the risks, benefits and alternatives for the proposed anesthesia with the patient or authorized representative who has indicated his/her understanding and acceptance.     Plan Discussed with: CRNA and Surgeon  Anesthesia Plan Comments:         Anesthesia Quick Evaluation

## 2012-06-20 NOTE — Anesthesia Postprocedure Evaluation (Signed)
Anesthesia Post Note  Patient: [name]  Procedure(s) Performed: Procedure(s) (LRB): INTRAMEDULLARY (IM) NAIL FEMORAL (Left)  Anesthesia type: general  Patient location: PACU  Post pain: Pain level controlled  Post assessment: Patient's Cardiovascular Status Stable  Last Vitals:  Filed Vitals:   06/20/12 2113  BP: 144/96  Pulse: 80  Temp: 36.6 C  Resp: 18    Post vital signs: Reviewed and stable  Level of consciousness: sedated  Complications: No apparent anesthesia complications

## 2012-06-20 NOTE — Transfer of Care (Signed)
Immediate Anesthesia Transfer of Care Note  Patient: [name]  Procedure(s) Performed: Procedure(s): INTRAMEDULLARY (IM) NAIL FEMORAL (Left)  Patient Location: PACU  Anesthesia Type:General  Level of Consciousness: awake and patient cooperative  Airway & Oxygen Therapy: Patient Spontanous Breathing and Patient connected to face mask oxygen  Post-op Assessment: Report given to PACU RN, Post -op Vital signs reviewed and stable and Patient moving all extremities X 4  Post vital signs: Reviewed and stable  Complications: No apparent anesthesia complications

## 2012-06-20 NOTE — Consult Note (Signed)
Pcp Not In System Chief Complaint: Left prox femur fx.  History: [name] is an 67 y.o. female with hx of syncope, admitted 12/14, felt to be noncardiac in etiology, hx of asthma, depression, anxiety, HTN, PVD, s/p spinal surgery, presents to [facility] after a mechanical fall and was found to have a left proximal femur fracture. She requested transfer to [facility] for surgery. Dr Shon Baton was consulted and will see her in the am. Further work up included a CXR which question aspiration vs contusion, a normal WBC, Hb 12.9g/DL, normal renal fx tests. Her EKG showed SR with no acute ST-T changes. She has mild rhabdo with CPK of 279 and negative troponin.   Past Medical History  Diagnosis Date  . GERD (gastroesophageal reflux disease)   . Peripheral vascular disease   . Cataracts, bilateral   . Glaucoma(365)   . Asthma   . Depression   . Hypertension   . Arthritis   . Anxiety     Allergies  Allergen Reactions  . Latex Hives  . Peanuts (Peanut Oil) Anaphylaxis and Swelling  . Sulfa Antibiotics Shortness Of Breath  . Neosporin (Neomycin-Polymyxin-Gramicidin) Rash  . Penicillins Rash    No current facility-administered medications on file prior to encounter.   Current Outpatient Prescriptions on File Prior to Encounter  Medication Sig Dispense Refill  . atorvastatin (LIPITOR) 20 MG tablet Take 20 mg by mouth every evening.       . baclofen (LIORESAL) 10 MG tablet Take 10 mg by mouth at bedtime.        . bimatoprost (LUMIGAN) 0.01 % SOLN Place 1 drop into both eyes 2 (two) times daily.       . brinzolamide (AZOPT) 1 % ophthalmic suspension Place 1 drop into both eyes 2 (two) times daily.        . budesonide-formoterol (SYMBICORT) 160-4.5 MCG/ACT inhaler Inhale 1-2 puffs into the lungs 2 (two) times daily. 2 puffs every morning and 1 puff every evening      . bumetanide (BUMEX) 2 MG tablet Take 4 mg by mouth daily as needed. For swelling      . cetirizine (ZYRTEC) 10 MG tablet Take  10 mg by mouth daily.      . clindamycin (CLEOCIN) 300 MG capsule Take 1 capsule (300 mg total) by mouth 3 (three) times daily.  21 capsule  0  . clonazePAM (KLONOPIN) 0.5 MG tablet Take 0.5 mg by mouth 2 (two) times daily.      Marland Kitchen dexlansoprazole (DEXILANT) 60 MG capsule Take 60 mg by mouth every evening.       Marland Kitchen FLUoxetine (PROZAC) 20 MG tablet Take 40 mg by mouth every morning. PT TAKES 2 CAPS FOR 40MG  DOSAGE      . fluticasone (FLONASE) 50 MCG/ACT nasal spray Place 2 sprays into the nose 2 (two) times daily as needed.       . lamoTRIgine (LAMICTAL) 150 MG tablet Take 75-150 mg by mouth 2 (two) times daily. Take 150 mg every morning and 75 mg every evening      . levalbuterol (XOPENEX HFA) 45 MCG/ACT inhaler Inhale 1-2 puffs into the lungs every 4 (four) hours as needed. For shortness of breath      . methocarbamol (ROBAXIN) 500 MG tablet Take 500 mg by mouth every 8 (eight) hours as needed. SPASMS      . nisoldipine (SULAR) 17 MG 24 hr tablet Take 17 mg by mouth every morning.        Marland Kitchen  potassium chloride 20 MEQ/15ML (10%) SOLN Take 40 mEq by mouth every evening. Mixed in water. Takes after evening meal so it won't burn stomach      . pregabalin (LYRICA) 100 MG capsule Take 100-400 mg by mouth 2 (two) times daily. 100 mg every morning and 400 mg every evening      . traZODone (DESYREL) 100 MG tablet Take 100 mg by mouth at bedtime.        Physical Exam: Filed Vitals:   06/20/12 0610  BP: 137/62  Pulse: 69  Temp: 97.5 F (36.4 C)  Resp: 17  A+O X3 NVI: 1 +1  DP/PT pulses Compartments soft/NT No knee/ankle swelling, deformity, or pain Leg shortened flexed ext rotated position. No laceration/abrasion No expanding hematoma Abd soft/NT No sob/cp   Image: xrays from outside hospital: proximal oblique left femur fracture.  Positive comminution and displacement  A/P:  Patient s/p fall at home with inability to ambulate.  Patient seen at [facility] and requested transfer to  [facility] for definitive management.  Patient known to my partner Dr Ranell Patrick.   1. Patient with multiple medical issues (COPD, HTN) and so was admitted to medical service 2. Case discussed with Dr Ranell Patrick - will plan on definitive treatment later today

## 2012-06-20 NOTE — Clinical Social Work Placement (Addendum)
Clinical Social Work Department  CLINICAL SOCIAL WORK PLACEMENT NOTE  06/20/2012 Patient: [name]  Account Number:  [id number] Admit date: 06/19/12 Clinical Social Worker: Sabino Niemann MSW Date/time: 06/20/2012 11:30 AM  Clinical Social Work is seeking post-discharge placement for this patient at the following level of care: SKILLED NURSING (*CSW will update this form in Epic as items are completed)  06/20/2012 Patient/family provided with [facility] Department of Clinical Social Work's list of facilities offering this level of care within the geographic area requested by the patient (or if unable, by the patient's family).  06/20/2012 Patient/family informed of their freedom to choose among providers that offer the needed level of care, that participate in Medicare, Medicaid or managed care program needed by the patient, have an available bed and are willing to accept the patient.  06/20/2012 Patient/family informed of [facility]' ownership interest in [facility], as well as of the fact that they are under no obligation to receive care at this facility.  PASARR submitted to EDS on 06/20/2012 - PASARR number received from EDS on 06/22/2012  FL2 transmitted to all facilities in geographic area requested by pt/family on 06/20/2012 FL2 transmitted to all facilities within larger geographic area on  Patient informed that his/her managed care company has contracts with or will negotiate with certain facilities, including the following:  Patient/family informed of bed offers received:  Patient chooses bed at [facility] Physician recommends and patient chooses bed at [facility]  Patient to be transferred to on 06/23/12 (JB) Patient to be transferred to facility by Darnell Level) The following physician request were entered in Epic:  Additional Comments:

## 2012-06-20 NOTE — Brief Op Note (Signed)
06/20/2012  7:58 PM  PATIENT:  [name]  67 y.o. female  PRE-OPERATIVE DIAGNOSIS:  Left femur fracture  POST-OPERATIVE DIAGNOSIS:  Left femur fracture  PROCEDURE:  Procedure(s): INTRAMEDULLARY (IM) NAIL FEMORAL (Left) Antegrade trochanteric entry, Biomet  SURGEON:  Surgeon(s) and Role:    * Verlee Rossetti, MD - Primary  PHYSICIAN ASSISTANT:   ASSISTANTS: Thea Gist, PA-C   ANESTHESIA:   general  EBL:  Total I/O In: -  Out: 200 [Blood:200]  BLOOD ADMINISTERED:none  DRAINS: none   LOCAL MEDICATIONS USED:  NONE  SPECIMEN:  No Specimen  DISPOSITION OF SPECIMEN:  N/A  COUNTS:  YES  TOURNIQUET:  * No tourniquets in log *  DICTATION: .Other Dictation: Dictation Number [id number]  PLAN OF CARE: Admit to inpatient   PATIENT DISPOSITION:  PACU - hemodynamically stable.   Delay start of Pharmacological VTE agent (>24hrs) due to surgical blood loss or risk of bleeding: no

## 2012-06-21 ENCOUNTER — Encounter (HOSPITAL_COMMUNITY): Payer: Self-pay | Admitting: General Practice

## 2012-06-21 LAB — BASIC METABOLIC PANEL
GFR calc Af Amer: 80 mL/min — ABNORMAL LOW (ref >90)
GFR calc non Af Amer: 69 mL/min — ABNORMAL LOW (ref >90)
Potassium: 4.1 mEq/L (ref 3.5–5.1)
Sodium: 138 mEq/L (ref 135–145)

## 2012-06-21 LAB — CBC
Hemoglobin: 9.3 g/dL — ABNORMAL LOW (ref 12.0–15.0)
MCHC: 33.3 g/dL (ref 30.0–36.0)
RBC: 3.19 MIL/uL — ABNORMAL LOW (ref 3.87–5.11)
WBC: 6.1 10*3/uL (ref 4.0–10.5)

## 2012-06-21 MED ORDER — CLONAZEPAM 0.5 MG PO TABS
0.5000 mg | ORAL_TABLET | Freq: Two times a day (BID) | ORAL | Status: DC
  Administered 2012-06-21 – 2012-06-22 (×3): 0.5 mg via ORAL
  Filled 2012-06-21 (×3): qty 1

## 2012-06-21 MED ORDER — BUDESONIDE-FORMOTEROL FUMARATE 160-4.5 MCG/ACT IN AERO
2.0000 | INHALATION_SPRAY | Freq: Every day | RESPIRATORY_TRACT | Status: DC
  Administered 2012-06-22 – 2012-06-23 (×2): 2 via RESPIRATORY_TRACT
  Filled 2012-06-21: qty 6

## 2012-06-21 MED ORDER — BIMATOPROST 0.01 % OP SOLN
1.0000 [drp] | Freq: Two times a day (BID) | OPHTHALMIC | Status: DC
  Administered 2012-06-21 – 2012-06-23 (×4): 1 [drp] via OPHTHALMIC
  Filled 2012-06-21: qty 2.5

## 2012-06-21 MED ORDER — LAMOTRIGINE 25 MG PO TABS
75.0000 mg | ORAL_TABLET | Freq: Every day | ORAL | Status: DC
  Administered 2012-06-21: 75 mg via ORAL
  Filled 2012-06-21 (×2): qty 3

## 2012-06-21 MED ORDER — CLONAZEPAM 0.5 MG PO TABS
0.5000 mg | ORAL_TABLET | Freq: Two times a day (BID) | ORAL | Status: DC | PRN
  Administered 2012-06-21: 0.5 mg via ORAL
  Filled 2012-06-21: qty 1

## 2012-06-21 MED ORDER — LEVALBUTEROL TARTRATE 45 MCG/ACT IN AERO
1.0000 | INHALATION_SPRAY | Freq: Three times a day (TID) | RESPIRATORY_TRACT | Status: DC | PRN
  Administered 2012-06-22: 2 via RESPIRATORY_TRACT
  Filled 2012-06-21: qty 15

## 2012-06-21 MED ORDER — FLUOXETINE HCL 20 MG PO TABS
40.0000 mg | ORAL_TABLET | Freq: Every day | ORAL | Status: DC
  Administered 2012-06-21 – 2012-06-23 (×3): 40 mg via ORAL
  Filled 2012-06-21 (×3): qty 2

## 2012-06-21 MED ORDER — LEVOFLOXACIN IN D5W 750 MG/150ML IV SOLN
750.0000 mg | INTRAVENOUS | Status: DC
  Administered 2012-06-21 – 2012-06-23 (×3): 750 mg via INTRAVENOUS
  Filled 2012-06-21 (×4): qty 150

## 2012-06-21 MED ORDER — BUDESONIDE-FORMOTEROL FUMARATE 160-4.5 MCG/ACT IN AERO
1.0000 | INHALATION_SPRAY | RESPIRATORY_TRACT | Status: DC
  Administered 2012-06-21 – 2012-06-22 (×2): 1 via RESPIRATORY_TRACT

## 2012-06-21 MED ORDER — TRAZODONE HCL 100 MG PO TABS
100.0000 mg | ORAL_TABLET | Freq: Every day | ORAL | Status: DC
  Administered 2012-06-21: 100 mg via ORAL
  Filled 2012-06-21 (×4): qty 1

## 2012-06-21 MED ORDER — PREGABALIN 50 MG PO CAPS
400.0000 mg | ORAL_CAPSULE | Freq: Every day | ORAL | Status: DC
  Administered 2012-06-21: 400 mg via ORAL
  Filled 2012-06-21: qty 8

## 2012-06-21 MED ORDER — LAMOTRIGINE 25 MG PO TABS
150.0000 mg | ORAL_TABLET | Freq: Every day | ORAL | Status: DC
  Filled 2012-06-21: qty 6

## 2012-06-21 MED ORDER — LAMOTRIGINE 150 MG PO TABS
150.0000 mg | ORAL_TABLET | Freq: Every day | ORAL | Status: DC
  Administered 2012-06-21 – 2012-06-22 (×2): 150 mg via ORAL
  Filled 2012-06-21 (×4): qty 1

## 2012-06-21 MED ORDER — LEVALBUTEROL TARTRATE 45 MCG/ACT IN AERO
2.0000 | INHALATION_SPRAY | Freq: Once | RESPIRATORY_TRACT | Status: AC
  Administered 2012-06-21: 2 via RESPIRATORY_TRACT
  Filled 2012-06-21: qty 15

## 2012-06-21 MED ORDER — PREGABALIN 50 MG PO CAPS
100.0000 mg | ORAL_CAPSULE | Freq: Every day | ORAL | Status: DC
  Administered 2012-06-21 – 2012-06-23 (×3): 100 mg via ORAL
  Filled 2012-06-21 (×3): qty 2

## 2012-06-21 NOTE — Evaluation (Signed)
Physical Therapy Evaluation Patient Details Name: [name] MRN: [id number] DOB: [birth year] Today's Date: 06/21/2012 Time: 5784-6962 PT Time Calculation (min): 29 min  PT Assessment / Plan / Recommendation Clinical Impression  Pt. sustained a fall resulting in left hip fx. now sp/ IM nail and  PNA . Very slow progress with mobility 1st time up and will need SNF lever rehab after DC from acute, Needs acute PT to initiate therapy to ease burden of care at next venue.    PT Assessment  Patient needs continued PT services    Follow Up Recommendations  SNF;Supervision/Assistance - 24 hour    Does the patient have the potential to tolerate intense rehabilitation      Barriers to Discharge Decreased caregiver support      Equipment Recommendations  None recommended by PT    Recommendations for Other Services     Frequency Min 5X/week    Precautions / Restrictions Precautions Precautions: Fall Restrictions Weight Bearing Restrictions: Yes LLE Weight Bearing: Partial weight bearing LLE Partial Weight Bearing Percentage or Pounds: 50   Pertinent Vitals/Pain See vital tab      Mobility  Bed Mobility Bed Mobility: Supine to Sit;Sitting - Scoot to Edge of Bed;Sit to Supine Supine to Sit: 1: +2 Total assist Supine to Sit: Patient Percentage: 40% Sitting - Scoot to Edge of Bed: 2: Max assist Sit to Supine: Not Tested (comment) Details for Bed Mobility Assistance: heavy physical assist and max encouragement needed; pt. tends to yell in pain Transfers Transfers: Sit to Stand;Stand to Sit;Stand Pivot Transfers Sit to Stand: 1: +2 Total assist;From bed;With upper extremity assist Sit to Stand: Patient Percentage: 40% Stand to Sit: 1: +2 Total assist;To chair/3-in-1;With armrests Stand to Sit: Patient Percentage: 40% Stand Pivot Transfers: 1: +2 Total assist Stand Pivot Transfers: Patient Percentage: 40% Details for Transfer Assistance: heavy physical and max verbal  cueing Ambulation/Gait Ambulation/Gait Assistance: Not tested (comment) Wheelchair Mobility Wheelchair Mobility: No    Exercises General Exercises - Lower Extremity Ankle Circles/Pumps: AROM;Both;10 reps Quad Sets: AROM;Left;5 reps   PT Diagnosis: Difficulty walking;Acute pain;Abnormality of gait  PT Problem List: Decreased strength;Decreased activity tolerance;Decreased mobility;Decreased knowledge of use of DME;Decreased safety awareness;Decreased knowledge of precautions;Pain;Cardiopulmonary status limiting activity PT Treatment Interventions: DME instruction;Gait training;Functional mobility training;Therapeutic activities;Therapeutic exercise;Patient/family education   PT Goals Acute Rehab PT Goals PT Goal Formulation: With patient Time For Goal Achievement: 06/28/12 Potential to Achieve Goals: Fair Pt will go Supine/Side to Sit: with min assist PT Goal: Supine/Side to Sit - Progress: Goal set today Pt will go Sit to Supine/Side: with min assist PT Goal: Sit to Supine/Side - Progress: Goal set today Pt will go Sit to Stand: with min assist PT Goal: Sit to Stand - Progress: Goal set today Pt will go Stand to Sit: with min assist PT Goal: Stand to Sit - Progress: Goal set today Pt will Transfer Bed to Chair/Chair to Bed: with min assist PT Transfer Goal: Bed to Chair/Chair to Bed - Progress: Goal set today Pt will Ambulate: 1 - 15 feet;with min assist;with rolling walker PT Goal: Ambulate - Progress: Goal set today Pt will Perform Home Exercise Program: with min assist PT Goal: Perform Home Exercise Program - Progress: Goal set today  Visit Information  Last PT Received On: 06/21/12 Assistance Needed: +2    Subjective Data  Subjective: My friend is the closest thing I have to family Patient Stated Goal: light housework, carry on with life   Prior Functioning  Home  Living Lives With: Alone Available Help at Discharge: Family;Available PRN/intermittently Type of Home:  House Home Access: Stairs to enter [employer] of Steps: 2 Entrance Stairs-Rails: None Home Layout: Two level;Laundry or work area in basement [employer] Shower/Tub: Forensic scientist: Pharmacist, community: No Home Adaptive Equipment: Straight cane;Quad cane;Walker - rolling Prior Function Level of Independence: Independent Able to Take Stairs?: Yes Driving: Yes Vocation: Retired Musician: No difficulties Dominant Hand: Right    Cognition  Cognition Overall Cognitive Status: Appears within functional limits for tasks assessed/performed Arousal/Alertness: Awake/alert Orientation Level: Oriented X4 / Intact Behavior During Session: Anxious    Extremity/Trunk Assessment Right Upper Extremity Assessment RUE ROM/Strength/Tone: WFL for tasks assessed Left Upper Extremity Assessment LUE ROM/Strength/Tone: WFL for tasks assessed Right Lower Extremity Assessment RLE ROM/Strength/Tone: WFL for tasks assessed RLE Sensation: WFL - Light Touch Left Lower Extremity Assessment LLE ROM/Strength/Tone: Deficits;Unable to fully assess;Due to pain LLE ROM/Strength/Tone Deficits: good ankle pump, fair quad set LLE Sensation: WFL - Light Touch Trunk Assessment Trunk Assessment: Normal   Balance    End of Session PT - End of Session Equipment Utilized During Treatment: Gait belt Activity Tolerance: Patient limited by pain;Other (comment) (limited by anxiousness) Patient left: in chair;with call bell/phone within reach;with nursing in room Nurse Communication: Mobility status;Patient requests pain meds;Precautions;Weight bearing status  GP     Ferman Hamming 06/21/2012, 4:23 PM Weldon Picking PT Acute Rehab Services [phone] Beeper [phone]

## 2012-06-21 NOTE — Progress Notes (Signed)
[facility] PROGRESS NOTE  [name] WJX:[id number] DOB: [birth year] DOA: 06/20/2012 PCP: Pcp Not In System  Assessment/Plan: 1-Left Femur Fracture: S/P Intramedullary nail femoral antegrade trochanteric entry, Biomet 4-3. PT per ortho. DVT prophylaxis: Lovenox.  2-PNA. Community. Chest x ray with infiltrate vs atelectasis. Patient relates cough started prior to admission. I will start Levaquin.  3-Depresion, anxiety: resume home medications. Patient more awake today.  4-Anemia, acute blood loss, expected post surgery. Monitor Hb.  5-Asthma: Stable.    Code Status: Full Family Communication: Care discussed with patient. Disposition Plan: SNF   Consultants:  Dr Ranell Patrick  Procedures:  Intramedullary nail femoral antegrade trochanteric entry, Biomet 4-3  Antibiotics:  Levaquin 4-3  HPI/Subjective: Feeling well, minimal pain. Awake, following command.   Objective: Filed Vitals:   06/20/12 2113 06/20/12 2125 06/21/12 0226 06/21/12 0614  BP: 144/96  113/54 130/58  Pulse: 80  68 68  Temp: 97.8 F (36.6 C)  98.7 F (37.1 C) 98.9 F (37.2 C)  TempSrc: Oral  Oral Oral  Resp: 18  18 18   Height: 5' (1.524 m)     Weight: 102.513 kg (226 lb)     SpO2: 98% 92% 94% 94%    Intake/Output Summary (Last 24 hours) at 06/21/12 0904 Last data filed at 06/21/12 0840  Gross per 24 hour  Intake 1933.33 ml  Output    650 ml  Net 1283.33 ml   Filed Weights   06/20/12 2113  Weight: 102.513 kg (226 lb)    Exam:   General:  No distress, awake.   Cardiovascular: S 1, S 2 RRR  Respiratory: CTA  Abdomen: bs present, soft, nt  Musculoskeletal: no edema.   Data Reviewed: Basic Metabolic Panel:  Recent Labs Lab 06/20/12 0650 06/20/12 0943 06/21/12 0500  NA  --  141 138  K  --  4.4 4.1  CL  --  103 103  CO2  --  29 30  GLUCOSE  --  108* 123*  BUN  --  22 21  CREATININE 0.91 1.10 0.85  CALCIUM  --  8.8 8.6   Liver Function Tests: No results found for this  basename: AST, ALT, ALKPHOS, BILITOT, PROT, ALBUMIN,  in the last 168 hours No results found for this basename: LIPASE, AMYLASE,  in the last 168 hours No results found for this basename: AMMONIA,  in the last 168 hours CBC:  Recent Labs Lab 06/20/12 0650 06/21/12 0500  WBC 8.2 6.1  HGB 11.7* 9.3*  HCT 35.6* 27.9*  MCV 87.7 87.5  PLT 144* 88*   Cardiac Enzymes: No results found for this basename: CKTOTAL, CKMB, CKMBINDEX, TROPONINI,  in the last 168 hours BNP (last 3 results)  Recent Labs  09/20/11 0315 03/06/12 1842 03/06/12 2322  PROBNP 28054.0* 291.1* 260.5*   CBG: No results found for this basename: GLUCAP,  in the last 168 hours  Recent Results (from the past 240 hour(s))  SURGICAL PCR SCREEN     Status: None   Collection Time    06/20/12  8:48 AM      Result Value Range Status   MRSA, PCR NEGATIVE  NEGATIVE Final   Staphylococcus aureus NEGATIVE  NEGATIVE Final   Comment:            The Xpert SA Assay (FDA     approved for NASAL specimens     in patients over 69 years of age),     is one component of     a comprehensive  surveillance     program.  Test performance has     been validated by [facility] for patients greater     than or equal to 54 year old.     It is not intended     to diagnose infection nor to     guide or monitor treatment.     Studies: Dg Chest 2 View  06/20/2012  *RADIOLOGY REPORT*  Clinical Data: Evaluate contusion versus pneumonia  CHEST - 2 VIEW  Comparison: 06/19/2012  Findings: Right upper lobe airspace disease shows mild improvement. This may be an area of pneumonia.  Remaining lungs are clear.  Cardiac enlargement without heart failure.  Thoracic and lumbar fusion.  IMPRESSION: Clearing right upper lobe airspace disease may represent atelectasis or pneumonia.  Cardiac enlargement without heart failure.   Original Report Authenticated By: Janeece Riggers, M.D.    Dg Femur Left  06/20/2012  *RADIOLOGY REPORT*  Clinical Data: Femur  fracture  LEFT FEMUR - 2 VIEW  Comparison: 800 hours  Findings: Images demonstrate fixation of the femur fracture with a dynamic compression screw and intramedullary rod.  Two distal interlocking screws are in place.  Anatomic alignment.  IMPRESSION: ORIF femur fracture.   Original Report Authenticated By: Jolaine Click, M.D.    Dg Femur Left Port  06/20/2012  *RADIOLOGY REPORT*  Clinical Data: Left femoral fracture  PORTABLE LEFT FEMUR - 2 VIEW  Comparison:  06/19/2012  Findings: Two views of the left femur submitted.  Again noted displaced oblique fracture proximal shaft of the left femur.  IMPRESSION: Again noted displaced oblique fracture proximal shaft of left femur.   Original Report Authenticated By: Natasha Mead, M.D.    Dg C-arm 61-120 Min-no Report  06/20/2012  CLINICAL DATA: Left femoral nail   C-ARM 61-120 MINUTES  Fluoroscopy was utilized by the requesting physician.  No radiographic  interpretation.      Scheduled Meds: . clonazePAM  0.5 mg Oral BID  . enoxaparin (LOVENOX) injection  40 mg Subcutaneous Q24H  . ferrous sulfate  325 mg Oral TID PC  . FLUoxetine  40 mg Oral Daily  . lamoTRIgine  75-150 mg Oral BID  . levofloxacin (LEVAQUIN) IV  750 mg Intravenous Q24H  . pregabalin  100-400 mg Oral BID  . traZODone  100 mg Oral QHS   Continuous Infusions: . sodium chloride 50 mL/hr at 06/21/12 0208    Principal Problem:   Femur fracture, left Active Problems:   S/P spinal surgery   Depression   Asthma, chronic    Time spent: 25 minutes.    Fidel Caggiano  [facility] Pager [phone]. If 7PM-7AM, please contact night-coverage at [email or web address], password [facility] 06/21/2012, 9:04 AM  LOS: 1 day

## 2012-06-21 NOTE — Progress Notes (Signed)
   Subjective: 1 Day Post-Op Procedure(s) (LRB): INTRAMEDULLARY (IM) NAIL FEMORAL (Left)  Pt c/o moderate pain but it is tolerable Denies any numbness to tingling distally  Patient reports pain as moderate.  Objective:   VITALS:   Filed Vitals:   06/21/12 0614  BP: 130/58  Pulse: 68  Temp: 98.9 F (37.2 C)  Resp: 18   Left leg incision healing well nv intact distally No rashes or drainage  LABS  Recent Labs  06/20/12 0650 06/21/12 0500  HGB 11.7* 9.3*  HCT 35.6* 27.9*  WBC 8.2 6.1  PLT 144* 88*     Recent Labs  06/20/12 0650 06/20/12 0943 06/21/12 0500  NA  --  141 138  K  --  4.4 4.1  BUN  --  22 21  CREATININE 0.91 1.10 0.85  GLUCOSE  --  108* 123*     Assessment/Plan: 1 Day Post-Op Procedure(s) (LRB): INTRAMEDULLARY (IM) NAIL FEMORAL (Left)   PT/OT Pain management as needed D/c planning to SNF dvt prophylaxis   Alphonsa Overall, MPAS, PA-C  06/21/2012, 8:18 AM

## 2012-06-21 NOTE — Progress Notes (Signed)
Occupational Therapy  Order received, reviewed chart, pt planning to discharge to SNF. Will defer OT to SNF.  No acute OT needs identified.  Will sign off.   Jeani Hawking, OTR/L [phone]

## 2012-06-22 DIAGNOSIS — D62 Acute posthemorrhagic anemia: Secondary | ICD-10-CM

## 2012-06-22 DIAGNOSIS — D696 Thrombocytopenia, unspecified: Secondary | ICD-10-CM

## 2012-06-22 LAB — PREPARE RBC (CROSSMATCH)

## 2012-06-22 LAB — BASIC METABOLIC PANEL WITH GFR
BUN: 12 mg/dL (ref 6–23)
CO2: 30 meq/L (ref 19–32)
Calcium: 8.2 mg/dL — ABNORMAL LOW (ref 8.4–10.5)
Chloride: 98 meq/L (ref 96–112)
Creatinine, Ser: 0.78 mg/dL (ref 0.50–1.10)
GFR calc Af Amer: >90 mL/min (ref >90)
GFR calc non Af Amer: 84 mL/min — ABNORMAL LOW (ref >90)
Glucose, Bld: 113 mg/dL — ABNORMAL HIGH (ref 70–99)
Potassium: 3.9 meq/L (ref 3.5–5.1)
Sodium: 134 meq/L — ABNORMAL LOW (ref 135–145)

## 2012-06-22 LAB — CBC
HCT: 25.1 % — ABNORMAL LOW (ref 36.0–46.0)
MCV: 88.1 fL (ref 78.0–100.0)
RDW: 13.7 % (ref 11.5–15.5)
WBC: 5.1 10*3/uL (ref 4.0–10.5)

## 2012-06-22 MED ORDER — LIDOCAINE HCL (PF) 1 % IJ SOLN
5.0000 mL | Freq: Once | INTRAMUSCULAR | Status: AC
  Administered 2012-06-22: 5 mL
  Filled 2012-06-22: qty 5

## 2012-06-22 MED ORDER — LIDOCAINE HCL 1 % IJ SOLN
5.0000 mL | Freq: Once | INTRAMUSCULAR | Status: DC
  Filled 2012-06-22: qty 5

## 2012-06-22 MED ORDER — DIPHENHYDRAMINE HCL 25 MG PO CAPS
25.0000 mg | ORAL_CAPSULE | Freq: Once | ORAL | Status: AC
  Administered 2012-06-22: 25 mg via ORAL
  Filled 2012-06-22: qty 1

## 2012-06-22 MED ORDER — BISACODYL 10 MG RE SUPP
10.0000 mg | Freq: Every day | RECTAL | Status: DC | PRN
  Filled 2012-06-22: qty 1

## 2012-06-22 MED ORDER — BUPIVACAINE HCL 0.5 % IJ SOLN
5.0000 mL | Freq: Once | INTRAMUSCULAR | Status: DC
  Filled 2012-06-22: qty 5

## 2012-06-22 MED ORDER — MORPHINE SULFATE 2 MG/ML IJ SOLN: 1.0000 mg | INTRAMUSCULAR | Status: DC | PRN

## 2012-06-22 MED ORDER — OXYCODONE HCL 5 MG PO TABS
5.0000 mg | ORAL_TABLET | ORAL | Status: DC | PRN
  Administered 2012-06-22 – 2012-06-23 (×3): 5 mg via ORAL
  Filled 2012-06-22 (×3): qty 1

## 2012-06-22 MED ORDER — FUROSEMIDE 10 MG/ML IJ SOLN
20.0000 mg | Freq: Once | INTRAMUSCULAR | Status: AC
  Administered 2012-06-22: 20 mg via INTRAVENOUS
  Filled 2012-06-22: qty 2

## 2012-06-22 MED ORDER — TRIAMCINOLONE ACETONIDE 40 MG/ML IJ SUSP
40.0000 mg | Freq: Once | INTRAMUSCULAR | Status: AC
  Administered 2012-06-22: 40 mg via INTRA_ARTICULAR
  Filled 2012-06-22: qty 1

## 2012-06-22 MED ORDER — POLYETHYLENE GLYCOL 3350 17 G PO PACK
17.0000 g | PACK | Freq: Two times a day (BID) | ORAL | Status: DC
  Administered 2012-06-22 – 2012-06-23 (×3): 17 g via ORAL
  Filled 2012-06-22 (×5): qty 1

## 2012-06-22 MED ORDER — LORATADINE 10 MG PO TABS
10.0000 mg | ORAL_TABLET | Freq: Every day | ORAL | Status: DC
  Administered 2012-06-22 – 2012-06-23 (×2): 10 mg via ORAL
  Filled 2012-06-22 (×2): qty 1

## 2012-06-22 MED ORDER — BUPIVACAINE HCL (PF) 0.5 % IJ SOLN
5.0000 mL | Freq: Once | INTRAMUSCULAR | Status: AC
  Administered 2012-06-22: 5 mL via INTRA_ARTICULAR
  Filled 2012-06-22: qty 10

## 2012-06-22 MED ORDER — CLONAZEPAM 0.5 MG PO TABS
0.5000 mg | ORAL_TABLET | Freq: Two times a day (BID) | ORAL | Status: DC | PRN
  Administered 2012-06-22: 0.5 mg via ORAL
  Filled 2012-06-22: qty 1

## 2012-06-22 MED ORDER — DOCUSATE SODIUM 100 MG PO CAPS
100.0000 mg | ORAL_CAPSULE | Freq: Two times a day (BID) | ORAL | Status: DC
  Administered 2012-06-22 – 2012-06-23 (×3): 100 mg via ORAL
  Filled 2012-06-22 (×4): qty 1

## 2012-06-22 MED ORDER — ACETAMINOPHEN 325 MG PO TABS
650.0000 mg | ORAL_TABLET | Freq: Once | ORAL | Status: AC
  Administered 2012-06-22: 650 mg via ORAL
  Filled 2012-06-22: qty 2

## 2012-06-22 NOTE — Progress Notes (Signed)
Physical Therapy Treatment Patient Details Name: [name] MRN: [id number] DOB: 01-Feb-1946 Today's Date: 06/22/2012 Time: 9563-8756 PT Time Calculation (min): 38 min  PT Assessment / Plan / Recommendation Comments on Treatment Session  Pt. has poor awareness of her current functional status.  She required more physical assist today and it will benefit pt. and staff to use lift equipment until she can progress further with PT.    Follow Up Recommendations  SNF;Supervision/Assistance - 24 hour     Does the patient have the potential to tolerate intense rehabilitation     Barriers to Discharge        Equipment Recommendations  None recommended by PT    Recommendations for Other Services    Frequency Min 5X/week   Plan Discharge plan remains appropriate    Precautions / Restrictions Precautions Precautions: Fall Restrictions Weight Bearing Restrictions: Yes LLE Weight Bearing: Partial weight bearing LLE Partial Weight Bearing Percentage or Pounds: 50   Pertinent Vitals/Pain See vitals tab    Mobility  Bed Mobility Bed Mobility: Supine to Sit;Sitting - Scoot to Edge of Bed Supine to Sit: 1: +2 Total assist Supine to Sit: Patient Percentage: 30% Sitting - Scoot to Edge of Bed: 2: Max assist Sit to Supine: Not Tested (comment) Details for Bed Mobility Assistance: pt. able to initiate right hip boost today but not effective for moving to edge of bed Transfers Transfers: Sit to Stand;Stand to Sit;Stand Pivot Transfers Sit to Stand: 1: +2 Total assist;From bed;With upper extremity assist Sit to Stand: Patient Percentage: 30% Stand to Sit: 1: +2 Total assist;To chair/3-in-1;With armrests Stand to Sit: Patient Percentage: 30% Stand Pivot Transfers: 1: +2 Total assist;Other (comment) (third person for guiding hips to recliner) Stand Pivot Transfers: Patient Percentage: 20% Details for Transfer Assistance: third person needed to guide hips to recliner.  She is unable to  unweight right LE to pivot.   Ambulation/Gait Ambulation/Gait Assistance: Not tested (comment) Wheelchair Mobility Wheelchair Mobility: No    Exercises General Exercises - Lower Extremity Ankle Circles/Pumps: AROM;Both;15 reps;Supine Quad Sets: AROM;Both;10 reps;Supine Heel Slides: AAROM;Left;5 reps;Supine Hip ABduction/ADduction: AAROM;Left;5 reps;Supine   PT Diagnosis:    PT Problem List:   PT Treatment Interventions:     PT Goals Acute Rehab PT Goals Pt will go Supine/Side to Sit: with min assist PT Goal: Supine/Side to Sit - Progress: Progressing toward goal Pt will go Sit to Stand: with min assist PT Goal: Sit to Stand - Progress: Progressing toward goal Pt will go Stand to Sit: with min assist PT Goal: Stand to Sit - Progress: Progressing toward goal Pt will Transfer Bed to Chair/Chair to Bed: with min assist PT Transfer Goal: Bed to Chair/Chair to Bed - Progress: Not progressing Pt will Perform Home Exercise Program: with min assist PT Goal: Perform Home Exercise Program - Progress: Progressing toward goal  Visit Information  Last PT Received On: 06/22/12 Assistance Needed: +3 or more (third person to direct hips to recliner)    Subjective Data  Subjective: Pt. shouts with pain during mobility   Cognition  Cognition Overall Cognitive Status: Appears within functional limits for tasks assessed/performed Arousal/Alertness: Awake/alert Orientation Level: Oriented X4 / Intact Behavior During Session: Anxious    Balance     End of Session PT - End of Session Equipment Utilized During Treatment: Gait belt Activity Tolerance: Patient limited by pain Patient left: in chair;with call bell/phone within reach Nurse Communication: Mobility status;Need for lift equipment;Patient requests pain meds;Weight bearing status   GP  Ferman Hamming 06/22/2012, 11:11 AM Weldon Picking PT Acute Rehab Services [phone] Beeper [phone]

## 2012-06-22 NOTE — Op Note (Deleted)
NAME:  [name], [name]                ACCOUNT NO.:  [id number]  MEDICAL RECORD NO.:  [id number]  LOCATION:                                 FACILITY:  PHYSICIAN:  Steven R. Marielis Samara, M.D. DATE OF BIRTH:  09/04/1945  DATE OF PROCEDURE:  06/20/2012 DATE OF DISCHARGE:                              OPERATIVE REPORT   PREOPERATIVE DIAGNOSIS:  Left displaced proximal femoral shaft fracture.  POSTOPERATIVE DIAGNOSIS:  Left displaced proximal femoral shaft fracture.  PROCEDURE PERFORMED:  Closed intramedullary nailing of left displaced femur fracture.  SURGEON:  Steven R. Ariez Neilan, M.D.  ASSISTANT:  Thomas B. Dixon, PA-C, who scrubbed during the entire procedure and necessary for satisfactory completion of surgery.  ANESTHESIA:  General anesthesia was used.  ESTIMATED BLOOD LOSS:  About 250 mL.  FLUID REPLACEMENT:  1500 mL of crystalloid.  INSTRUMENT COUNTS:  Correct.  COMPLICATIONS:  There were no complications.  ANTIBIOTICS:  Perioperative antibiotics were given.  INDICATIONS:  The patient is a 66-year-old female, presents for a displaced left femur fracture that occurred late last night or yesterday.  The patient had a ground level fall, suffering a proximal femur fracture.  This was a spiral fracture in the lesser trochanteric/proximal third femur region.  The patient presented with a swollen thigh and able to bear weight.  She was transferred from [facility] to [facility] for definitive care.  She has been admitted by the medical service and cleared medically prior to surgery. Informed consent with the patient was obtained indicating the need for stabilization of the femur.  DESCRIPTION OF PROCEDURE:  After an adequate level of anesthesia was achieved, the patient was positioned on fracture table.  Perineal post utilized, left leg placed in traction boot.  Right leg placed modified lithotomy position.  Upper extremities were padded appropriately.  Pulse was checked  post positioning.  We brought the C-arm in, pulled traction on the leg and obtained appropriate alignment.  We thoroughly prepped and draped the left thigh and leg and then used a sterile shower curtain to drape the patient.  Time-out was called.  We then entered the left hip proximal to the greater trochanter.  C-arm was utilized multiplanar throughout the procedure.  Dissection down through the subcutaneous fat and down to the tensor fascia lata which was divided.  I was able to palpate the trochanter with a curved Mayo scissor.  We then used an awl to get a starting point in the proximal femur.  X-ray obtained, AP and lateral demonstrate appropriate position.  We then introduced a ball-tip guidewire down into the femur.  We then pulled out the awl, we then introduced the reduction tool off the Biomet set down into the proximal fragment.  We were able to then align the proximal fragment with the distal fragment introduced across the fracture site and once the ball- tipped guidewire was down to the suprapatellar pole of patella we measured the femoral length to 34 cm.  We then over reamed the proximal femur with the step-cut drill.  We next went ahead and did sequential reaming up to a size 11 diameter to accommodate a size 9 nail.  We then placed a 34   cm x 9 mm trochanteric entry nail, under direct C-arm visualization.  Once that was across the fracture site.  Fracture was appropriately aligned.  We then placed our lag screw up into the center of the femoral head on AP and lateral views.  Once that was done, it was 90 mm screw.  Then, we took off our traction.  We did our set screw for rotation and fixed that as this was not a intertrochanteric fracture. We wanted this to be a fixed angle.  We then went ahead and abducted the leg, and then found the center and did freehand distal interlocks with a 4.5 interlocks for this Biomet nail.  Once that was done, we thoroughly irrigated all  wounds and closed in layers with Vicryl and staples.  A sterile compressive bandage was applied.  The patient tolerated the procedure well.     Steven R. Jamichael Knotts, M.D.     SRN/MEDQ  D:  06/20/2012  T:  06/21/2012  Job:  [id number] 

## 2012-06-22 NOTE — Progress Notes (Signed)
In and out cath was done at 0700.

## 2012-06-22 NOTE — Progress Notes (Signed)
Foley was d/c yesterday at 1520, around 2230 pt voided 100cc. After this, pt had requested to viod and was unable to, but pt said that she was fine without any discomfort. She tried again and still couldn't void. Bladder scan was done with results of 381cc. In and out cath was done with an output of 750cc. Will cont to monitor. Report given to oncoming RN.

## 2012-06-22 NOTE — Progress Notes (Signed)
Orthopedics Progress Note  Subjective: My left knee hurts  Objective:  Filed Vitals:   06/22/12 0532  BP: 117/47  Pulse: 70  Temp: 98.4 F (36.9 C)  Resp: 15    General: Awake and alert  Musculoskeletal: Left thigh moderately swollen. Incisions CDI. Left knee is swollen and tender. Neurovascularly intact  Lab Results  Component Value Date   WBC 5.1 06/22/2012   HGB 8.4* 06/22/2012   HCT 25.1* 06/22/2012   MCV 88.1 06/22/2012   PLT 73* 06/22/2012       Component Value Date/Time   NA 134* 06/22/2012 0438   K 3.9 06/22/2012 0438   CL 98 06/22/2012 0438   CO2 30 06/22/2012 0438   GLUCOSE 113* 06/22/2012 0438   BUN 12 06/22/2012 0438   CREATININE 0.78 06/22/2012 0438   CALCIUM 8.2* 06/22/2012 0438   GFRNONAA 84* 06/22/2012 0438   GFRAA >90 06/22/2012 0438    Lab Results  Component Value Date   INR 1.02 03/06/2012   INR 1.35 09/15/2011   INR 1.12 01/30/2010    Assessment/Plan: POD #2 s/p Procedure(s): INTRAMEDULLARY (IM) NAIL FEMORAL Acute blood loss anemia. Will transfuse Plan injection with corticosteroid to assist her in mobilization  [place] R. Ranell Patrick, MD 06/22/2012 7:42 AM

## 2012-06-22 NOTE — Op Note (Signed)
NAMEJOSSELYN, [name] NO.:  [id number]  MEDICAL RECORD NO.:  [id number]  LOCATION:                                 FACILITY:  PHYSICIAN:  Almedia Balls. Ranell Patrick, M.D. DATE OF BIRTH:  Jul 28, 1945  DATE OF PROCEDURE:  06/20/2012 DATE OF DISCHARGE:                              OPERATIVE REPORT   PREOPERATIVE DIAGNOSIS:  Left displaced proximal femoral shaft fracture.  POSTOPERATIVE DIAGNOSIS:  Left displaced proximal femoral shaft fracture.  PROCEDURE PERFORMED:  Closed intramedullary nailing of left displaced femur fracture.  SURGEON:  Almedia Balls. Ranell Patrick, M.D.  ASSISTANT:  Donnie Coffin. Dixon, PA-C, who scrubbed during the entire procedure and necessary for satisfactory completion of surgery.  ANESTHESIA:  General anesthesia was used.  ESTIMATED BLOOD LOSS:  About 250 mL.  FLUID REPLACEMENT:  1500 mL of crystalloid.  INSTRUMENT COUNTS:  Correct.  COMPLICATIONS:  There were no complications.  ANTIBIOTICS:  Perioperative antibiotics were given.  INDICATIONS:  The patient is a 67 year old female, presents for a displaced left femur fracture that occurred late last night or yesterday.  The patient had a ground level fall, suffering a proximal femur fracture.  This was a spiral fracture in the lesser trochanteric/proximal third femur region.  The patient presented with a swollen thigh and able to bear weight.  She was transferred from [facility] to [facility] for definitive care.  She has been admitted by the medical service and cleared medically prior to surgery. Informed consent with the patient was obtained indicating the need for stabilization of the femur.  DESCRIPTION OF PROCEDURE:  After an adequate level of anesthesia was achieved, the patient was positioned on fracture table.  Perineal post utilized, left leg placed in traction boot.  Right leg placed modified lithotomy position.  Upper extremities were padded appropriately.  Pulse was checked  post positioning.  We brought the C-arm in, pulled traction on the leg and obtained appropriate alignment.  We thoroughly prepped and draped the left thigh and leg and then used a sterile shower curtain to drape the patient.  Time-out was called.  We then entered the left hip proximal to the greater trochanter.  C-arm was utilized multiplanar throughout the procedure.  Dissection down through the subcutaneous fat and down to the tensor fascia lata which was divided.  I was able to palpate the trochanter with a curved Mayo scissor.  We then used an awl to get a starting point in the proximal femur.  X-ray obtained, AP and lateral demonstrate appropriate position.  We then introduced a ball-tip guidewire down into the femur.  We then pulled out the awl, we then introduced the reduction tool off the Biomet set down into the proximal fragment.  We were able to then align the proximal fragment with the distal fragment introduced across the fracture site and once the ball- tipped guidewire was down to the suprapatellar pole of patella we measured the femoral length to 34 cm.  We then over reamed the proximal femur with the step-cut drill.  We next went ahead and did sequential reaming up to a size 11 diameter to accommodate a size 9 nail.  We then placed a 34  cm x 9 mm trochanteric entry nail, under direct C-arm visualization.  Once that was across the fracture site.  Fracture was appropriately aligned.  We then placed our lag screw up into the center of the femoral head on AP and lateral views.  Once that was done, it was 90 mm screw.  Then, we took off our traction.  We did our set screw for rotation and fixed that as this was not a intertrochanteric fracture. We wanted this to be a fixed angle.  We then went ahead and abducted the leg, and then found the center and did freehand distal interlocks with a 4.5 interlocks for this Biomet nail.  Once that was done, we thoroughly irrigated all  wounds and closed in layers with Vicryl and staples.  A sterile compressive bandage was applied.  The patient tolerated the procedure well.     Almedia Balls. Ranell Patrick, M.D.     SRN/MEDQ  D:  06/20/2012  T:  06/21/2012  Job:  [id number]

## 2012-06-22 NOTE — Progress Notes (Signed)
Pt unable to void since pt I/O cathed at 0700. Scanned bladder and scan showed 0cc of urine. Notified Dr Sunnie Nielsen. New order to repeat I/O cath. 300cc urine obtained. Will continue to monitor pts output.

## 2012-06-22 NOTE — Discharge Summary (Signed)
Physician Discharge Summary  [name] ZOX:[id number] DOB: 14-Mar-1946 DOA: 06/20/2012  PCP: Feliciana Rossetti, MD  Admit date: 06/20/2012 Discharge date: 06/22/2012  Time spent: 30 minutes  Recommendations for Outpatient Follow-up:  1. Need cbc to monitor platelet and hb level.  2. Needs to follow up with Dr Arthur Holms.   Discharge Diagnoses:  Principal Problem:   Femur fracture, left Active Problems:   S/P spinal surgery   Depression   Asthma, chronic   Discharge Condition: stable.   Diet recommendation: Heart Healthy  Filed Weights   06/20/12 2113  Weight: 102.513 kg (226 lb)    History of present illness:  [name] is an 67 y.o. female with hx of syncope, admitted 12/14, felt to be noncardiac in etiology, hx of asthma, depression, anxiety, HTN, PVD, s/p spinal surgery, presents to [facility] after a mechanical fall and was found to have a left proximal femur fracture. She requested transfer to [facility] for surgery. Dr Shon Baton was consulted and will see her in the am. Further work up included a CXR which question aspiration vs contusion, a normal WBC, Hb 12.9g/DL, normal renal fx tests. Her EKG showed SR with no acute ST-T changes. She has mild rhabdo with CPK of 279 and negative troponin. Hospitalist was asked to admit her for left hip fx.   Hospital Course:  1-Left Femur Fracture: S/P Intramedullary nail femoral antegrade trochanteric entry, Biomet 4-3.  PT per ortho. DVT prophylaxis: Lovenox per ortho. Monitor platelet level.  2-PNA. Community. Chest x ray with infiltrate vs atelectasis. Patient relates cough started prior to admission. Continue with Levaquin day 2. 3-Depresion, anxiety: Continue with  home medications.  4-Anemia, acute blood loss, expected post surgery. Hb decrease to 8. She will received 2 units PRBC 4-4.  5-Asthma: Stable.  6-Urine retention: post surgery : resolved.  7-Thrombocytopenia: probably increase consumption post bleed, surgery. Monitor on  Lovenox.    Will plan for Discharge 4-5 if ok with Dr Arthur Holms and platelet stable.   Procedures: Intramedullary nail femoral antegrade trochanteric entry, Biomet 4-3   Consultations:  Dr Ranell Patrick  Discharge Exam: Filed Vitals:   06/21/12 2033 06/21/12 2205 06/22/12 0532 06/22/12 1138  BP:  122/44 117/47   Pulse: 85 83 70   Temp:  100.3 F (37.9 C) 98.4 F (36.9 C)   TempSrc:  Oral Oral   Resp: 16 16 15    Height:      Weight:      SpO2: 96% 96% 100% 93%    General: No distress.  Cardiovascular: S 1, S 2 RRR Respiratory: CTA Abdomen: soft, NT Left Extremity with clean dressing.   Discharge Instructions     Medication List    ASK your doctor about these medications       atorvastatin 20 MG tablet  Commonly known as:  LIPITOR  Take 20 mg by mouth every evening.     baclofen 10 MG tablet  Commonly known as:  LIORESAL  Take 10 mg by mouth at bedtime.     bimatoprost 0.01 % Soln  Commonly known as:  LUMIGAN  Place 1 drop into both eyes 2 (two) times daily.     brinzolamide 1 % ophthalmic suspension  Commonly known as:  AZOPT  Place 1 drop into both eyes 2 (two) times daily.     budesonide-formoterol 160-4.5 MCG/ACT inhaler  Commonly known as:  SYMBICORT  Inhale 1-2 puffs into the lungs 2 (two) times daily. 2 puffs every morning and 1 puff every evening  bumetanide 2 MG tablet  Commonly known as:  BUMEX  Take 4 mg by mouth daily as needed. For swelling     cetirizine 10 MG tablet  Commonly known as:  ZYRTEC  Take 10 mg by mouth daily.     clonazePAM 0.5 MG tablet  Commonly known as:  KLONOPIN  Take 0.5 mg by mouth 2 (two) times daily.     DEXILANT 60 MG capsule  Generic drug:  dexlansoprazole  Take 60 mg by mouth every evening.     FLUoxetine 20 MG tablet  Commonly known as:  PROZAC  Take 40 mg by mouth every morning. PT TAKES 2 CAPS FOR 40MG  DOSAGE     fluticasone 50 MCG/ACT nasal spray  Commonly known as:  FLONASE  Place 2 sprays into the  nose 2 (two) times daily as needed.     lamoTRIgine 150 MG tablet  Commonly known as:  LAMICTAL  Take 75-150 mg by mouth 2 (two) times daily. Take 150 mg every morning and 75 mg every evening     levalbuterol 45 MCG/ACT inhaler  Commonly known as:  XOPENEX HFA  Inhale 1-2 puffs into the lungs every 4 (four) hours as needed. For shortness of breath     methocarbamol 500 MG tablet  Commonly known as:  ROBAXIN  Take 500 mg by mouth every 8 (eight) hours as needed. SPASMS     nisoldipine 17 MG 24 hr tablet  Commonly known as:  SULAR  Take 17 mg by mouth every morning.     potassium chloride 20 MEQ/15ML (10%) Soln  Take 40 mEq by mouth every evening. Mixed in water. Takes after evening meal so it won't burn stomach     pregabalin 100 MG capsule  Commonly known as:  LYRICA  Take 100-400 mg by mouth 2 (two) times daily. 100 mg every morning and 400 mg every evening     traZODone 100 MG tablet  Commonly known as:  DESYREL  Take 100 mg by mouth at bedtime.          The results of significant diagnostics from this hospitalization (including imaging, microbiology, ancillary and laboratory) are listed below for reference.    Significant Diagnostic Studies: Dg Chest 2 View  06/20/2012  *RADIOLOGY REPORT*  Clinical Data: Evaluate contusion versus pneumonia  CHEST - 2 VIEW  Comparison: 06/19/2012  Findings: Right upper lobe airspace disease shows mild improvement. This may be an area of pneumonia.  Remaining lungs are clear.  Cardiac enlargement without heart failure.  Thoracic and lumbar fusion.  IMPRESSION: Clearing right upper lobe airspace disease may represent atelectasis or pneumonia.  Cardiac enlargement without heart failure.   Original Report Authenticated By: Janeece Riggers, M.D.    Dg Femur Left  06/20/2012  *RADIOLOGY REPORT*  Clinical Data: Femur fracture  LEFT FEMUR - 2 VIEW  Comparison: 800 hours  Findings: Images demonstrate fixation of the femur fracture with a dynamic  compression screw and intramedullary rod.  Two distal interlocking screws are in place.  Anatomic alignment.  IMPRESSION: ORIF femur fracture.   Original Report Authenticated By: Jolaine Click, M.D.    Dg Femur Left Port  06/20/2012  *RADIOLOGY REPORT*  Clinical Data: Left femoral fracture  PORTABLE LEFT FEMUR - 2 VIEW  Comparison:  06/19/2012  Findings: Two views of the left femur submitted.  Again noted displaced oblique fracture proximal shaft of the left femur.  IMPRESSION: Again noted displaced oblique fracture proximal shaft of left femur.   Original  Report Authenticated By: Natasha Mead, M.D.    Dg C-arm 61-120 Min-no Report  06/20/2012  CLINICAL DATA: Left femoral nail   C-ARM 61-120 MINUTES  Fluoroscopy was utilized by the requesting physician.  No radiographic  interpretation.      Microbiology: Recent Results (from the past 240 hour(s))  SURGICAL PCR SCREEN     Status: None   Collection Time    06/20/12  8:48 AM      Result Value Range Status   MRSA, PCR NEGATIVE  NEGATIVE Final   Staphylococcus aureus NEGATIVE  NEGATIVE Final   Comment:            The Xpert SA Assay (FDA     approved for NASAL specimens     in patients over 17 years of age),     is one component of     a comprehensive surveillance     program.  Test performance has     been validated by [employer] for patients greater     than or equal to 61 year old.     It is not intended     to diagnose infection nor to     guide or monitor treatment.     Labs: Basic Metabolic Panel:  Recent Labs Lab 06/20/12 0650 06/20/12 0943 06/21/12 0500 06/22/12 0438  NA  --  141 138 134*  K  --  4.4 4.1 3.9  CL  --  103 103 98  CO2  --  29 30 30   GLUCOSE  --  108* 123* 113*  BUN  --  22 21 12   CREATININE 0.91 1.10 0.85 0.78  CALCIUM  --  8.8 8.6 8.2*   Liver Function Tests: No results found for this basename: AST, ALT, ALKPHOS, BILITOT, PROT, ALBUMIN,  in the last 168 hours No results found for this basename:  LIPASE, AMYLASE,  in the last 168 hours No results found for this basename: AMMONIA,  in the last 168 hours CBC:  Recent Labs Lab 06/20/12 0650 06/21/12 0500 06/22/12 0438  WBC 8.2 6.1 5.1  HGB 11.7* 9.3* 8.4*  HCT 35.6* 27.9* 25.1*  MCV 87.7 87.5 88.1  PLT 144* 88* 73*   Cardiac Enzymes: No results found for this basename: CKTOTAL, CKMB, CKMBINDEX, TROPONINI,  in the last 168 hours BNP: BNP (last 3 results)  Recent Labs  09/20/11 0315 03/06/12 1842 03/06/12 2322  PROBNP 28054.0* 291.1* 260.5*   CBG: No results found for this basename: GLUCAP,  in the last 168 hours     Signed:  REGALADO,BELKYS  [facility] 06/22/2012, 12:16 PM

## 2012-06-23 DIAGNOSIS — D62 Acute posthemorrhagic anemia: Secondary | ICD-10-CM

## 2012-06-23 LAB — TYPE AND SCREEN
Antibody Screen: NEGATIVE
Unit division: 0

## 2012-06-23 LAB — BASIC METABOLIC PANEL
CO2: 28 mEq/L (ref 19–32)
Chloride: 102 mEq/L (ref 96–112)
Potassium: 3.7 mEq/L (ref 3.5–5.1)
Sodium: 136 mEq/L (ref 135–145)

## 2012-06-23 LAB — CBC
Platelets: 81 10*3/uL — ABNORMAL LOW (ref 150–400)
RBC: 3.67 MIL/uL — ABNORMAL LOW (ref 3.87–5.11)
WBC: 5.6 10*3/uL (ref 4.0–10.5)

## 2012-06-23 MED ORDER — OXYCODONE HCL 5 MG PO TABS: 5.0000 mg | ORAL_TABLET | ORAL | Status: DC | PRN

## 2012-06-23 MED ORDER — LAMOTRIGINE 25 MG PO TABS
75.0000 mg | ORAL_TABLET | Freq: Every day | ORAL | Status: DC
  Filled 2012-06-23: qty 3

## 2012-06-23 MED ORDER — CLONAZEPAM 0.5 MG PO TABS: 0.5000 mg | ORAL_TABLET | Freq: Two times a day (BID) | ORAL | Status: DC

## 2012-06-23 MED ORDER — BISACODYL 10 MG RE SUPP
10.0000 mg | Freq: Once | RECTAL | Status: AC
  Administered 2012-06-23: 10 mg via RECTAL

## 2012-06-23 MED ORDER — LAMOTRIGINE 150 MG PO TABS
150.0000 mg | ORAL_TABLET | Freq: Every day | ORAL | Status: DC
  Administered 2012-06-23: 150 mg via ORAL
  Filled 2012-06-23: qty 1

## 2012-06-23 MED ORDER — LEVOFLOXACIN 500 MG PO TABS: 500.0000 mg | ORAL_TABLET | Freq: Every day | ORAL | Status: DC

## 2012-06-23 MED ORDER — ENOXAPARIN SODIUM 40 MG/0.4ML ~~LOC~~ SOLN: 40.0000 mg | SUBCUTANEOUS | Status: DC

## 2012-06-23 MED ORDER — BACLOFEN 10 MG PO TABS: 10.0000 mg | ORAL_TABLET | Freq: Every day | ORAL | Status: DC

## 2012-06-23 MED ORDER — LAMOTRIGINE 150 MG PO TABS: 150.0000 mg | ORAL_TABLET | Freq: Every day | ORAL | Status: DC

## 2012-06-23 NOTE — Progress Notes (Signed)
Orthopedics Progress Note  Subjective: I cannot go number 2  Objective:  Filed Vitals:   06/23/12 0536  BP: 129/65  Pulse: 75  Temp: 98.5 F (36.9 C)  Resp: 17    General: Awake and alert  Musculoskeletal: left leg wounds CDI, mod swelling, no cords Neurovascularly intact  Lab Results  Component Value Date   WBC 5.6 06/23/2012   HGB 10.8* 06/23/2012   HCT 31.3* 06/23/2012   MCV 85.3 06/23/2012   PLT 81* 06/23/2012       Component Value Date/Time   NA 134* 06/22/2012 0438   K 3.9 06/22/2012 0438   CL 98 06/22/2012 0438   CO2 30 06/22/2012 0438   GLUCOSE 113* 06/22/2012 0438   BUN 12 06/22/2012 0438   CREATININE 0.78 06/22/2012 0438   CALCIUM 8.2* 06/22/2012 0438   GFRNONAA 84* 06/22/2012 0438   GFRAA >90 06/22/2012 0438    Lab Results  Component Value Date   INR 1.02 03/06/2012   INR 1.35 09/15/2011   INR 1.12 01/30/2010    Assessment/Plan: POD 4 s/p Procedure(s): INTRAMEDULLARY (IM) NAIL FEMORAL Stable this AM. Feels much better after the transfusion yesterday. Still needs a lot of help getting up. [facility] for rehab later today. Constipation: will give suppository and get her on the commode. Needs to void prior to D/C  Almedia Balls. Ranell Patrick, MD 06/23/2012 8:47 AM

## 2012-06-23 NOTE — Progress Notes (Signed)
Patient discharged to [facility]. Ambulance will be transporting. Pain medication will be given before discharge. Report called in to [place] at [facility].

## 2012-06-23 NOTE — Clinical Social Work Note (Signed)
Pt to transfer to [facility] today via [facility]. Pt, pt's dtr, and SNF aware of transfer. D/C packet complete with signed FL2, chart copy, and signed hard Rx. CSW signing off as no other CSW needs identified at this time.  Dellie Burns, MSW, LCSWA [phone] (Weekends 8:00am-4:30pm)

## 2012-06-23 NOTE — Discharge Summary (Signed)
Physician Discharge Summary  [name] AVW:[id number] DOB: [birth year] DOA: 06/20/2012  PCP: Feliciana Rossetti, MD  Admit date: 06/20/2012 Discharge date: 06/23/2012  Time spent: 30 minutes   Recommendations for Outpatient Follow-up:  1. Need cbc to monitor platelet and hb level.  2. Needs to follow up with Dr Arthur Holms.    Discharge Diagnoses:  Principal Problem:  Femur fracture, left  Active Problems:  S/P spinal surgery  Depression  Asthma, chronic   Discharge Condition: stable.  Diet recommendation: Heart Healthy  Filed Weights    06/20/12 2113   Weight:  102.513 kg (226 lb)    History of present illness:  [name] is an 67 y.o. female with hx of syncope, admitted 12/14, felt to be noncardiac in etiology, hx of asthma, depression, anxiety, HTN, PVD, s/p spinal surgery, presents to [facility] after a mechanical fall and was found to have a left proximal femur fracture. She requested transfer to [facility] for surgery. Dr Shon Baton was consulted and will see her in the am. Further work up included a CXR which question aspiration vs contusion, a normal WBC, Hb 12.9g/DL, normal renal fx tests. Her EKG showed SR with no acute ST-T changes. She has mild rhabdo with CPK of 279 and negative troponin. Hospitalist was asked to admit her for left hip fx.   Hospital Course:  1-Left Femur Fracture: S/P Intramedullary nail femoral antegrade trochanteric entry, Biomet 4-3.  PT per ortho. DVT prophylaxis: Lovenox per ortho. Monitor platelet level.  2-PNA. Community. Chest x ray with infiltrate vs atelectasis. Patient relates cough started prior to admission. Continue with Levaquin day 3. Will provide 5 more days.  3-Depresion, anxiety: Continue with home medications.  4-Anemia, acute blood loss, expected post surgery. Hb decrease to 8. She will received 2 units PRBC 4-4. Hb stable at 10. 5-Asthma: Stable.  6-Urine retention: post surgery : resolved. Monitor for urine retention.  7-Thrombocytopenia:  probably increase consumption post bleed, surgery. Monitor on Lovenox. Platelet increasing.  8-Depression Continue with home medication.careful with oversedation.   Procedures:  Intramedullary nail femoral antegrade trochanteric entry, Biomet 4-3      Filed Weights   06/20/12 2113  Weight: 102.513 kg (226 lb)      Consultations:  Dr Ranell Patrick  Discharge Exam: Filed Vitals:   06/22/12 2045 06/22/12 2145 06/22/12 2155 06/23/12 0536  BP: 119/57 107/57 115/57 129/65  Pulse: 70 69 69 75  Temp: 98.1 F (36.7 C) 98.7 F (37.1 C) 98.3 F (36.8 C) 98.5 F (36.9 C)  TempSrc:    Oral  Resp: 16 18 16 17   Height:      Weight:      SpO2:    94%   General: No distress.  Cardiovascular: S 1, S 2 RRR  Respiratory: CTA  Abdomen: soft, NT  Left Extremity with clean dressing.    Discharge Instructions  Discharge Orders   Future Orders Complete By Expires     Diet - low sodium heart healthy  As directed     Increase activity slowly  As directed     Partial weight bearing  As directed     Comments:      25% body weight        Medication List    TAKE these medications       atorvastatin 20 MG tablet  Commonly known as:  LIPITOR  Take 20 mg by mouth every evening.     baclofen 10 MG tablet  Commonly known as:  LIORESAL  Take 10 mg by mouth at bedtime.     bimatoprost 0.01 % Soln  Commonly known as:  LUMIGAN  Place 1 drop into both eyes 2 (two) times daily.     brinzolamide 1 % ophthalmic suspension  Commonly known as:  AZOPT  Place 1 drop into both eyes 2 (two) times daily.     budesonide-formoterol 160-4.5 MCG/ACT inhaler  Commonly known as:  SYMBICORT  Inhale 1-2 puffs into the lungs 2 (two) times daily. 2 puffs every morning and 1 puff every evening     bumetanide 2 MG tablet  Commonly known as:  BUMEX  Take 4 mg by mouth daily as needed. For swelling     cetirizine 10 MG tablet  Commonly known as:  ZYRTEC  Take 10 mg by mouth daily.     clonazePAM 0.5  MG tablet  Commonly known as:  KLONOPIN  Take 0.5 mg by mouth 2 (two) times daily.     DEXILANT 60 MG capsule  Generic drug:  dexlansoprazole  Take 60 mg by mouth every evening.     enoxaparin 40 MG/0.4ML injection  Commonly known as:  LOVENOX  Inject 0.4 mLs (40 mg total) into the skin daily.     FLUoxetine 20 MG tablet  Commonly known as:  PROZAC  Take 40 mg by mouth every morning. PT TAKES 2 CAPS FOR 40MG  DOSAGE     fluticasone 50 MCG/ACT nasal spray  Commonly known as:  FLONASE  Place 2 sprays into the nose 2 (two) times daily as needed.     lamoTRIgine 150 MG tablet  Commonly known as:  LAMICTAL  Take 75-150 mg by mouth 2 (two) times daily. Take 150 mg every morning and 75 mg every evening     levalbuterol 45 MCG/ACT inhaler  Commonly known as:  XOPENEX HFA  Inhale 1-2 puffs into the lungs every 4 (four) hours as needed. For shortness of breath     levofloxacin 500 MG tablet  Commonly known as:  LEVAQUIN  Take 1 tablet (500 mg total) by mouth daily.     methocarbamol 500 MG tablet  Commonly known as:  ROBAXIN  Take 500 mg by mouth every 8 (eight) hours as needed. SPASMS     nisoldipine 17 MG 24 hr tablet  Commonly known as:  SULAR  Take 17 mg by mouth every morning.     oxyCODONE 5 MG immediate release tablet  Commonly known as:  Oxy IR/ROXICODONE  Take 1 tablet (5 mg total) by mouth every 4 (four) hours as needed.     potassium chloride 20 MEQ/15ML (10%) Soln  Take 40 mEq by mouth every evening. Mixed in water. Takes after evening meal so it won't burn stomach     pregabalin 100 MG capsule  Commonly known as:  LYRICA  Take 100-400 mg by mouth 2 (two) times daily. 100 mg every morning and 400 mg every evening     traZODone 100 MG tablet  Commonly known as:  DESYREL  Take 100 mg by mouth at bedtime.           Follow-up Information   Follow up with NORRIS,STEVEN R, MD. Schedule an appointment as soon as possible for a visit in 2 weeks. [phone])     Contact information:   [address] [place] [address] [phone]        The results of significant diagnostics from this hospitalization (including imaging, microbiology, ancillary and  laboratory) are listed below for reference.    Significant Diagnostic Studies: Dg Chest 2 View  06/20/2012  *RADIOLOGY REPORT*  Clinical Data: Evaluate contusion versus pneumonia  CHEST - 2 VIEW  Comparison: 06/19/2012  Findings: Right upper lobe airspace disease shows mild improvement. This may be an area of pneumonia.  Remaining lungs are clear.  Cardiac enlargement without heart failure.  Thoracic and lumbar fusion.  IMPRESSION: Clearing right upper lobe airspace disease may represent atelectasis or pneumonia.  Cardiac enlargement without heart failure.   Original Report Authenticated By: Janeece Riggers, M.D.    Dg Femur Left  06/20/2012  *RADIOLOGY REPORT*  Clinical Data: Femur fracture  LEFT FEMUR - 2 VIEW  Comparison: 800 hours  Findings: Images demonstrate fixation of the femur fracture with a dynamic compression screw and intramedullary rod.  Two distal interlocking screws are in place.  Anatomic alignment.  IMPRESSION: ORIF femur fracture.   Original Report Authenticated By: Jolaine Click, M.D.    Dg Femur Left Port  06/20/2012  *RADIOLOGY REPORT*  Clinical Data: Left femoral fracture  PORTABLE LEFT FEMUR - 2 VIEW  Comparison:  06/19/2012  Findings: Two views of the left femur submitted.  Again noted displaced oblique fracture proximal shaft of the left femur.  IMPRESSION: Again noted displaced oblique fracture proximal shaft of left femur.   Original Report Authenticated By: Natasha Mead, M.D.    Dg C-arm 61-120 Min-no Report  06/20/2012  CLINICAL DATA: Left femoral nail   C-ARM 61-120 MINUTES  Fluoroscopy was utilized by the requesting physician.  No radiographic  interpretation.      Microbiology: Recent Results (from the past 240 hour(s))  SURGICAL PCR  SCREEN     Status: None   Collection Time    06/20/12  8:48 AM      Result Value Range Status   MRSA, PCR NEGATIVE  NEGATIVE Final   Staphylococcus aureus NEGATIVE  NEGATIVE Final   Comment:            The Xpert SA Assay (FDA     approved for NASAL specimens     in patients over 57 years of age),     is one component of     a comprehensive surveillance     program.  Test performance has     been validated by [employer] for patients greater     than or equal to 30 year old.     It is not intended     to diagnose infection nor to     guide or monitor treatment.     Labs: Basic Metabolic Panel:  Recent Labs Lab 06/20/12 0650 06/20/12 0943 06/21/12 0500 06/22/12 0438 06/23/12 0811  NA  --  141 138 134* 136  K  --  4.4 4.1 3.9 3.7  CL  --  103 103 98 102  CO2  --  29 30 30 28   GLUCOSE  --  108* 123* 113* 184*  BUN  --  22 21 12 14   CREATININE 0.91 1.10 0.85 0.78 0.70  CALCIUM  --  8.8 8.6 8.2* 8.7   Liver Function Tests: No results found for this basename: AST, ALT, ALKPHOS, BILITOT, PROT, ALBUMIN,  in the last 168 hours No results found for this basename: LIPASE, AMYLASE,  in the last 168 hours No results found for this basename: AMMONIA,  in the last 168 hours CBC:  Recent Labs Lab 06/20/12 0650 06/21/12 0500 06/22/12 0438 06/23/12 1610  WBC 8.2 6.1 5.1 5.6  HGB 11.7* 9.3* 8.4* 10.8*  HCT 35.6* 27.9* 25.1* 31.3*  MCV 87.7 87.5 88.1 85.3  PLT 144* 88* 73* 81*   Cardiac Enzymes: No results found for this basename: CKTOTAL, CKMB, CKMBINDEX, TROPONINI,  in the last 168 hours BNP: BNP (last 3 results)  Recent Labs  09/20/11 0315 03/06/12 1842 03/06/12 2322  PROBNP 28054.0* 291.1* 260.5*   CBG: No results found for this basename: GLUCAP,  in the last 168 hours     Signed:  Mansur Patti  [facility] 06/23/2012, 10:51 AM

## 2012-06-25 ENCOUNTER — Encounter (HOSPITAL_COMMUNITY): Payer: Self-pay | Admitting: Orthopedic Surgery

## 2012-06-25 ENCOUNTER — Other Ambulatory Visit: Payer: Self-pay | Admitting: *Deleted

## 2012-06-25 MED ORDER — HYDROCODONE-ACETAMINOPHEN 5-325 MG PO TABS: ORAL_TABLET | ORAL | Status: DC

## 2012-06-25 MED ORDER — OXYCODONE HCL 5 MG PO TABS: ORAL_TABLET | ORAL | Status: DC

## 2012-06-26 ENCOUNTER — Non-Acute Institutional Stay (SKILLED_NURSING_FACILITY): Payer: Medicare Other | Admitting: Internal Medicine

## 2012-06-26 DIAGNOSIS — K5900 Constipation, unspecified: Secondary | ICD-10-CM

## 2012-06-26 DIAGNOSIS — D62 Acute posthemorrhagic anemia: Secondary | ICD-10-CM

## 2012-06-26 DIAGNOSIS — S7292XE Unspecified fracture of left femur, subsequent encounter for open fracture type I or II with routine healing: Secondary | ICD-10-CM

## 2012-06-26 DIAGNOSIS — J189 Pneumonia, unspecified organism: Secondary | ICD-10-CM

## 2012-06-26 DIAGNOSIS — S7290XD Unspecified fracture of unspecified femur, subsequent encounter for closed fracture with routine healing: Secondary | ICD-10-CM

## 2012-06-27 ENCOUNTER — Other Ambulatory Visit: Payer: Self-pay | Admitting: *Deleted

## 2012-06-27 MED ORDER — HYDROCODONE-ACETAMINOPHEN 7.5-325 MG PO TABS: ORAL_TABLET | ORAL | Status: DC

## 2012-07-02 ENCOUNTER — Non-Acute Institutional Stay (SKILLED_NURSING_FACILITY): Payer: Medicare Other | Admitting: Adult Health

## 2012-07-02 DIAGNOSIS — R600 Localized edema: Secondary | ICD-10-CM

## 2012-07-02 DIAGNOSIS — R609 Edema, unspecified: Secondary | ICD-10-CM

## 2012-07-11 NOTE — Progress Notes (Signed)
Patient ID: [name], female   DOB: [birth year], 67 y.o.   MRN: [id number]        HISTORY & PHYSICAL  DATE:  06/26/2012  FACILITY: [facility]   LEVEL OF CARE: SNF   ALLERGIES:  Allergies  Allergen Reactions  . Latex Hives  . Peanuts (Peanut Oil) Anaphylaxis and Swelling  . Sulfa Antibiotics Shortness Of Breath  . Neosporin (Neomycin-Polymyxin-Gramicidin) Rash  . Penicillins Rash    CHIEF COMPLAINT:  Manage left femur fracture, acute blood loss anemia and pneumonia.   HISTORY OF PRESENT ILLNESS:  67 year-old female.   HIP FRACTURE: The patient had a mechanical fall and sustained a femur fracture.  Patient subsequently underwent intramedullary nail fixation and tolerated the procedure well. Patient is admitted to this facility for short-term rehabilitation. Patient denies hip pain currently. No complications reported from the pain medications currently being used.   ANEMIA: Postoperatively, hemoglobin dropped to 8.  Therefore, she was transfused 2 U of packed red blood cells.  Last hemoglobin level was 8.4.  The anemia has been stable. The patient denies fatigue, melena or hematochezia. She is not on iron.   PNEUMONIA:  Chest x-ray showed possible infiltrate.  She is currently on Levaquin and tolerates it without any problems.  She denies cough, shortness of breath, fever, chills or night sweats.    PAST MEDICAL HISTORY :  Past Medical History  Diagnosis Date  . GERD (gastroesophageal reflux disease)   . Peripheral vascular disease   . Cataracts, bilateral   . Glaucoma(365)   . Asthma   . Depression   . Hypertension   . Arthritis   . Anxiety     PAST SURGICAL HISTORY: Past Surgical History  Procedure Laterality Date  . Back surgery    . Bladder suspension      x3  . Knee arhtroscopy    . Hand / finger lesion excision    . Im nailing femoral shaft fracture Right 06/20/2012    Dr Sunnie Nielsen  . Femur im nail Left 06/20/2012    Procedure: INTRAMEDULLARY (IM) NAIL  FEMORAL;  Surgeon: Verlee Rossetti, MD;  Location: [facility] OR;  Service: Orthopedics;  Laterality: Left;    SOCIAL HISTORY:  reports that she has never smoked. She has never used smokeless tobacco. She reports that she drinks about 0.6 ounces of alcohol per week. She reports that she does not use illicit drugs.  FAMILY HISTORY: none  CURRENT MEDICATIONS: Reviewed per MAR  REVIEW OF SYSTEMS:   GI:  Complains of constipation.   MUSCULOSKELETAL:  Uncontrolled left hip pain.    See HPI otherwise 14 point ROS is negative.  PHYSICAL EXAMINATION  VS:  T 97.9       P 68      RR 18       BP 128/58      POX% 93 room air       WT (Lb)  GENERAL: no acute distress, moderately obese body habitus EYES: conjunctivae normal, sclerae normal, normal eye lids MOUTH/THROAT: lips without lesions,no lesions in the mouth,tongue is without lesions,uvula elevates in midline NECK: supple, trachea midline, no neck masses, no thyroid tenderness, no thyromegaly LYMPHATICS: no LAN in the neck, no supraclavicular LAN RESPIRATORY: breathing is even & unlabored, BS CTAB CARDIAC: RRR, no murmur,no extra heart sounds EDEMA/VARICOSITIES:  +1 bilateral lower extremity edema  ARTERIAL:  pedal pulses +1  GI:  ABDOMEN: abdomen soft, normal BS, no masses, no tenderness  LIVER/SPLEEN: no hepatomegaly, no  splenomegaly MUSCULOSKELETAL: HEAD: normal to inspection & palpation BACK: no kyphosis, scoliosis or spinal processes tenderness EXTREMITIES: LEFT UPPER EXTREMITY: full range of motion, normal strength & tone RIGHT UPPER EXTREMITY:  full range of motion, normal strength & tone LEFT LOWER EXTREMITY: strength decreased, range of motion minimal  RIGHT LOWER EXTREMITY: strength decreased, range of motion minimal PSYCHIATRIC: the patient is alert & oriented to person, affect & behavior appropriate  LABS/RADIOLOGY: Glucose 113, otherwise CMP normal.   MRSA by PCR negative.    Staph aureus by PCR negative.   Hemoglobin  8.4, MCV 88.1, platelets 73, white count 5.1.   Chest x-ray showed clearing right upper lobe airspace disease.    Left femur x-ray showed ORIF of femur fracture.    Left hip x-ray prior to surgery showed displaced oblique fracture of the proximal shaft of left femur.    ASSESSMENT/PLAN:  Left femur fracture.  Status post ORIF.  Continue rehabilitation.   Acute blood loss anemia.  Status post transfusion.  Reassess hemoglobin level.  Pneumonia.  Continue Levaquin.   Constipation.  New problem.  Start MiraLAX 17 g q.d.   Left hip pain.  Uncontrolled problem.  Increase Vicodin to 7.5/325, 1 tablet q.6 p.r.n.   Asthma.  Stable.   Thrombocytopenia.  Reassess.   Hypokalemia.  Continue supplementation. Reassess.   Check CBC and BMP.   I have reviewed patient's medical records received at admission/from hospitalization.  CPT CODE: 16109

## 2012-07-20 ENCOUNTER — Other Ambulatory Visit: Payer: Self-pay | Admitting: *Deleted

## 2012-07-20 MED ORDER — CLONAZEPAM 0.5 MG PO TABS: ORAL_TABLET | ORAL | Status: DC

## 2012-07-20 MED ORDER — PREGABALIN 100 MG PO CAPS: ORAL_CAPSULE | ORAL | Status: DC

## 2012-07-31 ENCOUNTER — Non-Acute Institutional Stay (SKILLED_NURSING_FACILITY): Payer: Medicare Other | Admitting: Adult Health

## 2012-07-31 DIAGNOSIS — G4700 Insomnia, unspecified: Secondary | ICD-10-CM

## 2012-07-31 DIAGNOSIS — R600 Localized edema: Secondary | ICD-10-CM

## 2012-07-31 DIAGNOSIS — K5900 Constipation, unspecified: Secondary | ICD-10-CM

## 2012-07-31 DIAGNOSIS — F411 Generalized anxiety disorder: Secondary | ICD-10-CM

## 2012-07-31 DIAGNOSIS — R609 Edema, unspecified: Secondary | ICD-10-CM

## 2012-07-31 DIAGNOSIS — K219 Gastro-esophageal reflux disease without esophagitis: Secondary | ICD-10-CM

## 2012-07-31 DIAGNOSIS — E785 Hyperlipidemia, unspecified: Secondary | ICD-10-CM

## 2012-07-31 DIAGNOSIS — G894 Chronic pain syndrome: Secondary | ICD-10-CM

## 2012-07-31 DIAGNOSIS — S7290XD Unspecified fracture of unspecified femur, subsequent encounter for closed fracture with routine healing: Secondary | ICD-10-CM

## 2012-07-31 DIAGNOSIS — J4550 Severe persistent asthma, uncomplicated: Secondary | ICD-10-CM

## 2012-07-31 DIAGNOSIS — J45909 Unspecified asthma, uncomplicated: Secondary | ICD-10-CM

## 2012-07-31 DIAGNOSIS — S7292XD Unspecified fracture of left femur, subsequent encounter for closed fracture with routine healing: Secondary | ICD-10-CM

## 2012-07-31 DIAGNOSIS — F329 Major depressive disorder, single episode, unspecified: Secondary | ICD-10-CM

## 2012-07-31 DIAGNOSIS — I10 Essential (primary) hypertension: Secondary | ICD-10-CM

## 2012-07-31 DIAGNOSIS — M62838 Other muscle spasm: Secondary | ICD-10-CM

## 2012-07-31 DIAGNOSIS — I5040 Unspecified combined systolic (congestive) and diastolic (congestive) heart failure: Secondary | ICD-10-CM

## 2012-08-15 ENCOUNTER — Non-Acute Institutional Stay (SKILLED_NURSING_FACILITY): Payer: Medicare Other | Admitting: Adult Health

## 2012-08-15 ENCOUNTER — Encounter: Payer: Self-pay | Admitting: Adult Health

## 2012-08-15 DIAGNOSIS — E785 Hyperlipidemia, unspecified: Secondary | ICD-10-CM | POA: Insufficient documentation

## 2012-08-15 DIAGNOSIS — R600 Localized edema: Secondary | ICD-10-CM | POA: Insufficient documentation

## 2012-08-15 DIAGNOSIS — G894 Chronic pain syndrome: Secondary | ICD-10-CM | POA: Insufficient documentation

## 2012-08-15 DIAGNOSIS — I10 Essential (primary) hypertension: Secondary | ICD-10-CM | POA: Insufficient documentation

## 2012-08-15 DIAGNOSIS — I509 Heart failure, unspecified: Secondary | ICD-10-CM

## 2012-08-15 DIAGNOSIS — G4700 Insomnia, unspecified: Secondary | ICD-10-CM | POA: Insufficient documentation

## 2012-08-15 DIAGNOSIS — L02619 Cutaneous abscess of unspecified foot: Secondary | ICD-10-CM

## 2012-08-15 DIAGNOSIS — M62838 Other muscle spasm: Secondary | ICD-10-CM | POA: Insufficient documentation

## 2012-08-15 DIAGNOSIS — L03032 Cellulitis of left toe: Secondary | ICD-10-CM

## 2012-08-15 DIAGNOSIS — K5900 Constipation, unspecified: Secondary | ICD-10-CM | POA: Insufficient documentation

## 2012-08-15 DIAGNOSIS — F411 Generalized anxiety disorder: Secondary | ICD-10-CM | POA: Insufficient documentation

## 2012-08-15 HISTORY — DX: Heart failure, unspecified: I50.9

## 2012-08-15 NOTE — Progress Notes (Signed)
  Subjective:    Patient ID: [name], female    DOB: Jan 30, 1946, 67 y.o.   MRN: [id number]  HPI  This is a 67 year old female who was noted to have left great to to be erythematous, tender to touch, warm to touch and edematous. Site is dry.   Review of Systems  Constitutional: Negative.   HENT: Negative.   Eyes: Negative.   Respiratory: Negative for cough and shortness of breath.   Cardiovascular: Positive for leg swelling.  Gastrointestinal: Negative.   Endocrine: Negative.   Genitourinary: Negative.   Neurological: Negative.   Hematological: Negative for adenopathy. Does not bruise/bleed easily.  Psychiatric/Behavioral: Negative.        Objective:   Physical Exam  Nursing note and vitals reviewed. Constitutional: She appears well-developed and well-nourished.  HENT:  Head: Normocephalic.  Right Ear: External ear normal.  Left Ear: External ear normal.  Eyes: Conjunctivae are normal. Pupils are equal, round, and reactive to light.  Neck: Normal range of motion. Neck supple. No thyromegaly present.  Cardiovascular: Normal rate, regular rhythm and normal heart sounds.   Pulmonary/Chest: Effort normal and breath sounds normal. No respiratory distress.  Abdominal: Soft. Bowel sounds are normal.  Musculoskeletal: She exhibits edema. She exhibits no tenderness.  BLE edema, 2+  Skin: Skin is warm and dry. There is erythema.  Psychiatric: She has a normal mood and affect. Her behavior is normal. Judgment and thought content normal.  See HPI    LABS: 08/06/12  BNP 78.8 08/01/12  Wbc 3.8  hgb 11.1  hct 33.3  Bmp nl    Medications reviewed per [facility]     Assessment & Plan:   Cellulitis of left great toe - start Doxycycline 100 mg PO BID x 14 days; Podiatry consult

## 2012-08-15 NOTE — Progress Notes (Signed)
  Subjective:    Patient ID: [name], female    DOB: [birth year], 67 y.o.   MRN: [id number]  HPI This is a 67 year old female who was noted to have bilateral lower extremity edema, 2+. No complaints of tenderness nor noted bruising. Patient requesting to take Bumex since she used to take them when she notices edema on her legs.   Review of Systems  Constitutional: Negative.   HENT: Negative.   Eyes: Negative.   Respiratory: Negative for cough and shortness of breath.   Cardiovascular: Positive for leg swelling. Negative for chest pain.  Gastrointestinal: Negative for abdominal pain and abdominal distention.  Endocrine: Negative.   Genitourinary: Negative.   Neurological: Negative.   Hematological: Negative for adenopathy. Does not bruise/bleed easily.  Psychiatric/Behavioral: Negative.        Objective:   Physical Exam  Nursing note and vitals reviewed. Constitutional: She is oriented to person, place, and time.  Cardiovascular: Normal rate, regular rhythm and normal heart sounds.   Pulmonary/Chest: Effort normal and breath sounds normal. No respiratory distress.  Abdominal: Soft. Bowel sounds are normal. She exhibits no distension.  Musculoskeletal: She exhibits edema. She exhibits no tenderness.  BLE edema, 2+  Neurological: She is alert and oriented to person, place, and time.  Skin: Skin is warm and dry.  Psychiatric: She has a normal mood and affect. Her behavior is normal. Judgment and thought content normal.   LABS: 06/27/12  Wbc 4.2  hgb 10.4  hct 31.4  NA 141  K 3.9  Glucose 128  BUN 20  Creatinine 0.78  Medications reviewed per [facility]     Assessment & Plan:   Bilateral Lower extremity edema - start Bumex 0.5 mg 1 tab PO Q D; BMP in 1 week; weigh 3X/week, notify MD/NP +- 5 lbs

## 2012-08-15 NOTE — Progress Notes (Signed)
  Subjective:    Patient ID: [name], female    DOB: 02-11-1946, 67 y.o.   MRN: [id number]  HPI This is a 67 year old female who is being seen for a routine visit. She is complaining that she has generalized body pain and shortness of breath at night. She has a history of CHF and was recently started on Bumex for BLE edema.   Review of Systems  Constitutional: Negative.   HENT: Negative.   Eyes: Negative.   Respiratory: Positive for shortness of breath. Negative for cough and chest tightness.   Cardiovascular: Positive for leg swelling.  Gastrointestinal: Negative for abdominal pain and abdominal distention.  Endocrine: Negative.   Genitourinary: Negative.   Neurological: Negative.   Hematological: Negative for adenopathy. Does not bruise/bleed easily.  Psychiatric/Behavioral: Negative.        Objective:   Physical Exam  Nursing note and vitals reviewed. Constitutional: She is oriented to person, place, and time. She appears well-developed and well-nourished.  HENT:  Head: Atraumatic.  Right Ear: External ear normal.  Left Ear: External ear normal.  Eyes: Conjunctivae are normal. Pupils are equal, round, and reactive to light.  Neck: Normal range of motion. Neck supple. No thyromegaly present.  Cardiovascular: Normal rate, regular rhythm and normal heart sounds.   Pulmonary/Chest: Effort normal and breath sounds normal. No respiratory distress.  Abdominal: Soft. Bowel sounds are normal. She exhibits no distension.  Musculoskeletal: She exhibits edema. She exhibits no tenderness.  BLE edema, 2+  Neurological: She is alert and oriented to person, place, and time.  Skin: Skin is warm and dry.  Psychiatric: She has a normal mood and affect. Her behavior is normal. Judgment and thought content normal.    LABS:  4/14  Wbc 4.2  hgb 10.4  hct 31.4  Bmp nl except glucose 128  Calcium 8.3 07/27/12  Wbc 4.4  hgb 12.2  hct 36.6  Bmp nl  Medications reviewed per [facility]      Assessment & Plan:   Bilateral lower extremity edema - continue Bumex  CHF - check BNP  Chronic pain syndrome - continue Lyrica; start Hydrocodone/Acetam  Essential hypertension, benign - well-controlled; continue Nisoldipine  Unspecified constipation -  No complaints of; continue Miralax  Generalized anxiety disorder - stable; continue Klonopin  Other and unspecified hyperlipidemia - continue Lipitor  Insomnia - no complaints; continue Trazodone  Muscle spasm - stable; continue Baclofen  Femur fracture, left S/P IM nail - continue PT and OT  Asthma, chronic - stable; start albuterol 2.5 mg/37ml i neb Q 6AM and 9 PM; continue Klonopin, Zyrtec and Symbicort  GERD (gastroesophageal reflux disease) - continue Prilosec  Depression - stable; continue Prozac

## 2012-08-20 ENCOUNTER — Other Ambulatory Visit: Payer: Self-pay | Admitting: *Deleted

## 2012-08-20 MED ORDER — HYDROCODONE-ACETAMINOPHEN 7.5-325 MG PO TABS: ORAL_TABLET | ORAL | Status: DC

## 2012-09-05 ENCOUNTER — Non-Acute Institutional Stay (SKILLED_NURSING_FACILITY): Payer: Medicare Other | Admitting: Adult Health

## 2012-09-05 DIAGNOSIS — J189 Pneumonia, unspecified organism: Secondary | ICD-10-CM

## 2012-09-07 ENCOUNTER — Non-Acute Institutional Stay (SKILLED_NURSING_FACILITY): Payer: Medicare Other | Admitting: Internal Medicine

## 2012-09-07 DIAGNOSIS — J4520 Mild intermittent asthma, uncomplicated: Secondary | ICD-10-CM

## 2012-09-07 DIAGNOSIS — E785 Hyperlipidemia, unspecified: Secondary | ICD-10-CM

## 2012-09-07 DIAGNOSIS — G4700 Insomnia, unspecified: Secondary | ICD-10-CM

## 2012-09-07 DIAGNOSIS — J45909 Unspecified asthma, uncomplicated: Secondary | ICD-10-CM

## 2012-09-07 DIAGNOSIS — J189 Pneumonia, unspecified organism: Secondary | ICD-10-CM

## 2012-09-09 DIAGNOSIS — G4700 Insomnia, unspecified: Secondary | ICD-10-CM | POA: Insufficient documentation

## 2012-09-09 NOTE — Progress Notes (Signed)
PROGRESS NOTE  DATE: 09/07/2012  FACILITY: Nursing Home Location: [facility]  LEVEL OF CARE: SNF (31)  Routine Visit  CHIEF COMPLAINT:  Manage pneumonia, asthma and hyperlipidemia  HISTORY OF PRESENT ILLNESS:  REASSESSMENT OF ONGOING PROBLEM(S):  PNEUMONIA: The pneumonia remains stable.  The patient denies ongoing chest pain, cough, shortness of breath, fever, chills or night sweats. No complications reported from the current antibiotic being used. On 6/17 chest x-ray showed patchy pneumonitis. Patient is complaining of fatigue.  ASTHMA: The patient's asthma remains stable. Patient denies shortness of breath, dyspnea on exertion or wheezing. No complications reported from the medications currently being used.  HYPERLIPIDEMIA: No complications from the medications presently being used. Last fasting lipid panel not available.  PAST MEDICAL HISTORY : Reviewed.  No changes.  CURRENT MEDICATIONS: Reviewed per [facility]  REVIEW OF SYSTEMS:  GENERAL: no change in appetite, c/o fatigue, no weight changes, no fever, chills, c/o weakness RESPIRATORY: no cough, SOB, DOE, wheezing, hemoptysis CARDIAC: no chest pain, edema or palpitations GI: no abdominal pain, diarrhea, constipation, heart burn, nausea or vomiting  PHYSICAL EXAMINATION  VS:  T 98.3       P 58     RR 22      BP 139/74     POX % 94     WT (Lb)  GENERAL: no acute distress, moderately obese body habitus EYES: conjunctivae normal, sclerae normal, normal eye lids NECK: supple, trachea midline, no neck masses, no thyroid tenderness, no thyromegaly LYMPHATICS: no LAN in the neck, no supraclavicular LAN RESPIRATORY: breathing is even & unlabored, BS decreased bilaterally CARDIAC: RRR, no murmur,no extra heart sounds, no edema GI: abdomen soft, normal BS, no masses, no tenderness, no hepatomegaly, no splenomegaly PSYCHIATRIC: the patient is alert & oriented to person, affect & mood  depressed  LABS/RADIOLOGY:  5/14 WBC 3.8, hemoglobin 11.1, MCV 85.8, platelets 147, BMP normal  ASSESSMENT/PLAN:  Pneumonia-on Avelox. Asthma-stable. Hyperlipidemia-check fasting lipid panel. Insomnia-denies ongoing symptoms. Chronic pain-denies pain. Constipation-well-controlled. Depression-continue current antidepressant. GERD-stable. check liver profile.  CPT CODE: 16109

## 2012-09-11 ENCOUNTER — Non-Acute Institutional Stay (SKILLED_NURSING_FACILITY): Payer: Medicare Other | Admitting: Adult Health

## 2012-09-11 DIAGNOSIS — J189 Pneumonia, unspecified organism: Secondary | ICD-10-CM

## 2012-09-11 DIAGNOSIS — J45909 Unspecified asthma, uncomplicated: Secondary | ICD-10-CM

## 2012-09-13 ENCOUNTER — Non-Acute Institutional Stay (SKILLED_NURSING_FACILITY): Payer: Medicare Other | Admitting: Adult Health

## 2012-09-13 DIAGNOSIS — R059 Cough, unspecified: Secondary | ICD-10-CM

## 2012-09-13 DIAGNOSIS — R05 Cough: Secondary | ICD-10-CM

## 2012-09-13 DIAGNOSIS — J45909 Unspecified asthma, uncomplicated: Secondary | ICD-10-CM

## 2012-09-17 ENCOUNTER — Encounter: Payer: Self-pay | Admitting: Adult Health

## 2012-09-17 DIAGNOSIS — J189 Pneumonia, unspecified organism: Secondary | ICD-10-CM | POA: Insufficient documentation

## 2012-09-17 DIAGNOSIS — R05 Cough: Secondary | ICD-10-CM | POA: Insufficient documentation

## 2012-09-17 NOTE — Progress Notes (Signed)
  Subjective:    Patient ID: [name], female    DOB: 11-25-1945, 67 y.o.   MRN: [id number]  HPI  This is a 67 year old female who was noted to have wheezing on bilateral lung fields. She is currently on last dose of Avelox for Pneumonitis. No SOB noted.     Review of Systems  Constitutional: Negative.   HENT: Negative.   Eyes: Negative.   Respiratory: Positive for wheezing. Negative for cough and shortness of breath.   Cardiovascular: Positive for leg swelling.  Gastrointestinal: Negative for abdominal distention.  Endocrine: Negative.   Genitourinary: Negative.   Neurological: Negative.   Hematological: Negative for adenopathy. Does not bruise/bleed easily.  Psychiatric/Behavioral: Negative.        Objective:   Physical Exam  Nursing note and vitals reviewed. Constitutional: She is oriented to person, place, and time. She appears well-developed and well-nourished.  HENT:  Head: Normocephalic and atraumatic.  Right Ear: External ear normal.  Left Ear: External ear normal.  Nose: Nose normal.  Mouth/Throat: Oropharynx is clear and moist.  Eyes: Conjunctivae and EOM are normal. Pupils are equal, round, and reactive to light.  Neck: Normal range of motion. Neck supple. No thyromegaly present.  Cardiovascular: Normal rate, regular rhythm and normal heart sounds.   Pulmonary/Chest: Effort normal. She has wheezes.  Abdominal: Soft. Bowel sounds are normal.  Musculoskeletal: She exhibits edema. She exhibits no tenderness.  BLE edema, 2+  Lymphadenopathy:    She has no cervical adenopathy.  Neurological: She is alert and oriented to person, place, and time.  Skin: Skin is warm and dry.  Psychiatric: She has a normal mood and affect. Her behavior is normal. Judgment and thought content normal.    LABS/PROCEDURES:  4/14  Wbc 4.2  hgb 10.4  hct 31.4  Bmp nl except glucose 128  Calcium 8.3 07/27/12  Wbc 4.4  hgb 12.2  hct 36.6  Bmp nl 08/01/12  Wbc 3.8  hgb 11.1  hct 33.3  NA  142  K 4.3  Glucose 94  BUN 21  Creatinine 0.76 08/06/12  BNP 78.8 09/04/12  Chest x-ray shows patchy bibasilar atelectasis or pneumonitis; minimal cardiomegaly w/o pulmonary vascular congestion; no pleural effusion    Medications reviewed per [facility]        Assessment & Plan:   Pneumonitis - continue Avelox  Asthma, chronic - increase Albuterol 2.5 mg/108ml 1 neb TID

## 2012-09-17 NOTE — Progress Notes (Signed)
  Subjective:    Patient ID: [name], female    DOB: 23-Mar-1945, 67 y.o.   MRN: [id number]  HPI  This is a 67 year old female who was noted to have wheezing upon exertion. Whenever she starts an activity, she starts coughing and starts wheezing. She does not complain of SOB.      Review of Systems  Constitutional: Negative.   HENT: Negative.   Eyes: Negative.   Respiratory: Positive for wheezing. Negative for cough and shortness of breath.   Cardiovascular: Positive for leg swelling.  Gastrointestinal: Negative for abdominal distention.  Endocrine: Negative.   Genitourinary: Negative.   Neurological: Negative.   Hematological: Negative for adenopathy. Does not bruise/bleed easily.  Psychiatric/Behavioral: Negative.        Objective:   Physical Exam  Nursing note and vitals reviewed. Constitutional: She is oriented to person, place, and time. She appears well-developed and well-nourished.  HENT:  Head: Normocephalic and atraumatic.  Right Ear: External ear normal.  Left Ear: External ear normal.  Nose: Nose normal.  Mouth/Throat: Oropharynx is clear and moist.  Eyes: Conjunctivae and EOM are normal. Pupils are equal, round, and reactive to light.  Neck: Normal range of motion. Neck supple. No thyromegaly present.  Cardiovascular: Normal rate, regular rhythm and normal heart sounds.   Pulmonary/Chest: Effort normal. She has wheezes.  Abdominal: Soft. Bowel sounds are normal.  Musculoskeletal: She exhibits edema. She exhibits no tenderness.  BLE edema, 2+  Lymphadenopathy:    She has no cervical adenopathy.  Neurological: She is alert and oriented to person, place, and time.  Skin: Skin is warm and dry.  Psychiatric: She has a normal mood and affect. Her behavior is normal. Judgment and thought content normal.    LABS/PROCEDURES:  4/14  Wbc 4.2  hgb 10.4  hct 31.4  Bmp nl except glucose 128  Calcium 8.3 07/27/12  Wbc 4.4  hgb 12.2  hct 36.6  Bmp nl 08/01/12  Wbc 3.8   hgb 11.1  hct 33.3  NA 142  K 4.3  Glucose 94  BUN 21  Creatinine 0.76 08/06/12  BNP 78.8 09/04/12  Chest x-ray shows patchy bibasilar atelectasis or pneumonitis; minimal cardiomegaly w/o pulmonary vascular congestion; no pleural effusion 09/10/12  Cholesterol 129 triglyceride 75  HDL 53  LDL 61 09/13/12  Chest x-ray  Shows minimal cardiomegaly unchanged with mild pulmonary vascular congestion; patchy bibasilar atelectasis or pneumonitis improved; no pleural effusion   Medications reviewed per [facility]        Assessment & Plan:   Asthma, chronic - start Prednisone taper dose pack  Cough - start Tussi caps 8/10 1 tab PO Q AM

## 2012-09-17 NOTE — Progress Notes (Signed)
  Subjective:    Patient ID: [name], female    DOB: 10-Jun-1945, 67 y.o.   MRN: [id number]  HPI This is a 67 year old female who has been noted to have productive cough with yellowish to whitish phlegm. Noted to have wheezing on bilateral lung fields. Chest x-ray shows bilateral pneumonitis. Patient is alert and verbally responsive.   Review of Systems  Constitutional: Negative.   HENT: Negative.   Eyes: Negative.   Respiratory: Positive for cough and wheezing. Negative for shortness of breath.   Cardiovascular: Positive for leg swelling.  Gastrointestinal: Negative for abdominal distention.  Endocrine: Negative.   Genitourinary: Negative.   Neurological: Negative.   Hematological: Negative for adenopathy. Does not bruise/bleed easily.  Psychiatric/Behavioral: Negative.        Objective:   Physical Exam  Nursing note and vitals reviewed. Constitutional: She is oriented to person, place, and time. She appears well-developed and well-nourished.  HENT:  Head: Normocephalic and atraumatic.  Right Ear: External ear normal.  Left Ear: External ear normal.  Nose: Nose normal.  Mouth/Throat: Oropharynx is clear and moist.  Eyes: Conjunctivae and EOM are normal. Pupils are equal, round, and reactive to light.  Neck: Normal range of motion. Neck supple. No thyromegaly present.  Cardiovascular: Normal rate, regular rhythm and normal heart sounds.   Pulmonary/Chest: Effort normal. She has wheezes.  Abdominal: Soft. Bowel sounds are normal.  Musculoskeletal: She exhibits edema. She exhibits no tenderness.  BLE edema, 2+  Lymphadenopathy:    She has no cervical adenopathy.  Neurological: She is alert and oriented to person, place, and time.  Skin: Skin is warm and dry.  Psychiatric: She has a normal mood and affect. Her behavior is normal. Judgment and thought content normal.    LABS/PROCEDURES:  4/14  Wbc 4.2  hgb 10.4  hct 31.4  Bmp nl except glucose 128  Calcium 8.3 07/27/12   Wbc 4.4  hgb 12.2  hct 36.6  Bmp nl 08/01/12  Wbc 3.8  hgb 11.1  hct 33.3  NA 142  K 4.3  Glucose 94  BUN 21  Creatinine 0.76 08/06/12  BNP 78.8 09/04/12  Chest x-ray shows patchy bibasilar atelectasis or pneumonitis; minimal cardiomegaly w/o pulmonary vascular congestion; no pleural effusion    Medications reviewed per [facility]        Assessment & Plan:   Pneumonitis - start Avelox 400 mg 1 tab PO Q D x 7 days

## 2012-09-20 ENCOUNTER — Encounter: Payer: Self-pay | Admitting: Adult Health

## 2012-09-20 ENCOUNTER — Non-Acute Institutional Stay (SKILLED_NURSING_FACILITY): Payer: Medicare Other | Admitting: Adult Health

## 2012-09-20 DIAGNOSIS — H409 Unspecified glaucoma: Secondary | ICD-10-CM

## 2012-09-20 DIAGNOSIS — G894 Chronic pain syndrome: Secondary | ICD-10-CM

## 2012-09-20 DIAGNOSIS — K5900 Constipation, unspecified: Secondary | ICD-10-CM

## 2012-09-20 DIAGNOSIS — J302 Other seasonal allergic rhinitis: Secondary | ICD-10-CM

## 2012-09-20 DIAGNOSIS — G4700 Insomnia, unspecified: Secondary | ICD-10-CM

## 2012-09-20 DIAGNOSIS — F411 Generalized anxiety disorder: Secondary | ICD-10-CM

## 2012-09-20 DIAGNOSIS — R609 Edema, unspecified: Secondary | ICD-10-CM

## 2012-09-20 DIAGNOSIS — F3289 Other specified depressive episodes: Secondary | ICD-10-CM

## 2012-09-20 DIAGNOSIS — R600 Localized edema: Secondary | ICD-10-CM

## 2012-09-20 DIAGNOSIS — I10 Essential (primary) hypertension: Secondary | ICD-10-CM

## 2012-09-20 DIAGNOSIS — F329 Major depressive disorder, single episode, unspecified: Secondary | ICD-10-CM

## 2012-09-20 DIAGNOSIS — J309 Allergic rhinitis, unspecified: Secondary | ICD-10-CM

## 2012-09-20 DIAGNOSIS — J45909 Unspecified asthma, uncomplicated: Secondary | ICD-10-CM

## 2012-09-20 DIAGNOSIS — K219 Gastro-esophageal reflux disease without esophagitis: Secondary | ICD-10-CM

## 2012-09-20 DIAGNOSIS — M62838 Other muscle spasm: Secondary | ICD-10-CM

## 2012-09-20 DIAGNOSIS — E785 Hyperlipidemia, unspecified: Secondary | ICD-10-CM

## 2012-09-20 NOTE — Progress Notes (Signed)
  Subjective:    Patient ID: [name], female    DOB: 10/26/1945, 67 y.o.   MRN: [id number]  HPI This is a 67 year old female who is for discharge home with Home health PT, OT and Nursing. She has been admitted to [facility] on 06/23/12 from [facility] with Left Femur fracture S/P Intramedullary nail femoral. Patient has completed SNF rehabilitation and therapy has cleared the patient for discharge.   Review of Systems  Constitutional: Negative.   HENT: Negative.   Eyes: Negative.   Respiratory: Negative for cough, shortness of breath and wheezing.   Cardiovascular: Positive for leg swelling.  Gastrointestinal: Negative for abdominal distention.  Endocrine: Negative.   Genitourinary: Negative.   Neurological: Negative.   Hematological: Negative for adenopathy. Does not bruise/bleed easily.  Psychiatric/Behavioral: Negative.        Objective:   Physical Exam  Nursing note and vitals reviewed. Constitutional: She is oriented to person, place, and time. She appears well-developed and well-nourished.  HENT:  Head: Normocephalic and atraumatic.  Right Ear: External ear normal.  Left Ear: External ear normal.  Nose: Nose normal.  Mouth/Throat: Oropharynx is clear and moist.  Eyes: Conjunctivae and EOM are normal. Pupils are equal, round, and reactive to light.  Neck: Normal range of motion. Neck supple.  Cardiovascular: Normal rate, regular rhythm, normal heart sounds and intact distal pulses.   Pulmonary/Chest: Effort normal and breath sounds normal. No respiratory distress. She has no wheezes.  Abdominal: Soft. Bowel sounds are normal. She exhibits no distension.  Musculoskeletal: Normal range of motion. She exhibits edema. She exhibits no tenderness.  BLE edema, 2+  Neurological: She is alert and oriented to person, place, and time.  Skin: Skin is warm and dry.  Psychiatric: She has a normal mood and affect. Her behavior is normal. Judgment and thought content  normal.    LABS/PROCEDURES: 09/13/12  Chest x-ray shows mild pulmonary vascular congestion 09/10/12  Cholesterol 129  HDL 53  Triglyceride 75  LDL 61  Liver profile nl except alk phos 132  Albumin 3.3 09/04/12  Chest x-ray minimal cardiomegaly without pulmonary vascular cogestion; patchy bibasilar pneumonitis 08/06/12  bnp 78.8 08/01/12  Wbc 3.8  hgb 11.1  hct 33.3  NA 142  K 4.3  Glucose 94  BUN 21  Creatinine 0.76  Calcium 9.2 07/27/12  Wbc 4.4  hgb 12.2  hct 36.6  NA 142  K 4.0  Glucose 105  BUN 17  Creatinine 0.84    Medications reviewed.    Assessment & Plan:   Constipation - stable  Insomnia, unspecified - stable  Bilateral lower extremity edema - stable  Chronic pain syndrome - stable  Essential hypertension, benign - well-controlled  Generalized anxiety disorder - stable  Other and unspecified hyperlipidemia - stable  Muscle spasm - stable  CHF (congestive heart failure) - stable  Acute blood loss anemia - stable  Femur fracture, left S/P IM nail - for Home health PT, OT and Nursing  Asthma, chronic - stable  GERD (gastroesophageal reflux disease) - stable  Glaucoma - stable     Total Discharge Time:  >30 minutes Discharge time involved coordination of the discharge process with social worker, nursing staff and therapy department. Medical justification for Home health services verified.    CPT CODE:  78295

## 2012-10-16 ENCOUNTER — Telehealth: Payer: Self-pay | Admitting: Neurology

## 2012-10-16 NOTE — Telephone Encounter (Signed)
6 refills were already authorized for both of these medications on 07/20/2012 by Kermit Balo, DO.  I called the patient.  Advised her of this.  She was in rehab for a broken bone and did not recall the Rx's being called in.  She said she will contact the pharmacy.  She inquired about getting a sooner appt.  I told her it appears a message was sent to the Triage Dept, and someone should be contacting her regarding this.  She verbalized understanding.

## 2012-10-17 NOTE — Telephone Encounter (Signed)
[name] is noted. I did not called patient.

## 2012-10-23 ENCOUNTER — Other Ambulatory Visit: Payer: Self-pay | Admitting: Adult Health

## 2012-10-24 ENCOUNTER — Telehealth: Payer: Self-pay | Admitting: Neurology

## 2012-10-24 ENCOUNTER — Encounter: Payer: Self-pay | Admitting: Neurology

## 2012-10-24 ENCOUNTER — Other Ambulatory Visit: Payer: Self-pay | Admitting: Geriatric Medicine

## 2012-10-24 DIAGNOSIS — G252 Other specified forms of tremor: Secondary | ICD-10-CM

## 2012-10-24 DIAGNOSIS — R209 Unspecified disturbances of skin sensation: Secondary | ICD-10-CM

## 2012-10-24 DIAGNOSIS — G250 Essential tremor: Secondary | ICD-10-CM

## 2012-10-24 NOTE — Telephone Encounter (Signed)
I called and spoke with the patient and she is scheduled for 8-7-@7 :45.

## 2012-10-25 ENCOUNTER — Encounter: Payer: Self-pay | Admitting: Neurology

## 2012-10-25 ENCOUNTER — Ambulatory Visit (INDEPENDENT_AMBULATORY_CARE_PROVIDER_SITE_OTHER): Payer: Medicare Other | Admitting: Neurology

## 2012-10-25 ENCOUNTER — Telehealth: Payer: Self-pay | Admitting: Neurology

## 2012-10-25 VITALS — BP 138/67 | HR 61 | Ht 60.0 in | Wt 138.0 lb

## 2012-10-25 DIAGNOSIS — R269 Unspecified abnormalities of gait and mobility: Secondary | ICD-10-CM

## 2012-10-25 DIAGNOSIS — G63 Polyneuropathy in diseases classified elsewhere: Secondary | ICD-10-CM

## 2012-10-25 DIAGNOSIS — G252 Other specified forms of tremor: Secondary | ICD-10-CM

## 2012-10-25 HISTORY — DX: Unspecified abnormalities of gait and mobility: R26.9

## 2012-10-25 HISTORY — DX: Polyneuropathy in diseases classified elsewhere: G63

## 2012-10-25 MED ORDER — PREGABALIN 100 MG PO CAPS: ORAL_CAPSULE | ORAL | Status: DC

## 2012-10-25 MED ORDER — CLONAZEPAM 0.5 MG PO TABS: ORAL_TABLET | ORAL | Status: DC

## 2012-10-25 NOTE — Telephone Encounter (Signed)
Unable to put in another open encounter.  Also received a message from Pharmacist at [facility] in [place] on Lyrica prescription.  Directions say one cap every morning and 4 capsules every morning, pharmacist just needs clarification.  [phone]
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
## 2012-10-25 NOTE — Telephone Encounter (Signed)
I called the pharmacy. There was an error on the prescription. The patient is taking 1 Lyrica tablet in the morning, 4 tablets in the evening.

## 2012-11-13 ENCOUNTER — Telehealth: Payer: Self-pay | Admitting: Neurology

## 2012-11-13 MED ORDER — HYDROCODONE-ACETAMINOPHEN 5-325 MG PO TABS: 1.0000 | ORAL_TABLET | Freq: Four times a day (QID) | ORAL | Status: DC | PRN

## 2012-11-13 NOTE — Telephone Encounter (Signed)
The patient indicates that she is on Ultram, I have no record of this. The patient apparently lives given hydrocodone 7.5 mg with 120 tablets and 5 refills in June of 2014. The patient denies that she ever got this prescription. I will call in a 5 mg hydrocodone tablets, with 60 tablets a month. Must last 28 days.

## 2012-11-15 ENCOUNTER — Telehealth: Payer: Self-pay | Admitting: Neurology

## 2012-11-20 NOTE — Telephone Encounter (Signed)
I called patient. The patient wanted to apologize for being rude, I don't recall that being the case when I talked with her.

## 2012-11-23 ENCOUNTER — Other Ambulatory Visit: Payer: Self-pay | Admitting: Neurology

## 2012-11-23 MED ORDER — CLONAZEPAM 0.5 MG PO TABS: 0.5000 mg | ORAL_TABLET | Freq: Two times a day (BID) | ORAL | Status: DC

## 2012-11-23 MED ORDER — BACLOFEN 10 MG PO TABS: 10.0000 mg | ORAL_TABLET | Freq: Every day | ORAL | Status: DC

## 2012-11-23 MED ORDER — PREGABALIN 100 MG PO CAPS: ORAL_CAPSULE | ORAL | Status: DC

## 2012-11-23 NOTE — Telephone Encounter (Signed)
Rx's signed and faxed.

## 2013-01-14 ENCOUNTER — Telehealth: Payer: Self-pay | Admitting: Neurology

## 2013-01-15 ENCOUNTER — Other Ambulatory Visit: Payer: Self-pay | Admitting: Neurology

## 2013-01-15 MED ORDER — HYDROCODONE-ACETAMINOPHEN 5-325 MG PO TABS: 1.0000 | ORAL_TABLET | Freq: Four times a day (QID) | ORAL | Status: DC | PRN

## 2013-01-15 NOTE — Telephone Encounter (Signed)
Patient needs a refill on Hydrocodone.  She does not take Oxycodone.

## 2013-01-16 NOTE — Telephone Encounter (Signed)
I called patient and let her know her prescription is ready for pick up. She asked that we mail it to her. I will put it in the mail today.

## 2013-01-16 NOTE — Telephone Encounter (Signed)
Script mailed 

## 2013-02-09 IMAGING — RF DG THORACOLUMBAR SPINE 2V
1 series · 4 of 4 positions shown · non-contrast
Comparison: MRI thoracolumbar spine is 08/22/2011

CLINICAL DATA: Back pain

DG C-ARM 61-120 MIN,THORACOLUMBAR SPINE - 2 VIEW

[Series 1: run · 4 of 4 slices shown]
[im 1/4]
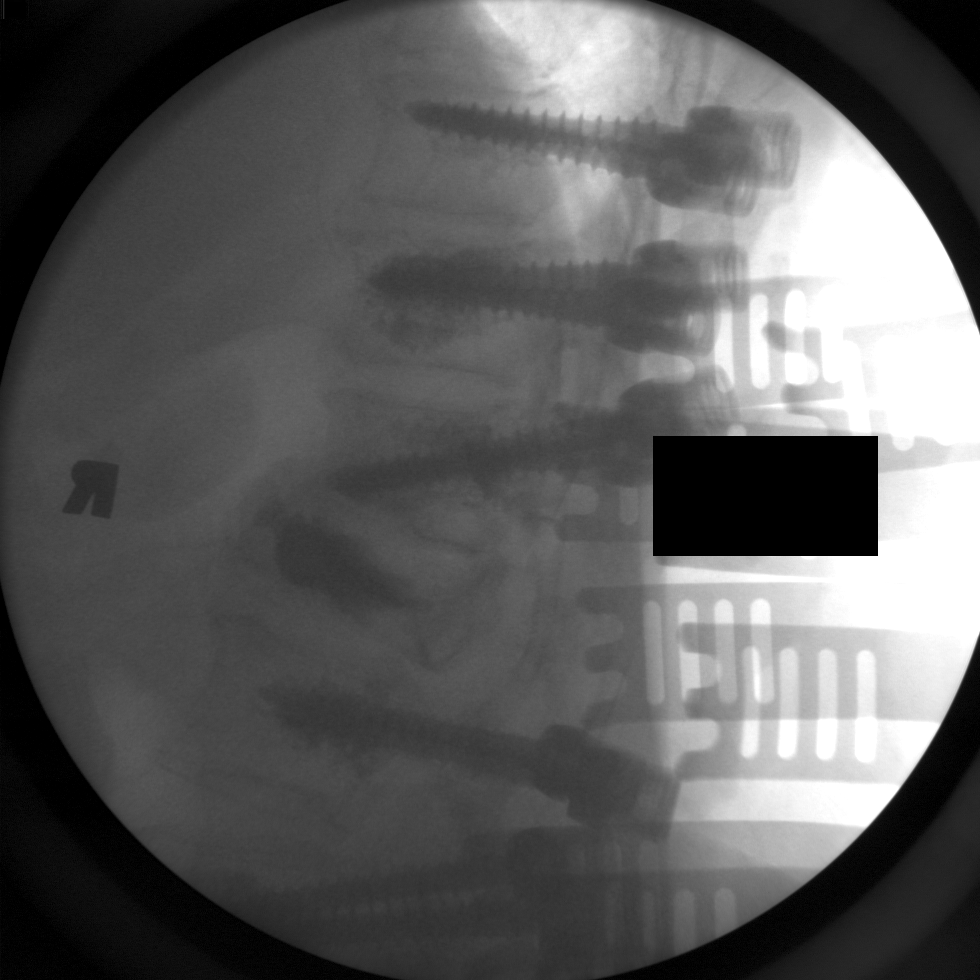
[im 2/4]
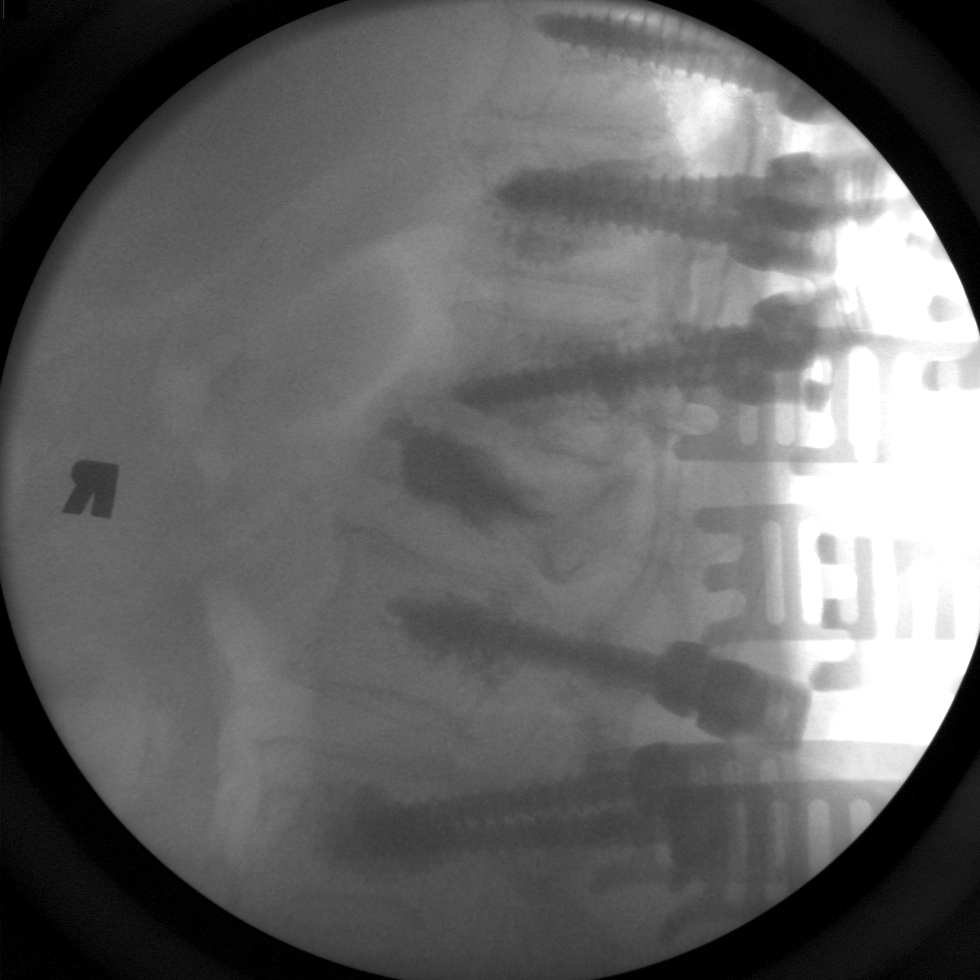
[im 3/4]
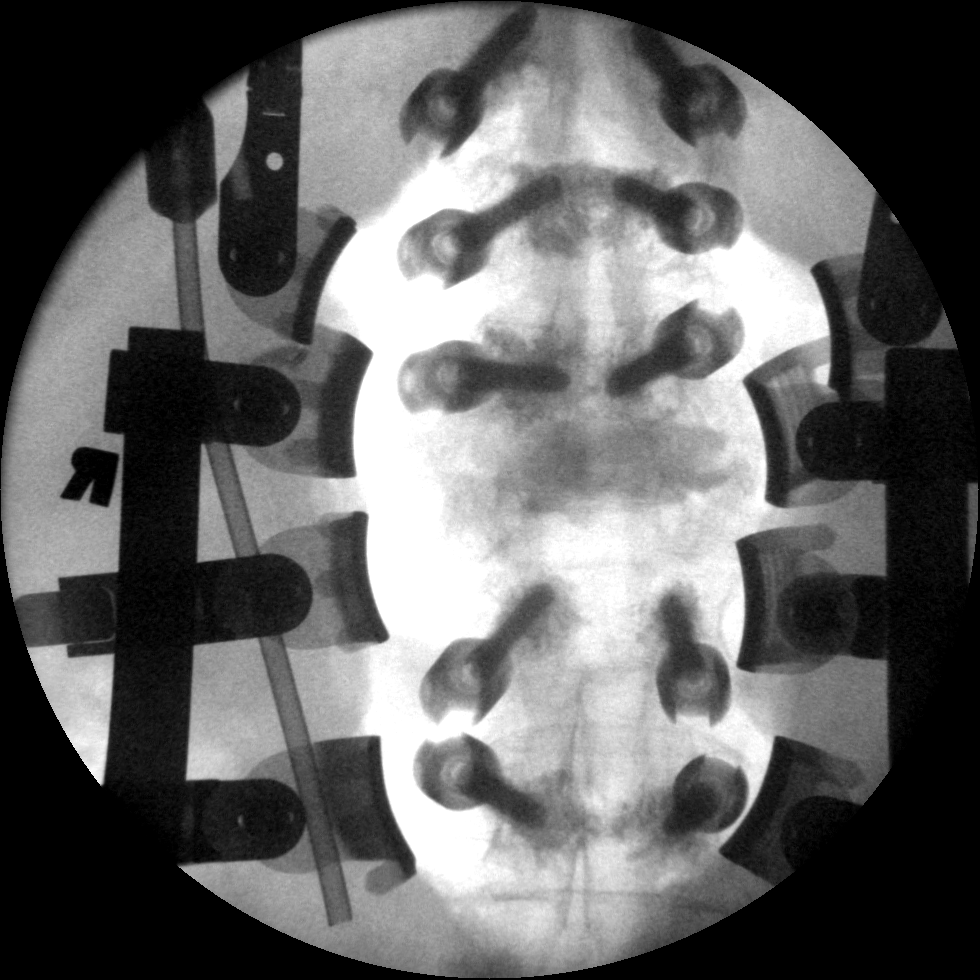
[im 4/4]
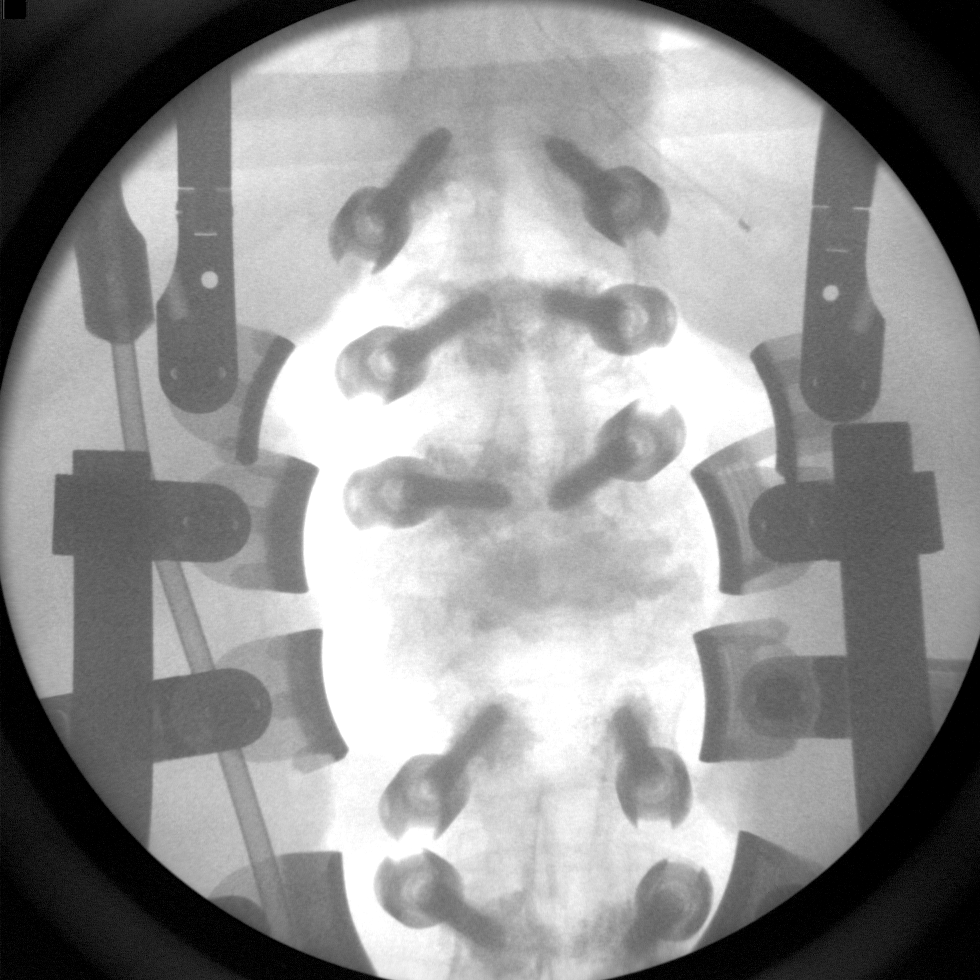

[4 of 4 positions shown; findings below may reference images not displayed]

FINDINGS: C-arm films document bilateral T10 through T12, as well
as L2 and L3 bilateral placement of pedicle screws for
posterolateral fusion. Prior kyphoplasty at L1.
IMPRESSION: As above.

## 2013-04-29 ENCOUNTER — Ambulatory Visit: Payer: Medicare Other | Admitting: Nurse Practitioner

## 2013-05-08 ENCOUNTER — Other Ambulatory Visit: Payer: Self-pay | Admitting: Neurology

## 2013-05-08 MED ORDER — PREGABALIN 100 MG PO CAPS: ORAL_CAPSULE | ORAL | Status: DC

## 2013-05-08 NOTE — Telephone Encounter (Signed)
Pt needs a refill on her Lyrica.  She has an appointment scheduled in March.  Please send to [facility].  Thank you

## 2013-05-08 NOTE — Telephone Encounter (Signed)
Rx signed and faxed.

## 2013-05-09 ENCOUNTER — Ambulatory Visit: Payer: Medicare Other | Admitting: Nurse Practitioner

## 2013-05-27 ENCOUNTER — Ambulatory Visit (INDEPENDENT_AMBULATORY_CARE_PROVIDER_SITE_OTHER): Payer: Medicare Other | Admitting: Nurse Practitioner

## 2013-05-27 ENCOUNTER — Encounter (INDEPENDENT_AMBULATORY_CARE_PROVIDER_SITE_OTHER): Payer: Self-pay

## 2013-05-27 ENCOUNTER — Encounter: Payer: Self-pay | Admitting: Nurse Practitioner

## 2013-05-27 VITALS — BP 136/58 | HR 40 | Ht 60.0 in | Wt 196.0 lb

## 2013-05-27 DIAGNOSIS — R269 Unspecified abnormalities of gait and mobility: Secondary | ICD-10-CM

## 2013-05-27 DIAGNOSIS — G252 Other specified forms of tremor: Secondary | ICD-10-CM

## 2013-05-27 DIAGNOSIS — G63 Polyneuropathy in diseases classified elsewhere: Secondary | ICD-10-CM

## 2013-05-27 DIAGNOSIS — R209 Unspecified disturbances of skin sensation: Secondary | ICD-10-CM

## 2013-05-27 DIAGNOSIS — G250 Essential tremor: Secondary | ICD-10-CM

## 2013-05-27 MED ORDER — PREGABALIN 100 MG PO CAPS: ORAL_CAPSULE | ORAL | Status: DC

## 2013-05-27 MED ORDER — CLONAZEPAM 0.5 MG PO TABS: 0.5000 mg | ORAL_TABLET | Freq: Two times a day (BID) | ORAL | Status: DC

## 2013-05-27 MED ORDER — BACLOFEN 10 MG PO TABS: 10.0000 mg | ORAL_TABLET | Freq: Every day | ORAL | Status: DC

## 2013-05-27 NOTE — Progress Notes (Signed)
I have read the note, and I agree with the clinical assessment and plan.  [name],[name]   

## 2013-05-27 NOTE — Progress Notes (Signed)
[facility]  PATIENT: [name] DOB: 1945/10/31   REASON FOR VISIT: gait abnormality, neuropathy and essential tremor   HISTORY OF PRESENT ILLNESS:[name], 68 year old female returns for followup. She was last seen by Dr. Anne Hahn 10/25/2012. She has a history of neuropathy, gait abnormality and essential tremor. Her neuropathy is well-controlled with Lyrica and her essential tremor is well controlled with clonazepam. She uses a walker to ambulate. She continues to live alone  and is independent with her activities of daily living. She returns for reevaluation.    HISTORY:of obesity and a prior L1 compression fracture with some compromise of the spinal cord. The patient has had a chronic gait disorder. The patient indicates that on 06/19/2012, she stooped over to pick up a brick, and apparently blacked out. The patient fractured her left femur, and this required surgery. The patient has spent several months in a rehabilitation facility, but she is now back home. The patient is walking with a walker, and she has ongoing discomfort in the left hip when she stands up. The patient indicates that her peripheral neuropathy discomfort is still present, but the Lyrica in the evening allows her to rest. The patient has asthma, and she has significant shortness of breath with minimal activity. The patient returns for an evaluation. The patient indicates that the clonazepam does help her tremors.   REVIEW OF SYSTEMS: Full 14 system review of systems performed and notable only for those listed, all others are neg:  Constitutional: N/A  Cardiovascular: N/A  Ear/Nose/Throat: Hearing loss  Skin: N/A  Eyes: N/A  Respiratory: Wheezing, cough Gastroitestinal: Urinary frequency  Hematology/Lymphatic: N/A  Endocrine: N/A Musculoskeletal: Joint pain, walking difficulty, back pain Allergy/Immunology: Environmental allergies, food allergies  Neurological: N/A Psychiatric: Depression  anxiety  ALLERGIES: Allergies  Allergen Reactions  . Latex Hives  . Peanuts [Peanut Oil] Anaphylaxis and Swelling  . Sulfa Antibiotics Shortness Of Breath  . Neosporin [Neomycin-Polymyxin-Gramicidin] Rash  . Penicillins Rash    HOME MEDICATIONS: Outpatient Prescriptions Prior to Visit  Medication Sig Dispense Refill  . atorvastatin (LIPITOR) 20 MG tablet Take 20 mg by mouth every evening.       . baclofen (LIORESAL) 10 MG tablet Take 1 tablet (10 mg total) by mouth at bedtime.  90 tablet  1  . bimatoprost (LUMIGAN) 0.01 % SOLN Place 1 drop into both eyes 2 (two) times daily.       . brinzolamide (AZOPT) 1 % ophthalmic suspension Place 1 drop into both eyes 2 (two) times daily.        . budesonide-formoterol (SYMBICORT) 160-4.5 MCG/ACT inhaler Inhale 1-2 puffs into the lungs 2 (two) times daily. 2 puffs every morning and 1 puff every evening      . bumetanide (BUMEX) 2 MG tablet Take 4 mg by mouth daily as needed. For swelling      . cetirizine (ZYRTEC) 10 MG tablet Take 10 mg by mouth daily.      . clonazePAM (KLONOPIN) 0.5 MG tablet Take 1 tablet (0.5 mg total) by mouth 2 (two) times daily.  180 tablet  1  . dexlansoprazole (DEXILANT) 60 MG capsule Take 60 mg by mouth every evening.       Marland Kitchen FLUoxetine (PROZAC) 20 MG tablet Take 40 mg by mouth every morning. PT TAKES 2 CAPS FOR 40MG  DOSAGE      . fluticasone (FLONASE) 50 MCG/ACT nasal spray Place 2 sprays into the nose 2 (two) times daily as needed.       Marland Kitchen  lamoTRIgine (LAMICTAL) 150 MG tablet Take 75-150 mg by mouth 2 (two) times daily. Take 150 mg every morning and 75 mg every evening      . levalbuterol (XOPENEX HFA) 45 MCG/ACT inhaler Inhale 1-2 puffs into the lungs every 4 (four) hours as needed. For shortness of breath      . methocarbamol (ROBAXIN) 500 MG tablet Take 500 mg by mouth every 8 (eight) hours as needed. SPASMS      . nisoldipine (SULAR) 17 MG 24 hr tablet Take 17 mg by mouth every morning.        . potassium chloride  20 MEQ/15ML (10%) SOLN Take 40 mEq by mouth every evening. Mixed in water. Takes after evening meal so it won't burn stomach      . pregabalin (LYRICA) 100 MG capsule Take 1 capsule every morning. Take 4 capsules every evening  450 capsule  1  . traZODone (DESYREL) 100 MG tablet Take 100 mg by mouth at bedtime.      Marland Kitchen HYDROcodone-acetaminophen (NORCO/VICODIN) 5-325 MG per tablet Take 1 tablet by mouth every 6 (six) hours as needed for pain (Must last 28 days.).  60 tablet  0   No facility-administered medications prior to visit.    PAST MEDICAL HISTORY: Past Medical History  Diagnosis Date  . GERD (gastroesophageal reflux disease)   . Peripheral vascular disease   . Cataracts, bilateral   . Glaucoma   . Asthma   . Depression   . Hypertension   . Arthritis   . Anxiety   . CHF (congestive heart failure) 08/15/2012  . Peripheral neuropathy     Possible small fiber neuropathy  . Dyslipidemia   . Obesity   . Ankle fracture, right   . Degenerative arthritis   . Abnormality of gait 10/25/2012  . Polyneuropathy in other diseases classified elsewhere 10/25/2012  . Compression fracture of L1 lumbar vertebra     PAST SURGICAL HISTORY: Past Surgical History  Procedure Laterality Date  . Back surgery    . Bladder suspension      x3  . Knee arhtroscopy    . Hand / finger lesion excision    . Im nailing femoral shaft fracture Right 06/20/2012    Dr Sunnie Nielsen  . Femur im nail Left 06/20/2012    Procedure: INTRAMEDULLARY (IM) NAIL FEMORAL;  Surgeon: Verlee Rossetti, MD;  Location: [facility];  Service: Orthopedics;  Laterality: Left;  . Tonsillectomy    . Abdominal hysterectomy    . Cholecystectomy    . Cataract extraction Bilateral   . Extensor tendon of forearm / wrist repair    . Kyphoplasty    . Right forearm incision and drainage    . Umbilical hernia repair    . Arthroscopic surgery      Left knee    FAMILY HISTORY: Family History  Problem Relation Age of Onset  . Diabetes Maternal  Aunt   . Parkinsonism Cousin   . Heart disease Mother   . Stroke Mother   . Cancer Father     SOCIAL HISTORY: History   Social History  . Marital Status: Divorced    Spouse Name: N/A    Number of Children: 1  . Years of Education: 16   Occupational History  . Not on file.   Social History Main Topics  . Smoking status: Never Smoker   . Smokeless tobacco: Never Used  . Alcohol Use: 0.6 oz/week    1 Glasses of wine per week  Comment: daily  . Drug Use: No  . Sexual Activity: Not on file   Other Topics Concern  . Not on file   Social History Narrative   Patient lives at home with her cat.    Patient is divorced.    Patient has 1 child.    Patient is retired.    Patient has a college education.      PHYSICAL EXAM  Filed Vitals:   05/27/13 0906  BP: 136/58  Pulse: 40  Height: 5' (1.524 m)  Weight: 196 lb (88.905 kg)   Body mass index is 38.28 kg/(m^2).  Generalized: Well developed, obese female in no acute distress  Skin  2+plus edema below the knees bilaterally  Neurological examination   Mentation: Alert oriented to time, place, history taking. Follows all commands speech and language fluent  Cranial nerve II-XII: Pupils were equal round reactive to light extraocular movements were full, visual fields were full on confrontational test. Facial sensation and strength were normal. Hard of hearing. Uvula tongue midline. head turning and shoulder shrug were normal and symmetric.Tongue protrusion into cheek strength was normal. Motor: normal bulk and tone, full strength in the BUE, BLE, No focal weakness Sensory: normal and symmetric to  pinprick, and  vibration  Coordination: finger-nose-finger, heel-to-shin bilaterally, no dysmetria Reflexes: Symmetric upper and lower and mildly brisk throughout Gait and Station: Rising up from seated position without assistance, wide based stance,  Unsteady gait. Uses a rolling walker, tandem gait not attempted, Romberg  negative  DIAGNOSTIC DATA (LABS, IMAGING, TESTING) - I reviewed patient records, labs, notes, testing and imaging myself where available.  Lab Results  Component Value Date   WBC 5.6 06/23/2012   HGB 10.8* 06/23/2012   HCT 31.3* 06/23/2012   MCV 85.3 06/23/2012   PLT 81* 06/23/2012      Component Value Date/Time   NA 136 06/23/2012 0811   K 3.7 06/23/2012 0811   CL 102 06/23/2012 0811   CO2 28 06/23/2012 0811   GLUCOSE 184* 06/23/2012 0811   BUN 14 06/23/2012 0811   CREATININE 0.70 06/23/2012 0811   CALCIUM 8.7 06/23/2012 0811   PROT 6.6 03/06/2012 2322   ALBUMIN 3.7 03/06/2012 2322   AST 14 03/06/2012 2322   ALT 11 03/06/2012 2322   ALKPHOS 123* 03/06/2012 2322   BILITOT 0.3 03/06/2012 2322   GFRNONAA 87* 06/23/2012 0811   GFRAA >90 06/23/2012 0811     ASSESSMENT AND PLAN  68 y.o. year old female  has a past medical history of small fiber peripheral neuropathy, gait disorder and essential tremor here for followup.   Continue clonazepam for essential tremor will refill Continue Lyrica at current dose will refill Continue baclofen at current dose will refill Followup in 6 months Nilda RiggsNancy Carolyn Martin, [facility] CentersGNP, [facility], APRN  [facility] [address], [address] [place], [place] [address] [phone]

## 2013-05-27 NOTE — Patient Instructions (Signed)
Continue clonazepam for essential tremor will refill Continue Lyrica at current dose will refill Continue baclofen at current dose will refill Followup in 6 months

## 2013-10-15 ENCOUNTER — Telehealth: Payer: Self-pay | Admitting: Nurse Practitioner

## 2013-10-15 NOTE — Telephone Encounter (Signed)
Pt's medication was left at the front desk for pt to pick up. °

## 2013-10-15 NOTE — Telephone Encounter (Signed)
Yes can Olegario MessierKathy do that

## 2013-10-15 NOTE — Telephone Encounter (Signed)
Patient states she has lost or thrown away her Lyrica Rx and would like to know if we will provide her enough samples to last until next Tuesday.   Okay to provide samples?  Please advise.  Thank you.

## 2013-10-15 NOTE — Telephone Encounter (Signed)
I called the patient back twice.  The phone rang over 20 times with no answer, and no VM each time I called.

## 2013-10-15 NOTE — Telephone Encounter (Signed)
Patient calling to state that she received her order of Lyrica from her pharmacy but has misplaced it or thrown it away--patient does not have enough pills to last her until they can send her another one next Tuesday. Patient wants to know if we have any samples for her to use until then. Please return call to patient and advise.

## 2013-11-15 ENCOUNTER — Other Ambulatory Visit: Payer: Self-pay

## 2013-11-15 MED ORDER — PREGABALIN 100 MG PO CAPS: ORAL_CAPSULE | ORAL | Status: DC

## 2013-11-15 NOTE — Telephone Encounter (Signed)
Rx signed and faxed.

## 2013-11-28 ENCOUNTER — Ambulatory Visit: Payer: Medicare Other | Admitting: Nurse Practitioner

## 2013-12-04 ENCOUNTER — Other Ambulatory Visit: Payer: Self-pay

## 2013-12-04 MED ORDER — CLONAZEPAM 0.5 MG PO TABS: 0.5000 mg | ORAL_TABLET | Freq: Two times a day (BID) | ORAL | Status: DC

## 2013-12-18 ENCOUNTER — Encounter (INDEPENDENT_AMBULATORY_CARE_PROVIDER_SITE_OTHER): Payer: Self-pay

## 2013-12-18 ENCOUNTER — Encounter: Payer: Self-pay | Admitting: Nurse Practitioner

## 2013-12-18 ENCOUNTER — Ambulatory Visit (INDEPENDENT_AMBULATORY_CARE_PROVIDER_SITE_OTHER): Payer: Medicare Other | Admitting: Nurse Practitioner

## 2013-12-18 VITALS — BP 135/63 | HR 48 | Ht 60.0 in | Wt 198.6 lb

## 2013-12-18 DIAGNOSIS — G250 Essential tremor: Secondary | ICD-10-CM

## 2013-12-18 DIAGNOSIS — R269 Unspecified abnormalities of gait and mobility: Secondary | ICD-10-CM

## 2013-12-18 DIAGNOSIS — G63 Polyneuropathy in diseases classified elsewhere: Secondary | ICD-10-CM

## 2013-12-18 DIAGNOSIS — G252 Other specified forms of tremor: Secondary | ICD-10-CM

## 2013-12-18 MED ORDER — CLONAZEPAM 0.5 MG PO TABS
0.5000 mg | ORAL_TABLET | Freq: Two times a day (BID) | ORAL | Status: DC
Start: 2013-12-18 — End: 2014-05-27

## 2013-12-18 MED ORDER — BACLOFEN 10 MG PO TABS: 10.0000 mg | ORAL_TABLET | Freq: Every day | ORAL | Status: DC

## 2013-12-18 MED ORDER — PREGABALIN 100 MG PO CAPS: ORAL_CAPSULE | ORAL | Status: DC

## 2013-12-18 NOTE — Progress Notes (Signed)
[facility]  PATIENT: [name] DOB: [birth year]   REASON FOR VISIT: follow up for polyneuropathy   HISTORY OF PRESENT ILLNESS:[name], 68 -year-old female returns for followup. She was last seen 05/27/13. She has a history of neuropathy, gait abnormality and essential tremor. Her neuropathy is well-controlled with Lyrica and her essential tremor is well controlled with clonazepam. She uses a walker to ambulate. She continues to live alone and is independent with her activities of daily living. She has no new neurologic issues. She returns for reevaluation.   HISTORY:of obesity and a prior L1 compression fracture with some compromise of the spinal cord. The patient has had a chronic gait disorder. The patient indicates that on 06/19/2012, she stooped over to pick up a brick, and apparently blacked out. The patient fractured her left femur, and this required surgery. The patient has spent several months in a rehabilitation facility, but she is now back home. The patient is walking with a walker, and she has ongoing discomfort in the left hip when she stands up. The patient indicates that her peripheral neuropathy discomfort is still present, but the Lyrica in the evening allows her to rest. The patient has asthma, and she has significant shortness of breath with minimal activity. The patient returns for an evaluation. The patient indicates that the clonazepam does help her tremors.   REVIEW OF SYSTEMS: Full 14 system review of systems performed and notable only for those listed, all others are neg:  Constitutional: N/A  Cardiovascular: leg swelling  Ear/Nose/Throat: N/A  Skin: N/A  Eyes: N/A  Respiratory: shortness of breath Gastroitestinal: N/A  Hematology/Lymphatic: easy bruising Endocrine: N/A Musculoskeletal:joint pain, back pain Allergy/Immunology: N/A  Neurological: N/A Psychiatric: depression, anxiety Sleep : NA   ALLERGIES: Allergies  Allergen  Reactions  . Latex Hives  . Peanuts [Peanut Oil] Anaphylaxis and Swelling  . Sulfa Antibiotics Shortness Of Breath  . Neosporin [Neomycin-Polymyxin-Gramicidin] Rash  . Penicillins Rash    HOME MEDICATIONS: Outpatient Prescriptions Prior to Visit  Medication Sig Dispense Refill  . atorvastatin (LIPITOR) 20 MG tablet Take 20 mg by mouth every evening.       . baclofen (LIORESAL) 10 MG tablet Take 1 tablet (10 mg total) by mouth at bedtime.  90 tablet  1  . bimatoprost (LUMIGAN) 0.01 % SOLN Place 1 drop into both eyes 2 (two) times daily.       . brinzolamide (AZOPT) 1 % ophthalmic suspension Place 1 drop into both eyes 2 (two) times daily.        . budesonide-formoterol (SYMBICORT) 160-4.5 MCG/ACT inhaler Inhale 1-2 puffs into the lungs 2 (two) times daily. 2 puffs every morning and 1 puff every evening      . bumetanide (BUMEX) 2 MG tablet Take 4 mg by mouth daily as needed. For swelling      . cetirizine (ZYRTEC) 10 MG tablet Take 10 mg by mouth daily.      . clonazePAM (KLONOPIN) 0.5 MG tablet Take 1 tablet (0.5 mg total) by mouth 2 (two) times daily.  180 tablet  0  . dexlansoprazole (DEXILANT) 60 MG capsule Take 60 mg by mouth every evening.       Marland Kitchen FLUoxetine (PROZAC) 20 MG tablet Take 40 mg by mouth every morning. PT TAKES 2 CAPS FOR 40MG  DOSAGE      . fluticasone (FLONASE) 50 MCG/ACT nasal spray Place 2 sprays into the nose 2 (two) times daily as needed.       Marland Kitchen  lamoTRIgine (LAMICTAL) 150 MG tablet Take 75-150 mg by mouth 2 (two) times daily. Take 150 mg every morning and 75 mg every evening      . levalbuterol (XOPENEX HFA) 45 MCG/ACT inhaler Inhale 1-2 puffs into the lungs every 4 (four) hours as needed. For shortness of breath      . lisinopril (PRINIVIL,ZESTRIL) 5 MG tablet       . methocarbamol (ROBAXIN) 500 MG tablet Take 500 mg by mouth every 8 (eight) hours as needed. SPASMS      . nisoldipine (SULAR) 17 MG 24 hr tablet Take 17 mg by mouth every morning.        Marland Kitchen oxybutynin  (DITROPAN) 5 MG tablet       . potassium chloride 20 MEQ/15ML (10%) SOLN Take 40 mEq by mouth every evening. Mixed in water. Takes after evening meal so it won't burn stomach      . pregabalin (LYRICA) 100 MG capsule Take 1 capsule every morning. Take 4 capsules every evening  360 capsule  1  . traZODone (DESYREL) 100 MG tablet Take 100 mg by mouth at bedtime.       No facility-administered medications prior to visit.    PAST MEDICAL HISTORY: Past Medical History  Diagnosis Date  . GERD (gastroesophageal reflux disease)   . Peripheral vascular disease   . Cataracts, bilateral   . Glaucoma   . Asthma   . Depression   . Hypertension   . Arthritis   . Anxiety   . CHF (congestive heart failure) 08/15/2012  . Peripheral neuropathy     Possible small fiber neuropathy  . Dyslipidemia   . Obesity   . Ankle fracture, right   . Degenerative arthritis   . Abnormality of gait 10/25/2012  . Polyneuropathy in other diseases classified elsewhere 10/25/2012  . Compression fracture of L1 lumbar vertebra     PAST SURGICAL HISTORY: Past Surgical History  Procedure Laterality Date  . Back surgery    . Bladder suspension      x3  . Knee arhtroscopy    . Hand / finger lesion excision    . Im nailing femoral shaft fracture Right 06/20/2012    Dr Sunnie Nielsen  . Femur im nail Left 06/20/2012    Procedure: INTRAMEDULLARY (IM) NAIL FEMORAL;  Surgeon: Verlee Rossetti, MD;  Location: [facility] OR;  Service: Orthopedics;  Laterality: Left;  . Tonsillectomy    . Abdominal hysterectomy    . Cholecystectomy    . Cataract extraction Bilateral   . Extensor tendon of forearm / wrist repair    . Kyphoplasty    . Right forearm incision and drainage    . Umbilical hernia repair    . Arthroscopic surgery      Left knee    FAMILY HISTORY: Family History  Problem Relation Age of Onset  . Diabetes Maternal Aunt   . Parkinsonism Cousin   . Heart disease Mother   . Stroke Mother   . Cancer Father     SOCIAL  HISTORY: History   Social History  . Marital Status: Divorced    Spouse Name: N/A    Number of Children: 1  . Years of Education: 16   Occupational History  . retired    Social History Main Topics  . Smoking status: Never Smoker   . Smokeless tobacco: Never Used  . Alcohol Use: 0.6 oz/week    1 Glasses of wine per week     Comment: daily  .  Drug Use: No  . Sexual Activity: Not on file   Other Topics Concern  . Not on file   Social History Narrative   Patient lives at home with her cat.    Patient is divorced.    Patient has 1 child.    Patient is retired.    Patient has a college education.      PHYSICAL EXAM  Filed Vitals:   12/18/13 1505  BP: 135/63  Pulse: 48  Height: 5' (1.524 m)  Weight: 198 lb 9.6 oz (90.084 kg)   Body mass index is 38.79 kg/(m^2). Generalized: Well developed, obese female in no acute distress  Skin 2+ edema below the knees bilaterally  Neurological examination  Mentation: Alert oriented to time, place, history taking. Follows all commands speech and language fluent  Cranial nerve II-XII: Pupils were equal round reactive to light extraocular movements were full, visual fields were full on confrontational test. Facial sensation and strength were normal. Hard of hearing. Uvula tongue midline. head turning and shoulder shrug were normal and symmetric.Tongue protrusion into cheek strength was normal.  Motor: normal bulk and tone, full strength in the BUE, BLE, No focal weakness  Sensory: normal and symmetric to pinprick, and vibration  Coordination: finger-nose-finger, heel-to-shin bilaterally, no dysmetria  Reflexes: Symmetric upper and lower and mildly brisk throughout  Gait and Station: Rising up from seated position without assistance, wide based stance, Unsteady gait. Uses a rolling walker, tandem gait not attempted, Romberg negative   DIAGNOSTIC DATA (LABS, IMAGING, TESTING) - I reviewed patient records, labs, notes, testing and imaging  myself where available.  Lab Results  Component Value Date   WBC 5.6 06/23/2012   HGB 10.8* 06/23/2012   HCT 31.3* 06/23/2012   MCV 85.3 06/23/2012   PLT 81* 06/23/2012      Component Value Date/Time   NA 136 06/23/2012 0811   K 3.7 06/23/2012 0811   CL 102 06/23/2012 0811   CO2 28 06/23/2012 0811   GLUCOSE 184* 06/23/2012 0811   BUN 14 06/23/2012 0811   CREATININE 0.70 06/23/2012 0811   CALCIUM 8.7 06/23/2012 0811   PROT 6.6 03/06/2012 2322   ALBUMIN 3.7 03/06/2012 2322   AST 14 03/06/2012 2322   ALT 11 03/06/2012 2322   ALKPHOS 123* 03/06/2012 2322   BILITOT 0.3 03/06/2012 2322   GFRNONAA 87* 06/23/2012 0811   GFRAA >90 06/23/2012 0811    ASSESSMENT AND PLAN  68 y.o. year old female  has a past medical history of small fiber peripheral neuropathy; Dyslipidemia; Obesity; Degenerative arthritis; abnormality of gait  and essential tremor.   Continue Clonazepam for essential tremor, will refill Continue Lyrica will refill Continue Baclofen will refill F/U 6 months Nilda RiggsNancy Carolyn Martin, [facility] CenterGNP, [facility]BC, APRN  [facility] [address], [address] [place], [place] [address] [phone]

## 2013-12-18 NOTE — Progress Notes (Signed)
I have read the note, and I agree with the clinical assessment and plan.  [name],[name]   

## 2013-12-18 NOTE — Patient Instructions (Addendum)
Continue Clonazepam for essential tremor, will refill Continue Lyrica will refill Continue Baclofen will refill F/U 6 months

## 2014-01-29 NOTE — Telephone Encounter (Signed)
Patient never came to pick up Samples of Lyrica 50mg .

## 2014-02-05 ENCOUNTER — Encounter: Payer: Self-pay | Admitting: Neurology

## 2014-02-11 ENCOUNTER — Encounter: Payer: Self-pay | Admitting: Neurology

## 2014-02-27 ENCOUNTER — Telehealth: Payer: Self-pay | Admitting: Nurse Practitioner

## 2014-02-27 NOTE — Telephone Encounter (Signed)
Pt is stating her left heel feels spongy and has needles in it and it is very very painful.  This has been going on for two weeks. Please call back and advise.  Wants to come in. I offered her appointment today @ 11 to see Dr. Anne HahnWillis she states she can't come today she wants to come tomorrow.

## 2014-02-27 NOTE — Telephone Encounter (Signed)
I called the patient and she states that her heel feels like a sponge full of water.  She has neuropathy, but she feels like it's moving to her heel.  It can get painful at times.  The patient wanted to be worked in with Dr. Anne HahnWillis.  I was able to make her an appt for Monday at 1330.

## 2014-03-03 ENCOUNTER — Ambulatory Visit: Payer: Medicare Other | Admitting: Neurology

## 2014-05-06 ENCOUNTER — Telehealth: Payer: Self-pay

## 2014-05-06 NOTE — Telephone Encounter (Signed)
Called patient and left message to call back and rescheduled her apt. With Laupahoehoearolyn on 06-18-14 at 3:00 CM/Willis.  When Patient calls please reschedule her apt. With Limited BrandsCarolyn Thanks Adine Heimann.

## 2014-05-27 ENCOUNTER — Other Ambulatory Visit: Payer: Self-pay | Admitting: Neurology

## 2014-05-27 MED ORDER — PREGABALIN 100 MG PO CAPS: ORAL_CAPSULE | ORAL | Status: DC

## 2014-05-27 MED ORDER — CLONAZEPAM 0.5 MG PO TABS: 0.5000 mg | ORAL_TABLET | Freq: Two times a day (BID) | ORAL | Status: DC

## 2014-05-28 NOTE — Telephone Encounter (Signed)
Rx signed and faxed.

## 2014-06-18 ENCOUNTER — Ambulatory Visit: Payer: Medicare Other | Admitting: Nurse Practitioner

## 2014-06-19 ENCOUNTER — Encounter: Payer: Self-pay | Admitting: Nurse Practitioner

## 2014-09-21 HISTORY — PX: OTHER SURGICAL HISTORY: SHX169

## 2014-10-07 ENCOUNTER — Ambulatory Visit: Payer: Medicare Other | Admitting: Nurse Practitioner

## 2014-10-21 ENCOUNTER — Institutional Professional Consult (permissible substitution): Payer: Medicare Other | Admitting: Critical Care Medicine

## 2014-10-21 ENCOUNTER — Encounter: Payer: Self-pay | Admitting: Nurse Practitioner

## 2014-10-21 ENCOUNTER — Ambulatory Visit (INDEPENDENT_AMBULATORY_CARE_PROVIDER_SITE_OTHER): Payer: Medicare Other | Admitting: Nurse Practitioner

## 2014-10-21 VITALS — BP 136/70 | HR 60 | Wt 204.0 lb

## 2014-10-21 DIAGNOSIS — G250 Essential tremor: Secondary | ICD-10-CM

## 2014-10-21 DIAGNOSIS — G252 Other specified forms of tremor: Secondary | ICD-10-CM

## 2014-10-21 DIAGNOSIS — R251 Tremor, unspecified: Secondary | ICD-10-CM

## 2014-10-21 DIAGNOSIS — G63 Polyneuropathy in diseases classified elsewhere: Secondary | ICD-10-CM

## 2014-10-21 DIAGNOSIS — R269 Unspecified abnormalities of gait and mobility: Secondary | ICD-10-CM | POA: Diagnosis not present

## 2014-10-21 MED ORDER — CLONAZEPAM 0.5 MG PO TABS: 0.5000 mg | ORAL_TABLET | Freq: Two times a day (BID) | ORAL | Status: DC

## 2014-10-21 MED ORDER — BACLOFEN 10 MG PO TABS: 10.0000 mg | ORAL_TABLET | Freq: Every day | ORAL | Status: DC

## 2014-10-21 MED ORDER — PREGABALIN 100 MG PO CAPS: ORAL_CAPSULE | ORAL | Status: DC

## 2014-10-21 NOTE — Progress Notes (Signed)
[facility]  PATIENT: [name] DOB: 1945-05-28   REASON FOR VISIT: Follow-up for history of essential tremor small fiber peripheral neuropathy gait disorder HISTORY FROM: Patient    HISTORY OF PRESENT ILLNESS:[name], 69 -year-old female returns for followup. She was last seen 12/18/2013 . She has a history of neuropathy, gait abnormality and essential tremor. Her neuropathy is well-controlled with Lyrica and her essential tremor is well controlled with clonazepam. She uses a walker to ambulate. She has not had any falls since last seen. She continues to live alone and is independent with her activities of daily living. She does have help 2 days a week for 2 hours. She had a pacemaker placed last month at [facility]. She claims she needs to have knee surgery but has been told she needs to lose weight prior to the surgery. She has no new neurologic issues. She returns for reevaluation.   HISTORY:of obesity and a prior L1 compression fracture with some compromise of the spinal cord. The patient has had a chronic gait disorder. The patient indicates that on 06/19/2012, she stooped over to pick up a brick, and apparently blacked out. The patient fractured her left femur, and this required surgery. The patient has spent several months in a rehabilitation facility, but she is now back home. The patient is walking with a walker, and she has ongoing discomfort in the left hip when she stands up. The patient indicates that her peripheral neuropathy discomfort is still present, but the Lyrica in the evening allows her to rest. The patient has asthma, and she has significant shortness of breath with minimal activity. The patient returns for an evaluation. The patient indicates that the clonazepam does help her tremors.     REVIEW OF SYSTEMS: Full 14 system review of systems performed and notable only for those listed, all others are neg:  Constitutional: neg  Cardiovascular:  Recent pacemaker Ear/Nose/Throat: neg  Skin: neg Eyes: Blurred vision Respiratory: Shortness of breath Gastroitestinal: neg  Hematology/Lymphatic: Easy bruising  Endocrine: neg Musculoskeletal:neg Allergy/Immunology: Environmental allergies Neurological: neg Psychiatric: neg Sleep : neg   ALLERGIES: Allergies  Allergen Reactions  . Latex Hives  . Peanuts [Peanut Oil] Anaphylaxis and Swelling  . Sulfa Antibiotics Shortness Of Breath  . Neosporin [Neomycin-Polymyxin-Gramicidin] Rash  . Penicillins Rash    HOME MEDICATIONS: Outpatient Prescriptions Prior to Visit  Medication Sig Dispense Refill  . atorvastatin (LIPITOR) 20 MG tablet Take 20 mg by mouth every evening.     . baclofen (LIORESAL) 10 MG tablet Take 1 tablet (10 mg total) by mouth at bedtime. 90 tablet 1  . budesonide-formoterol (SYMBICORT) 160-4.5 MCG/ACT inhaler Inhale 1-2 puffs into the lungs 2 (two) times daily. 2 puffs every morning and 1 puff every evening    . bumetanide (BUMEX) 2 MG tablet Take 4 mg by mouth daily as needed. For swelling    . cetirizine (ZYRTEC) 10 MG tablet Take 10 mg by mouth daily.    . clonazePAM (KLONOPIN) 0.5 MG tablet Take 1 tablet (0.5 mg total) by mouth 2 (two) times daily. 180 tablet 1  . dexlansoprazole (DEXILANT) 60 MG capsule Take 60 mg by mouth every evening.     Marland Kitchen FLUoxetine (PROZAC) 20 MG tablet Take 40 mg by mouth every morning. PT TAKES 2 CAPS FOR 40MG  DOSAGE    . fluticasone (FLONASE) 50 MCG/ACT nasal spray Place 2 sprays into the nose 2 (two) times daily as needed.     Marland Kitchen HYDROcodone-acetaminophen (NORCO)  10-325 MG per tablet Take 1 tablet by mouth every 6 (six) hours as needed.    . lamoTRIgine (LAMICTAL) 150 MG tablet Take 75-150 mg by mouth 2 (two) times daily. Take 150 mg every morning and 75 mg every evening    . levalbuterol (XOPENEX HFA) 45 MCG/ACT inhaler Inhale 1-2 puffs into the lungs every 4 (four) hours as needed. For shortness of breath    . lisinopril  (PRINIVIL,ZESTRIL) 5 MG tablet     . nisoldipine (SULAR) 17 MG 24 hr tablet Take 17 mg by mouth every morning.      Marland Kitchen oxybutynin (DITROPAN) 5 MG tablet     . potassium chloride 20 MEQ/15ML (10%) SOLN Take 40 mEq by mouth every evening. Mixed in water. Takes after evening meal so it won't burn stomach    . pregabalin (LYRICA) 100 MG capsule Take 1 capsule every morning. Take 4 capsules every evening 360 capsule 1  . traZODone (DESYREL) 100 MG tablet Take 100 mg by mouth at bedtime.    . bimatoprost (LUMIGAN) 0.01 % SOLN Place 1 drop into both eyes 2 (two) times daily.     . brinzolamide (AZOPT) 1 % ophthalmic suspension Place 1 drop into both eyes 2 (two) times daily.      . methocarbamol (ROBAXIN) 500 MG tablet Take 500 mg by mouth every 8 (eight) hours as needed. SPASMS     No facility-administered medications prior to visit.    PAST MEDICAL HISTORY: Past Medical History  Diagnosis Date  . GERD (gastroesophageal reflux disease)   . Peripheral vascular disease   . Cataracts, bilateral   . Glaucoma   . Asthma   . Depression   . Hypertension   . Arthritis   . Anxiety   . CHF (congestive heart failure) 08/15/2012  . Peripheral neuropathy     Possible small fiber neuropathy  . Dyslipidemia   . Obesity   . Ankle fracture, right   . Degenerative arthritis   . Abnormality of gait 10/25/2012  . Polyneuropathy in other diseases classified elsewhere 10/25/2012  . Compression fracture of L1 lumbar vertebra     PAST SURGICAL HISTORY: Past Surgical History  Procedure Laterality Date  . Back surgery    . Bladder suspension      x3  . Knee arhtroscopy    . Hand / finger lesion excision    . Im nailing femoral shaft fracture Right 06/20/2012    Dr Sunnie Nielsen  . Femur im nail Left 06/20/2012    Procedure: INTRAMEDULLARY (IM) NAIL FEMORAL;  Surgeon: Verlee Rossetti, MD;  Location: [facility] OR;  Service: Orthopedics;  Laterality: Left;  . Tonsillectomy    . Abdominal hysterectomy    . Cholecystectomy     . Cataract extraction Bilateral   . Extensor tendon of forearm / wrist repair    . Kyphoplasty    . Right forearm incision and drainage    . Umbilical hernia repair    . Arthroscopic surgery      Left knee  . Pace maker  09-21-14    [facility]    FAMILY HISTORY: Family History  Problem Relation Age of Onset  . Diabetes Maternal Aunt   . Parkinsonism Cousin   . Heart disease Mother   . Stroke Mother   . Cancer Father     SOCIAL HISTORY: History   Social History  . Marital Status: Divorced    Spouse Name: N/A  . Number of Children: 1  . Years of  Education: 16   Occupational History  . retired    Social History Main Topics  . Smoking status: Never Smoker   . Smokeless tobacco: Never Used  . Alcohol Use: 0.6 oz/week    1 Glasses of wine per week     Comment: daily  . Drug Use: No  . Sexual Activity: Not on file   Other Topics Concern  . Not on file   Social History Narrative   Patient lives at home with her cat.    Patient is divorced.    Patient has 1 child.    Patient is retired.    Patient has a college education.      PHYSICAL EXAM  Filed Vitals:   10/21/14 1329  BP: 136/70  Pulse: 60  Weight: 204 lb (92.534 kg)   Body mass index is 39.84 kg/(m^2). Generalized: Well developed, obese female in no acute distress  Skin 2+ edema below the knees bilaterally  Neurological examination  Mentation: Alert oriented to time, place, history taking. Follows all commands speech and language fluent  Cranial nerve II-XII: Pupils were equal round reactive to light extraocular movements were full, visual fields were full on confrontational test. Facial sensation and strength were normal.Very  hard of hearing. Uvula tongue midline. head turning and shoulder shrug were normal and symmetric.Tongue protrusion into cheek strength was normal.  Motor: normal bulk and tone, full strength in the BUE, BLE, No focal weakness  Sensory: normal and symmetric to pinprick, and  vibration  Coordination: finger-nose-finger normal, difficulty with heel-to-shin bilaterally, no dysmetria  Reflexes: Symmetric upper and lower and mildly brisk throughout  Gait and Station: Rising up from seated position without assistance, wide based stance, Unsteady gait. Uses a rolling walker, tandem gait not attempted, Romberg negative   DIAGNOSTIC DATA (LABS, IMAGING, TESTING) -    ASSESSMENT AND PLAN  68 y.o. year old female  has a past medical history of small fiber peripheral neuropathy, obesity, degenerative arthritis, essential tremor and abnormality of gait here  to follow-up   Continue clonazepam for essential tremor will refill Continue Lyrica  For neuropathy will refill Continue baclofen will refill Follow-up in 8 months next visit with Dr. Woody Seller, [facility], APRN  [facility] [address], [address] [place], [place] [address] [phone]

## 2014-10-21 NOTE — Progress Notes (Signed)
I have read the note, and I agree with the clinical assessment and plan.  Riccardo Holeman KEITH   

## 2014-10-21 NOTE — Patient Instructions (Addendum)
Continue clonazepam for essential tremor will refill Continue Lyrica  For neuropathy will refill Continue baclofen will refill Follow-up in 8 months next visit with Dr. Anne Hahn

## 2014-11-13 DIAGNOSIS — F3170 Bipolar disorder, currently in remission, most recent episode unspecified: Secondary | ICD-10-CM | POA: Insufficient documentation

## 2014-11-13 DIAGNOSIS — Z950 Presence of cardiac pacemaker: Secondary | ICD-10-CM | POA: Insufficient documentation

## 2014-11-13 DIAGNOSIS — E785 Hyperlipidemia, unspecified: Secondary | ICD-10-CM | POA: Insufficient documentation

## 2014-11-18 DIAGNOSIS — K219 Gastro-esophageal reflux disease without esophagitis: Secondary | ICD-10-CM | POA: Insufficient documentation

## 2014-11-18 DIAGNOSIS — J309 Allergic rhinitis, unspecified: Principal | ICD-10-CM

## 2014-11-18 DIAGNOSIS — J4540 Moderate persistent asthma, uncomplicated: Secondary | ICD-10-CM | POA: Insufficient documentation

## 2014-11-18 DIAGNOSIS — H1010 Acute atopic conjunctivitis, unspecified eye: Secondary | ICD-10-CM | POA: Insufficient documentation

## 2014-11-18 DIAGNOSIS — J449 Chronic obstructive pulmonary disease, unspecified: Secondary | ICD-10-CM

## 2014-11-18 DIAGNOSIS — T7800XA Anaphylactic reaction due to unspecified food, initial encounter: Secondary | ICD-10-CM | POA: Insufficient documentation

## 2014-11-18 HISTORY — DX: Chronic obstructive pulmonary disease, unspecified: J44.9

## 2015-02-03 ENCOUNTER — Other Ambulatory Visit: Payer: Self-pay | Admitting: Neurology

## 2015-02-03 MED ORDER — BACLOFEN 10 MG PO TABS: 10.0000 mg | ORAL_TABLET | Freq: Every day | ORAL | Status: DC

## 2015-02-03 MED ORDER — CLONAZEPAM 0.5 MG PO TABS: 0.5000 mg | ORAL_TABLET | Freq: Two times a day (BID) | ORAL | Status: DC

## 2015-02-03 MED ORDER — PREGABALIN 100 MG PO CAPS: ORAL_CAPSULE | ORAL | Status: DC

## 2015-02-03 NOTE — Telephone Encounter (Signed)
Rx signed and faxed.

## 2015-02-03 NOTE — Telephone Encounter (Signed)
Pt needs refill on pregabalin (LYRICA) 100 MG capsule, baclofen (LIORESAL) 10 MG tablet, clonazePAM (KLONOPIN) 0.5 MG tablet, thank you.

## 2015-02-03 NOTE — Telephone Encounter (Signed)
Request entered, forwarded to provider for approval.  

## 2015-02-20 ENCOUNTER — Ambulatory Visit: Payer: Medicare Other | Admitting: Allergy and Immunology

## 2015-03-22 HISTORY — PX: PACEMAKER IMPLANT: EP1218

## 2015-04-06 ENCOUNTER — Ambulatory Visit: Payer: Medicare Other | Admitting: Allergy and Immunology

## 2015-05-11 ENCOUNTER — Ambulatory Visit: Payer: Medicare Other | Admitting: Allergy and Immunology

## 2015-05-11 DIAGNOSIS — F319 Bipolar disorder, unspecified: Secondary | ICD-10-CM | POA: Insufficient documentation

## 2015-05-11 DIAGNOSIS — G301 Alzheimer's disease with late onset: Secondary | ICD-10-CM

## 2015-05-11 DIAGNOSIS — F0280 Dementia in other diseases classified elsewhere without behavioral disturbance: Secondary | ICD-10-CM | POA: Insufficient documentation

## 2015-05-11 DIAGNOSIS — D509 Iron deficiency anemia, unspecified: Secondary | ICD-10-CM | POA: Insufficient documentation

## 2015-05-11 DIAGNOSIS — G4733 Obstructive sleep apnea (adult) (pediatric): Secondary | ICD-10-CM | POA: Insufficient documentation

## 2015-05-11 DIAGNOSIS — M150 Primary generalized (osteo)arthritis: Secondary | ICD-10-CM

## 2015-05-11 DIAGNOSIS — M159 Polyosteoarthritis, unspecified: Secondary | ICD-10-CM | POA: Insufficient documentation

## 2015-05-15 ENCOUNTER — Ambulatory Visit: Payer: Medicare Other | Admitting: Nurse Practitioner

## 2015-05-26 ENCOUNTER — Ambulatory Visit: Payer: Medicare Other | Admitting: Nurse Practitioner

## 2015-06-23 ENCOUNTER — Ambulatory Visit (INDEPENDENT_AMBULATORY_CARE_PROVIDER_SITE_OTHER): Payer: Medicare Other | Admitting: Neurology

## 2015-06-23 ENCOUNTER — Encounter: Payer: Self-pay | Admitting: Neurology

## 2015-06-23 VITALS — BP 144/71 | HR 79 | Ht 60.0 in | Wt 211.5 lb

## 2015-06-23 DIAGNOSIS — R269 Unspecified abnormalities of gait and mobility: Secondary | ICD-10-CM | POA: Diagnosis not present

## 2015-06-23 DIAGNOSIS — G63 Polyneuropathy in diseases classified elsewhere: Secondary | ICD-10-CM | POA: Diagnosis not present

## 2015-06-23 MED ORDER — CLONAZEPAM 0.5 MG PO TABS: 0.5000 mg | ORAL_TABLET | Freq: Two times a day (BID) | ORAL | Status: DC

## 2015-06-23 MED ORDER — BACLOFEN 10 MG PO TABS: 10.0000 mg | ORAL_TABLET | Freq: Every day | ORAL | Status: DC

## 2015-06-23 MED ORDER — PREGABALIN 100 MG PO CAPS: ORAL_CAPSULE | ORAL | Status: DC

## 2015-06-23 NOTE — Progress Notes (Signed)
Reason for visit: Tremor  [name] is an 70 y.o. female  History of present illness:  Ms. [name] is a 70 year old right-handed white female with a history of obesity, degenerative arthritis of the knees, tremor, and peripheral neuropathy. The patient is doing relatively well with her symptoms. She indicates that her tremor really is not a problem until the afternoon, she has minimal tremor during the earlier parts of the day. She is on Lyrica taking 400 mg at night, 100 mg in the morning. She is taking some clonazepam during the day for the tremor. She is having some daytime drowsiness, she will be having a sleep study in the near future. She uses a walker for ambulation, she has not had any falls. She reports significant discomfort in the knees with weightbearing, she has a Baker's cyst on the left that increases her discomfort. She returns to this office for an evaluation.  Past Medical History  Diagnosis Date  . GERD (gastroesophageal reflux disease)   . Peripheral vascular disease (HCC)   . Cataracts, bilateral   . Glaucoma   . Asthma   . Depression   . Hypertension   . Arthritis   . Anxiety   . CHF (congestive heart failure) (HCC) 08/15/2012  . Peripheral neuropathy (HCC)     Possible small fiber neuropathy  . Dyslipidemia   . Obesity   . Ankle fracture, right   . Degenerative arthritis   . Abnormality of gait 10/25/2012  . Polyneuropathy in other diseases classified elsewhere (HCC) 10/25/2012  . Compression fracture of L1 lumbar vertebra (HCC)   . Obstructive lung disease (HCC) 11/18/2014    Past Surgical History  Procedure Laterality Date  . Back surgery    . Bladder suspension      x3  . Knee arhtroscopy    . Hand / finger lesion excision    . Im nailing femoral shaft fracture Right 06/20/2012    Dr Sunnie Nielsenegalado  . Femur im nail Left 06/20/2012    Procedure: INTRAMEDULLARY (IM) NAIL FEMORAL;  Surgeon: Verlee RossettiSteven R Norris, MD;  [facility];  Service: Orthopedics;   Laterality: Left;  . Tonsillectomy    . Abdominal hysterectomy    . Cholecystectomy    . Cataract extraction Bilateral   . Extensor tendon of forearm / wrist repair    . Kyphoplasty    . Right forearm incision and drainage    . Umbilical hernia repair    . Arthroscopic surgery      Left knee  . Pace maker  09-21-14    [facility]    Family History  Problem Relation Age of Onset  . Diabetes Maternal Aunt   . Parkinsonism Cousin   . Heart disease Mother   . Stroke Mother   . Cancer Father     Social history:  reports that she has never smoked. She has never used smokeless tobacco. She reports that she drinks about 0.6 oz of alcohol per week. She reports that she does not use illicit drugs.    Allergies  Allergen Reactions  . Latex Hives  . Peanuts [Peanut Oil] Anaphylaxis and Swelling  . Sulfa Antibiotics Shortness Of Breath  . Codeine   . Paxil [Paroxetine Hcl]   . Neosporin [Neomycin-Polymyxin-Gramicidin] Rash  . Penicillins Rash    Medications:  Prior to Admission medications   Medication Sig Start Date End Date Taking? Authorizing Provider  atorvastatin (LIPITOR) 20 MG tablet Take 20 mg by mouth every evening.  Yes Historical Provider, MD  baclofen (LIORESAL) 10 MG tablet Take 1 tablet (10 mg total) by mouth at bedtime. 06/23/15  Yes York Spaniel, MD  budesonide-formoterol [facility]) 160-4.5 MCG/ACT inhaler Inhale 1-2 puffs into the lungs 2 (two) times daily. 2 puffs every morning and 1 puff every evening   Yes Historical Provider, MD  bumetanide (BUMEX) 2 MG tablet Take 4 mg by mouth daily as needed. For swelling   Yes Historical Provider, MD  cetirizine (ZYRTEC) 10 MG tablet Take 10 mg by mouth daily.   Yes Historical Provider, MD  ciclesonide (OMNARIS) 50 MCG/ACT nasal spray Place 1 spray into both nostrils 2 (two) times daily.   Yes Historical Provider, MD  clonazePAM (KLONOPIN) 0.5 MG tablet Take 1 tablet (0.5 mg total) by mouth 2 (two) times daily. 06/23/15  Yes  York Spaniel, MD  dexlansoprazole (DEXILANT) 60 MG capsule Take 60 mg by mouth every evening.    Yes Historical Provider, MD  FLUoxetine (PROZAC) 20 MG tablet Take 40 mg by mouth every morning. PT TAKES 2 CAPS FOR  DOSAGE   Yes Historical Provider, MD  fluticasone (FLONASE) 50 MCG/ACT nasal spray Place 2 sprays into the nose 2 (two) times daily as needed.    Yes Historical Provider, MD  HYDROcodone-acetaminophen (NORCO) 10-325 MG per tablet Take 1 tablet by mouth every 6 (six) hours as needed.   Yes Historical Provider, MD  ipratropium (ATROVENT) 0.06 % nasal spray Place 2 sprays into both nostrils 4 (four) times daily as needed for rhinitis.   Yes Historical Provider, MD  lamoTRIgine (LAMICTAL) 150 MG tablet Take 75-150 mg by mouth 2 (two) times daily. Take 150 mg every morning and 75 mg every evening   Yes Historical Provider, MD  levalbuterol (XOPENEX HFA) 45 MCG/ACT inhaler Inhale 1-2 puffs into the lungs every 4 (four) hours as needed. For shortness of breath   Yes Historical Provider, MD  lisinopril (PRINIVIL,ZESTRIL) 5 MG tablet  04/24/13  Yes Historical Provider, MD  nisoldipine (SULAR) 17 MG 24 hr tablet Take 17 mg by mouth every morning.     Yes Historical Provider, MD  oxybutynin (DITROPAN) 5 MG tablet  04/29/13  Yes Historical Provider, MD  potassium chloride 20 MEQ/15ML (10%) SOLN Take 40 mEq by mouth every evening. Mixed in water. Takes after evening meal so it won't burn stomach   Yes Historical Provider, MD  pregabalin (LYRICA) 100 MG capsule Take 1 capsule every morning. Take 4 capsules every evening 06/23/15  Yes York Spaniel, MD  ranitidine (ZANTAC) 300 MG tablet Take 300 mg by mouth at bedtime.   Yes Historical Provider, MD  traZODone (DESYREL) 100 MG tablet Take 100 mg by mouth at bedtime.   Yes Historical Provider, MD  ZIOPTAN 0.0015 % SOLN 1 gtt each eye daily 08/14/14  Yes Historical Provider, MD    ROS:  Out of a complete 14 system review of symptoms, the patient  complains only of the following symptoms, and all other reviewed systems are negative.  Knee discomfort Tremor Neuropathy pain  Blood pressure 144/71, pulse 79, height 5' (1.524 m), weight 211 lb 8 oz (95.936 kg).  Physical Exam  General: The patient is alert and cooperative at the time of the examination. The patient is markedly obese.  Skin: 2+ edema below the knees is noted bilaterally.   Neurologic Exam  Mental status: The patient is alert and oriented x 3 at the time of the examination. The patient has apparent normal recent  and remote memory, with an apparently normal attention span and concentration ability.   Cranial nerves: Facial symmetry is present. Speech is normal, no aphasia or dysarthria is noted. Extraocular movements are full. Visual fields are full.  Motor: The patient has good strength in all 4 extremities.  Sensory examination: Soft touch sensation is symmetric on the face, arms, and legs.  Coordination: The patient has good finger-nose-finger and heel-to-shin bilaterally.  Gait and station: The patient has a slightly wide-based gait, she walks with a walker with good stability. Romberg is negative, tandem gait was not attempted.  Reflexes: Deep tendon reflexes are symmetric, but are decreased.   Assessment/Plan:  1. Peripheral neuropathy  2. Tremor  The patient is doing relatively well at this time, she remained stable with her clinical symptoms. A lot of her pain in the legs is related to arthritis with walking. She was given refills on her clonazepam, Lyrica, and baclofen. Will follow-up in one year, sooner if needed. She is getting a sleep study in the near future.  Marlan Palau MD 06/23/2015 7:50 PM  [facility] [address] [place], [place] [address]  Phone [phone] Fax [phone]

## 2015-07-20 ENCOUNTER — Ambulatory Visit: Payer: Medicare Other | Admitting: Allergy and Immunology

## 2015-12-29 DIAGNOSIS — R296 Repeated falls: Secondary | ICD-10-CM

## 2015-12-29 DIAGNOSIS — E785 Hyperlipidemia, unspecified: Secondary | ICD-10-CM

## 2015-12-29 DIAGNOSIS — R531 Weakness: Secondary | ICD-10-CM | POA: Diagnosis not present

## 2015-12-29 DIAGNOSIS — L03119 Cellulitis of unspecified part of limb: Secondary | ICD-10-CM | POA: Diagnosis not present

## 2015-12-29 DIAGNOSIS — N390 Urinary tract infection, site not specified: Secondary | ICD-10-CM

## 2015-12-29 DIAGNOSIS — M25562 Pain in left knee: Secondary | ICD-10-CM | POA: Diagnosis not present

## 2015-12-29 DIAGNOSIS — I10 Essential (primary) hypertension: Secondary | ICD-10-CM

## 2015-12-29 DIAGNOSIS — F319 Bipolar disorder, unspecified: Secondary | ICD-10-CM

## 2015-12-29 DIAGNOSIS — K219 Gastro-esophageal reflux disease without esophagitis: Secondary | ICD-10-CM

## 2015-12-30 DIAGNOSIS — L03119 Cellulitis of unspecified part of limb: Secondary | ICD-10-CM | POA: Diagnosis not present

## 2015-12-30 DIAGNOSIS — R531 Weakness: Secondary | ICD-10-CM | POA: Diagnosis not present

## 2015-12-30 DIAGNOSIS — N390 Urinary tract infection, site not specified: Secondary | ICD-10-CM | POA: Diagnosis not present

## 2015-12-30 DIAGNOSIS — M25562 Pain in left knee: Secondary | ICD-10-CM | POA: Diagnosis not present

## 2015-12-31 DIAGNOSIS — M25562 Pain in left knee: Secondary | ICD-10-CM | POA: Diagnosis not present

## 2015-12-31 DIAGNOSIS — N390 Urinary tract infection, site not specified: Secondary | ICD-10-CM | POA: Diagnosis not present

## 2015-12-31 DIAGNOSIS — R531 Weakness: Secondary | ICD-10-CM | POA: Diagnosis not present

## 2015-12-31 DIAGNOSIS — L03119 Cellulitis of unspecified part of limb: Secondary | ICD-10-CM | POA: Diagnosis not present

## 2016-01-01 DIAGNOSIS — R531 Weakness: Secondary | ICD-10-CM | POA: Diagnosis not present

## 2016-01-01 DIAGNOSIS — M25562 Pain in left knee: Secondary | ICD-10-CM | POA: Diagnosis not present

## 2016-01-01 DIAGNOSIS — N390 Urinary tract infection, site not specified: Secondary | ICD-10-CM | POA: Diagnosis not present

## 2016-01-01 DIAGNOSIS — L03119 Cellulitis of unspecified part of limb: Secondary | ICD-10-CM | POA: Diagnosis not present

## 2016-02-08 ENCOUNTER — Telehealth: Payer: Self-pay | Admitting: Neurology

## 2016-02-08 MED ORDER — PREGABALIN 100 MG PO CAPS: ORAL_CAPSULE | ORAL | 1 refills | Status: DC

## 2016-02-08 NOTE — Addendum Note (Signed)
Addended by: Donnelly AngelicaHOGAN, Quinlin Conant L on: 02/08/2016 09:56 AM   Modules accepted: Orders

## 2016-02-08 NOTE — Telephone Encounter (Signed)
Patient requesting refill of  pregabalin (LYRICA) 100 MG capsule. [facility] to [facility] on [address] in [place].

## 2016-02-08 NOTE — Telephone Encounter (Signed)
Rx signed and faxed to pharmacy

## 2016-02-08 NOTE — Telephone Encounter (Signed)
Rx printed, awaiting signature. 

## 2016-02-18 ENCOUNTER — Other Ambulatory Visit: Payer: Self-pay | Admitting: *Deleted

## 2016-02-18 MED ORDER — PREGABALIN 100 MG PO CAPS: ORAL_CAPSULE | ORAL | 1 refills | Status: DC

## 2016-02-18 NOTE — Telephone Encounter (Signed)
Request received from [facility] in [place].  Called [facility] [phone]) and [facility] to [facility] - voided rx faxed to them on 02/08/16 (not filled yet).  New rx printed, signed and faxed to [facility] at [phone].

## 2016-04-25 ENCOUNTER — Ambulatory Visit: Payer: Medicare Other | Admitting: Allergy and Immunology

## 2016-04-28 ENCOUNTER — Ambulatory Visit: Payer: Medicare Other | Admitting: Allergy and Immunology

## 2016-04-29 ENCOUNTER — Institutional Professional Consult (permissible substitution): Payer: Medicare Other | Admitting: Internal Medicine

## 2016-05-12 DIAGNOSIS — I420 Dilated cardiomyopathy: Secondary | ICD-10-CM | POA: Insufficient documentation

## 2016-05-16 ENCOUNTER — Other Ambulatory Visit: Payer: Self-pay | Admitting: *Deleted

## 2016-05-16 ENCOUNTER — Encounter: Payer: Self-pay | Admitting: *Deleted

## 2016-05-16 MED ORDER — CLONAZEPAM 0.5 MG PO TABS: 0.5000 mg | ORAL_TABLET | Freq: Two times a day (BID) | ORAL | 0 refills | Status: DC

## 2016-05-16 NOTE — Progress Notes (Signed)
Received fax request from [facility] pharmacy to refill pt rx clonazepam. Faxed printed/signed rx clonazepam to pt pharmacy. Fax: [phone]. Received confirmation.

## 2016-05-31 ENCOUNTER — Telehealth: Payer: Self-pay | Admitting: Pulmonary Disease

## 2016-05-31 NOTE — Telephone Encounter (Signed)
Left a vm for pt, we did not receive the sleep study results or office notes  she was suppose to have faxed to us. Left triage([phone]) fax number for pt to have them re-faxed.

## 2016-06-01 ENCOUNTER — Institutional Professional Consult (permissible substitution): Payer: Medicare Other | Admitting: Pulmonary Disease

## 2016-06-22 ENCOUNTER — Ambulatory Visit (INDEPENDENT_AMBULATORY_CARE_PROVIDER_SITE_OTHER): Payer: Medicare Other | Admitting: Nurse Practitioner

## 2016-06-22 ENCOUNTER — Encounter: Payer: Self-pay | Admitting: Nurse Practitioner

## 2016-06-22 VITALS — BP 138/72 | HR 66 | Wt 213.6 lb

## 2016-06-22 DIAGNOSIS — R269 Unspecified abnormalities of gait and mobility: Secondary | ICD-10-CM | POA: Diagnosis not present

## 2016-06-22 DIAGNOSIS — G250 Essential tremor: Secondary | ICD-10-CM | POA: Diagnosis not present

## 2016-06-22 DIAGNOSIS — G63 Polyneuropathy in diseases classified elsewhere: Secondary | ICD-10-CM | POA: Diagnosis not present

## 2016-06-22 MED ORDER — BACLOFEN 10 MG PO TABS: 10.0000 mg | ORAL_TABLET | Freq: Every day | ORAL | 3 refills | Status: AC

## 2016-06-22 MED ORDER — CLONAZEPAM 0.5 MG PO TABS: 0.5000 mg | ORAL_TABLET | Freq: Two times a day (BID) | ORAL | 1 refills | Status: DC

## 2016-06-22 MED ORDER — PREGABALIN 100 MG PO CAPS: ORAL_CAPSULE | ORAL | 1 refills | Status: DC

## 2016-06-22 NOTE — Progress Notes (Signed)
I have read the note, and I agree with the clinical assessment and plan.  [name] [name]   

## 2016-06-22 NOTE — Patient Instructions (Signed)
Continue Clonazepin, Lyrica and baclofen will refill Continue CPAP  Follow up yearly

## 2016-06-22 NOTE — Progress Notes (Signed)
[facility]  PATIENT: [name] DOB: 1946/02/23   REASON FOR VISIT: Follow-up for peripheral neuropathy, tremor HISTORY FROM: Patient alone at visit    HISTORY OF PRESENT ILLNESS:[name] is a 71 year old right-handed white female with a history of obesity, degenerative arthritis of the knees, tremor, and peripheral neuropathy. The patient continues to do  relatively well with her symptoms. She indicates that her tremor really is not a problem until the afternoon, she has minimal tremor during the earlier parts of the day. She is on Lyrica taking 400 mg at night, 100 mg in the morning. She is taking some clonazepam during the day for the tremor. Recent sleep study confirmed obstructive sleep apnea and she is on CPAP at 8 cm. She claims she has much less fatigue and no daytime drowsiness. She uses a walker for ambulation, she has not had any falls. She reports significant discomfort in the knees with weightbearing, she has a Baker's cyst on the left that increases her discomfort. 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 She returns to this office for an evaluation.    REVIEW OF SYSTEMS: Full 14 system review of systems performed and notable only for those listed, all others are neg:  Constitutional: neg  Cardiovascular: neg Ear/Nose/Throat: neg  Skin: neg Eyes: neg Respiratory: neg Gastroitestinal: neg  Hematology/Lymphatic: Easy bruising  Endocrine: neg Musculoskeletal: Joint pain, walking difficulty Allergy/Immunology: neg Neurological: neg Psychiatric: Anxiety Sleep : neg   ALLERGIES: Allergies  Allergen Reactions  . Latex Hives  . Peanuts [Peanut Oil] Anaphylaxis and Swelling  . Sulfa Antibiotics Shortness Of Breath  . Codeine   . Metoprolol Other (See Comments)    unknown  . Paxil [Paroxetine Hcl]   . Benzalkonium Chloride Itching and Rash  . Neosporin [Neomycin-Polymyxin-Gramicidin] Rash  . Penicillins Rash    HOME  MEDICATIONS: Outpatient Medications Prior to Visit  Medication Sig Dispense Refill  . atorvastatin (LIPITOR) 20 MG tablet Take 20 mg by mouth every evening.     . baclofen (LIORESAL) 10 MG tablet Take 1 tablet (10 mg total) by mouth at bedtime. 90 tablet 3  . budesonide-formoterol (SYMBICORT) 160-4.5 MCG/ACT inhaler Inhale 1-2 puffs into the lungs 2 (two) times daily. 2 puffs every morning and 1 puff every evening    . bumetanide (BUMEX) 2 MG tablet Take 4 mg by mouth daily as needed. For swelling    . cetirizine (ZYRTEC) 10 MG tablet Take 10 mg by mouth daily.    . clonazePAM (KLONOPIN) 0.5 MG tablet Take 1 tablet (0.5 mg total) by mouth 2 (two) times daily. 180 tablet 0  . dexlansoprazole (DEXILANT) 60 MG capsule Take 60 mg by mouth every evening.     Marland Kitchen FLUoxetine (PROZAC) 20 MG tablet Take 40 mg by mouth every morning. PT TAKES 2 CAPS FOR  DOSAGE    . fluticasone (FLONASE) 50 MCG/ACT nasal spray Place 2 sprays into the nose 2 (two) times daily as needed.     . lamoTRIgine (LAMICTAL) 150 MG tablet Take 75-150 mg by mouth 2 (two) times daily. Take 150 mg every morning and 75 mg every evening    . levalbuterol (XOPENEX HFA) 45 MCG/ACT inhaler Inhale 1-2 puffs into the lungs every 4 (four) hours as needed. For shortness of breath    . nisoldipine (SULAR) 17 MG 24 hr tablet Take 17 mg by mouth every morning.      Marland Kitchen oxybutynin (DITROPAN) 5 MG tablet     .  potassium chloride 20 MEQ/15ML (10%) SOLN Take 40 mEq by mouth every evening. Mixed in water. Takes after evening meal so it won't burn stomach    . pregabalin (LYRICA) 100 MG capsule Take 1 capsule every morning and 4 capsules every evening. 450 capsule 1  . traZODone (DESYREL) 100 MG tablet Take 100 mg by mouth at bedtime.    Marland Kitchen ZIOPTAN 0.0015 % SOLN 1 gtt each eye daily    . lisinopril (PRINIVIL,ZESTRIL) 5 MG tablet     . ciclesonide (OMNARIS) 50 MCG/ACT nasal spray Place 1 spray into both nostrils 2 (two) times daily.    Marland Kitchen  HYDROcodone-acetaminophen (NORCO) 10-325 MG per tablet Take 1 tablet by mouth every 6 (six) hours as needed.    Marland Kitchen ipratropium (ATROVENT) 0.06 % nasal spray Place 2 sprays into both nostrils 4 (four) times daily as needed for rhinitis.    . ranitidine (ZANTAC) 300 MG tablet Take 300 mg by mouth at bedtime.     No facility-administered medications prior to visit.     PAST MEDICAL HISTORY: Past Medical History:  Diagnosis Date  . Abnormality of gait 10/25/2012  . Ankle fracture, right   . Anxiety   . Arthritis   . Asthma   . Cataracts, bilateral   . CHF (congestive heart failure) (HCC) 08/15/2012  . Compression fracture of L1 lumbar vertebra (HCC)   . Degenerative arthritis   . Depression   . Dyslipidemia   . GERD (gastroesophageal reflux disease)   . Glaucoma   . Hypertension   . Obesity   . Obstructive lung disease (HCC) 11/18/2014  . Peripheral neuropathy (HCC)    Possible small fiber neuropathy  . Peripheral vascular disease (HCC)   . Polyneuropathy in other diseases classified elsewhere (HCC) 10/25/2012  . Sleep apnea    CPAP    PAST SURGICAL HISTORY: Past Surgical History:  Procedure Laterality Date  . ABDOMINAL HYSTERECTOMY    . Arthroscopic surgery     Left knee  . BACK SURGERY    . BLADDER SUSPENSION     x3  . CATARACT EXTRACTION Bilateral   . CHOLECYSTECTOMY    . EXTENSOR TENDON OF FOREARM / WRIST REPAIR    . FEMUR IM NAIL Left 06/20/2012   Procedure: INTRAMEDULLARY (IM) NAIL FEMORAL;  Surgeon: Verlee Rossetti, MD;  Location: [facility] OR;  Service: Orthopedics;  Laterality: Left;  . HAND / FINGER LESION EXCISION    . IM NAILING FEMORAL SHAFT FRACTURE Right 06/20/2012   Dr Sunnie Nielsen  . knee arhtroscopy    . KYPHOPLASTY    . pace maker  09-21-14   [facility]  . PACEMAKER IMPLANT  2017  . right forearm incision and drainage    . TONSILLECTOMY    . UMBILICAL HERNIA REPAIR      FAMILY HISTORY: Family History  Problem Relation Age of Onset  . Diabetes Maternal Aunt   .  Parkinsonism Cousin   . Heart disease Mother   . Stroke Mother   . Cancer Father     SOCIAL HISTORY: Social History   Social History  . Marital status: Divorced    Spouse name: N/A  . Number of children: 1  . Years of education: 77   Occupational History  . retired    Social History Main Topics  . Smoking status: Never Smoker  . Smokeless tobacco: Never Used  . Alcohol use 0.6 oz/week    1 Glasses of wine per week     Comment: daily  .  Drug use: No  . Sexual activity: Not on file   Other Topics Concern  . Not on file   Social History Narrative   Patient lives at home with her cat.    Patient is divorced.    Patient has 1 child.    Patient is retired.    Patient has a college education.      PHYSICAL EXAM  Vitals:   06/22/16 0942  BP: 138/72  Pulse: 66  Weight: 213 lb 9.6 oz (96.9 kg)   Body mass index is 41.72 kg/m.  Generalized: Well developed, Morbidly obese female in no acute distress  Head: normocephalic and atraumatic,. Oropharynx benign  Neck: Supple, no carotid bruits  Cardiac: Regular rate rhythm, no murmur  Musculoskeletal: No deformity  Skin 2+ edema below the knees bilaterally  Neurological examination   Mentation: Alert oriented to time, place, history taking. Attention span and concentration appropriate. Recent and remote memory intact.  Follows all commands speech and language fluent.   Cranial nerve II-XII: Pupils were equal round reactive to light extraocular movements were full, visual field were full on confrontational test. Facial sensation and strength were normal. hearing was intact to finger rubbing bilaterally. Uvula tongue midline. head turning and shoulder shrug were normal and symmetric.Tongue protrusion into cheek strength was normal. Motor: normal bulk and tone, full strength in the BUE, BLE, fine finger movements normal, no pronator drift.  Sensory: normal and symmetric to light touch, on the face arms and legs Coordination:  finger-nose-finger, heel-to-shin bilaterally, no dysmetria Reflexes: Depressed and symmetric, plantar responses were flexor bilaterally. Gait and Station: Rising up from seated position with push off, slightly wide based gait, ambulates with a walker with good stability. Tandem gait not attempted  DIAGNOSTIC DATA (LABS, IMAGING, TESTING) - I reviewed patient records, labs, notes, testing and imaging myself where available.     ASSESSMENT AND PLAN  71 y.o. year old female  has a past medical history of Abnormality of gait (10/25/2012);  (11/18/2014); Peripheral neuropathy (HCC); Polyneuropathy in other diseases classified elsewhere (HCC) (10/25/2012); and Sleep apnea. And essential tremor here to follow-up.   PLAN: Continue Clonazepin, Lyrica and baclofen will refill Continue CPAP at 8 cm.  Call for any worsening of symptoms Follow up yearly Nilda Riggs, [facility], [facility], APRN  [facility] [address] [place], [place] [address] [phone]

## 2016-08-21 ENCOUNTER — Emergency Department (HOSPITAL_COMMUNITY)
Admission: EM | Admit: 2016-08-21 | Discharge: 2016-08-22 | Disposition: A | Discharge status: Home / Self Care | Payer: Medicare Other | Attending: Emergency Medicine | Admitting: Emergency Medicine

## 2016-08-21 ENCOUNTER — Emergency Department (HOSPITAL_COMMUNITY): Payer: Medicare Other

## 2016-08-21 DIAGNOSIS — Z950 Presence of cardiac pacemaker: Secondary | ICD-10-CM | POA: Diagnosis not present

## 2016-08-21 DIAGNOSIS — Z79899 Other long term (current) drug therapy: Secondary | ICD-10-CM | POA: Insufficient documentation

## 2016-08-21 DIAGNOSIS — Z91040 Latex allergy status: Secondary | ICD-10-CM | POA: Insufficient documentation

## 2016-08-21 DIAGNOSIS — J4540 Moderate persistent asthma, uncomplicated: Secondary | ICD-10-CM | POA: Diagnosis not present

## 2016-08-21 DIAGNOSIS — Z91010 Allergy to peanuts: Secondary | ICD-10-CM | POA: Insufficient documentation

## 2016-08-21 DIAGNOSIS — M25551 Pain in right hip: Secondary | ICD-10-CM | POA: Insufficient documentation

## 2016-08-21 DIAGNOSIS — I509 Heart failure, unspecified: Secondary | ICD-10-CM | POA: Diagnosis not present

## 2016-08-21 DIAGNOSIS — M79604 Pain in right leg: Secondary | ICD-10-CM

## 2016-08-21 DIAGNOSIS — I110 Hypertensive heart disease with heart failure: Secondary | ICD-10-CM | POA: Diagnosis not present

## 2016-08-21 MED ORDER — LAMOTRIGINE 25 MG PO TABS
75.0000 mg | ORAL_TABLET | Freq: Every day | ORAL | Status: DC
  Administered 2016-08-22: 75 mg via ORAL
  Filled 2016-08-21: qty 3

## 2016-08-21 MED ORDER — FENTANYL CITRATE (PF) 100 MCG/2ML IJ SOLN
50.0000 ug | Freq: Once | INTRAMUSCULAR | Status: AC
  Administered 2016-08-21: 50 ug via INTRAMUSCULAR
  Filled 2016-08-21: qty 2

## 2016-08-21 MED ORDER — LAMOTRIGINE 150 MG PO TABS
150.0000 mg | ORAL_TABLET | Freq: Every day | ORAL | Status: DC
  Administered 2016-08-22: 150 mg via ORAL
  Filled 2016-08-21: qty 1

## 2016-08-21 MED ORDER — KETOROLAC TROMETHAMINE 30 MG/ML IJ SOLN
15.0000 mg | Freq: Once | INTRAMUSCULAR | Status: AC
Start: 2016-08-21 — End: 2016-08-21
  Administered 2016-08-21: 15 mg via INTRAMUSCULAR
  Filled 2016-08-21: qty 1

## 2016-08-21 MED ORDER — IPRATROPIUM BROMIDE 0.06 % NA SOLN: 2.0000 | Freq: Four times a day (QID) | NASAL | Status: DC | PRN

## 2016-08-21 MED ORDER — MORPHINE SULFATE (PF) 4 MG/ML IV SOLN
4.0000 mg | INTRAVENOUS | Status: DC | PRN
  Filled 2016-08-21: qty 1

## 2016-08-21 MED ORDER — BUMETANIDE 2 MG PO TABS
4.0000 mg | ORAL_TABLET | Freq: Every day | ORAL | Status: DC
  Administered 2016-08-22: 4 mg via ORAL
  Filled 2016-08-21: qty 2

## 2016-08-21 MED ORDER — POTASSIUM CHLORIDE 20 MEQ/15ML (10%) PO SOLN
40.0000 meq | Freq: Every evening | ORAL | Status: DC
  Administered 2016-08-22: 40 meq via ORAL
  Filled 2016-08-21: qty 30

## 2016-08-21 MED ORDER — FLUTICASONE PROPIONATE 50 MCG/ACT NA SUSP: 1.0000 | Freq: Two times a day (BID) | NASAL | Status: DC

## 2016-08-21 MED ORDER — LISINOPRIL 10 MG PO TABS
10.0000 mg | ORAL_TABLET | Freq: Every day | ORAL | Status: DC
  Administered 2016-08-22: 10 mg via ORAL
  Filled 2016-08-21: qty 1

## 2016-08-21 MED ORDER — FLUOXETINE HCL 20 MG PO CAPS
40.0000 mg | ORAL_CAPSULE | Freq: Every day | ORAL | Status: DC
  Administered 2016-08-22: 40 mg via ORAL
  Filled 2016-08-21: qty 2

## 2016-08-21 MED ORDER — CLONAZEPAM 0.5 MG PO TABS
0.5000 mg | ORAL_TABLET | Freq: Two times a day (BID) | ORAL | Status: DC
  Administered 2016-08-22 (×2): 0.5 mg via ORAL
  Filled 2016-08-21 (×2): qty 1

## 2016-08-21 MED ORDER — FLUTICASONE PROPIONATE 50 MCG/ACT NA SUSP: 2.0000 | Freq: Two times a day (BID) | NASAL | Status: DC | PRN

## 2016-08-21 MED ORDER — TRAZODONE HCL 100 MG PO TABS
100.0000 mg | ORAL_TABLET | Freq: Every day | ORAL | Status: DC
  Administered 2016-08-22: 100 mg via ORAL
  Filled 2016-08-21: qty 2

## 2016-08-21 MED ORDER — LATANOPROST 0.005 % OP SOLN: 1.0000 [drp] | Freq: Every day | OPHTHALMIC | Status: DC

## 2016-08-21 MED ORDER — ATORVASTATIN CALCIUM 10 MG PO TABS
20.0000 mg | ORAL_TABLET | Freq: Every evening | ORAL | Status: DC
  Administered 2016-08-22: 20 mg via ORAL
  Filled 2016-08-21: qty 1

## 2016-08-21 MED ORDER — PREGABALIN 100 MG PO CAPS
100.0000 mg | ORAL_CAPSULE | ORAL | Status: DC
  Administered 2016-08-22: 400 mg via ORAL
  Filled 2016-08-21: qty 3
  Filled 2016-08-21: qty 1

## 2016-08-21 MED ORDER — PANTOPRAZOLE SODIUM 40 MG PO TBEC
40.0000 mg | DELAYED_RELEASE_TABLET | Freq: Every day | ORAL | Status: DC
  Administered 2016-08-22: 40 mg via ORAL
  Filled 2016-08-21: qty 1

## 2016-08-21 MED ORDER — OXYBUTYNIN CHLORIDE 5 MG PO TABS
5.0000 mg | ORAL_TABLET | Freq: Every day | ORAL | Status: DC
  Administered 2016-08-22: 5 mg via ORAL
  Filled 2016-08-21: qty 1

## 2016-08-21 MED ORDER — PREDNISONE 20 MG PO TABS
60.0000 mg | ORAL_TABLET | ORAL | Status: AC
  Administered 2016-08-22: 60 mg via ORAL
  Filled 2016-08-21 (×3): qty 3

## 2016-08-21 MED ORDER — FENTANYL CITRATE (PF) 100 MCG/2ML IJ SOLN
50.0000 ug | Freq: Once | INTRAMUSCULAR | Status: AC
  Administered 2016-08-21: 50 ug via INTRAVENOUS
  Filled 2016-08-21: qty 2

## 2016-08-21 MED ORDER — NISOLDIPINE ER 17 MG PO TB24
17.0000 mg | ORAL_TABLET | Freq: Every day | ORAL | Status: DC
  Administered 2016-08-22: 17 mg via ORAL
  Filled 2016-08-21: qty 1

## 2016-08-21 MED ORDER — LORATADINE 10 MG PO TABS: 10.0000 mg | ORAL_TABLET | Freq: Every day | ORAL | Status: DC

## 2016-08-21 MED ORDER — MOMETASONE FURO-FORMOTEROL FUM 200-5 MCG/ACT IN AERO
2.0000 | INHALATION_SPRAY | Freq: Two times a day (BID) | RESPIRATORY_TRACT | Status: DC
  Administered 2016-08-22: 2 via RESPIRATORY_TRACT
  Filled 2016-08-21: qty 8.8

## 2016-08-21 MED ORDER — BACLOFEN 5 MG HALF TABLET
10.0000 mg | ORAL_TABLET | Freq: Every day | ORAL | Status: DC
  Administered 2016-08-22: 10 mg via ORAL
  Filled 2016-08-21: qty 1

## 2016-08-21 NOTE — ED Notes (Signed)
Patient transported to CT 

## 2016-08-21 NOTE — ED Provider Notes (Signed)
[facility]-EMERGENCY DEPT Provider Note   CSN: [id number] Arrival date & time: 08/21/16  1613     History   Chief Complaint Chief Complaint  Patient presents with  . Hip Pain    HPI [name] is a 71 y.o. female.  HPI  Patient presents with concern of hip and back pain. Initially the patient denies history of recent fall, but it seems as though about one month ago the patient had a unremarkable fall. About 3 weeks ago the patient developed pain in her right posterior lower back, hip, with radiation down the right lateral and posterior leg. The patient has become more severe over the past 2 or 3 days, now making it very difficult to get out of bed and go to the bathroom. No additional fall, no loss of sensation or weakness in the foot. No incontinence. No other new complaints, including fever, chills. Patient has had minimal relief in spite of using hydrocodone tablets, prescribed for her prior left hip replacement.   Past Medical History:  Diagnosis Date  . Abnormality of gait 10/25/2012  . Ankle fracture, right   . Anxiety   . Arthritis   . Asthma   . Cataracts, bilateral   . CHF (congestive heart failure) (HCC) 08/15/2012  . Compression fracture of L1 lumbar vertebra (HCC)   . Degenerative arthritis   . Depression   . Dyslipidemia   . GERD (gastroesophageal reflux disease)   . Glaucoma   . Hypertension   . Obesity   . Obstructive lung disease (HCC) 11/18/2014  . Peripheral neuropathy    Possible small fiber neuropathy  . Peripheral vascular disease (HCC)   . Polyneuropathy in other diseases classified elsewhere (HCC) 10/25/2012  . Sleep apnea    CPAP    Patient Active Problem List   Diagnosis Date Noted  . Obstructive lung disease (HCC) 11/18/2014  . Moderate persistent asthma 11/18/2014  . Allergic rhinoconjunctivitis 11/18/2014  . LPRD (laryngopharyngeal reflux disease) 11/18/2014  . Allergy with anaphylaxis due to food 11/18/2014  . Abnormality of gait  10/25/2012  . Polyneuropathy in other diseases classified elsewhere (HCC) 10/25/2012  . Constipation 09/20/2012  . Pneumonitis 09/17/2012  . Cough 09/17/2012  . Insomnia, unspecified 09/09/2012  . Bilateral lower extremity edema 08/15/2012  . Chronic pain syndrome 08/15/2012  . Essential hypertension, benign 08/15/2012  . Unspecified constipation 08/15/2012  . Generalized anxiety disorder 08/15/2012  . Other and unspecified hyperlipidemia 08/15/2012  . Insomnia 08/15/2012  . Muscle spasm 08/15/2012  . CHF (congestive heart failure) (HCC) 08/15/2012  . Cellulitis of great toe of left foot 08/15/2012  . Acute blood loss anemia 06/22/2012  . Thrombocytopenia, unspecified (HCC) 06/22/2012  . Femur fracture, left (HCC) 06/20/2012  . Asthma, chronic 06/20/2012  . Cellulitis 03/08/2012  . GERD (gastroesophageal reflux disease)   . Depression   . Syncope 03/07/2012  . Essential and other specified forms of tremor 01/11/2012  . Disturbance of skin sensation 01/11/2012  . Dyspnea 09/21/2011  . Acute renal failure (HCC) 09/19/2011  . Altered mental status 09/15/2011  . Spinal cord compression (HCC) 09/14/2011  . S/P spinal surgery 09/14/2011  . Dehydration 09/14/2011  . Hypotension 09/14/2011  . Oliguria 09/14/2011  . Ankle fracture 03/14/2011    Past Surgical History:  Procedure Laterality Date  . ABDOMINAL HYSTERECTOMY    . Arthroscopic surgery     Left knee  . BACK SURGERY    . BLADDER SUSPENSION     x3  . CATARACT EXTRACTION Bilateral   .  CHOLECYSTECTOMY    . EXTENSOR TENDON OF FOREARM / WRIST REPAIR    . FEMUR IM NAIL Left 06/20/2012   Procedure: INTRAMEDULLARY (IM) NAIL FEMORAL;  Surgeon: Verlee RossettiSteven R Norris, MD;  [facility];  Service: Orthopedics;  Laterality: Left;  . HAND / FINGER LESION EXCISION    . IM NAILING FEMORAL SHAFT FRACTURE Right 06/20/2012   Dr Sunnie Nielsenegalado  . knee arhtroscopy    . KYPHOPLASTY    . pace maker  09-21-14   [facility]  . PACEMAKER IMPLANT  2017  .  right forearm incision and drainage    . TONSILLECTOMY    . UMBILICAL HERNIA REPAIR      OB History    No data available       Home Medications    Prior to Admission medications   Medication Sig Start Date End Date Taking? Authorizing Provider  acetaminophen-codeine (TYLENOL #3) 300-30 MG tablet as needed. 06/20/16   [provider]  atorvastatin (LIPITOR) 20 MG tablet Take 20 mg by mouth every evening.     [provider]  baclofen (LIORESAL) 10 MG tablet Take 1 tablet (10 mg total) by mouth at bedtime. 06/22/16   Nilda RiggsMartin, Nancy Carolyn, NP  budesonide-formoterol [facility](SYMBICORT) 160-4.5 MCG/ACT inhaler Inhale 1-2 puffs into the lungs 2 (two) times daily. 2 puffs every morning and 1 puff every evening    [provider]  bumetanide (BUMEX) 2 MG tablet Take 4 mg by mouth daily as needed. For swelling    [provider]  cetirizine (ZYRTEC) 10 MG tablet Take 10 mg by mouth daily.    [provider]  ciclesonide (OMNARIS) 50 MCG/ACT nasal spray Place 1 spray into both nostrils 2 (two) times daily.    [provider]  clonazePAM (KLONOPIN) 0.5 MG tablet Take 1 tablet (0.5 mg total) by mouth 2 (two) times daily. 06/22/16   Nilda RiggsMartin, Nancy Carolyn, NP  dexlansoprazole (DEXILANT) 60 MG capsule Take 60 mg by mouth every evening.     [provider]  FLUoxetine (PROZAC) 20 MG tablet Take 40 mg by mouth every morning. PT TAKES 2 CAPS FOR 40MG  DOSAGE    [provider]  fluticasone (FLONASE) 50 MCG/ACT nasal spray Place 2 sprays into the nose 2 (two) times daily as needed.     [provider]  HYDROcodone-acetaminophen (NORCO) 10-325 MG per tablet Take 1 tablet by mouth every 6 (six) hours as needed.    [provider]  ipratropium (ATROVENT) 0.06 % nasal spray Place 2 sprays into both nostrils 4 (four) times daily as needed for rhinitis.    [provider]  lamoTRIgine (LAMICTAL) 150 MG tablet Take 75-150 mg by  mouth 2 (two) times daily. Take 150 mg every morning and 75 mg every evening    [provider]  levalbuterol (XOPENEX HFA) 45 MCG/ACT inhaler Inhale 1-2 puffs into the lungs every 4 (four) hours as needed. For shortness of breath    [provider]  lisinopril (PRINIVIL,ZESTRIL) 10 MG tablet 10 mg daily. 05/17/16   [provider]  nisoldipine (SULAR) 17 MG 24 hr tablet Take 17 mg by mouth every morning.      [provider]  oxybutynin (DITROPAN) 5 MG tablet  04/29/13   [provider]  potassium chloride 20 MEQ/15ML (10%) SOLN Take 40 mEq by mouth every evening. Mixed in water. Takes after evening meal so it won't burn stomach    [provider]  pregabalin (LYRICA) 100 MG  capsule Take 1 capsule every morning and 4 capsules every evening. 06/22/16   Nilda Riggs, NP  traZODone (DESYREL) 100 MG tablet Take 100 mg by mouth at bedtime.    [provider]  ZIOPTAN 0.0015 % SOLN 1 gtt each eye daily 08/14/14   [provider]    Family History Family History  Problem Relation Age of Onset  . Diabetes Maternal Aunt   . Parkinsonism Cousin   . Heart disease Mother   . Stroke Mother   . Cancer Father     Social History Social History  Substance Use Topics  . Smoking status: Never Smoker  . Smokeless tobacco: Never Used  . Alcohol use 0.6 oz/week    1 Glasses of wine per week     Comment: daily     Allergies   Latex; Peanuts [peanut oil]; Sulfa antibiotics; Codeine; Metoprolol; Paxil [paroxetine hcl]; Benzalkonium chloride; Neosporin [neomycin-polymyxin-gramicidin]; and Penicillins   Review of Systems Review of Systems  Constitutional:       Per HPI, otherwise negative  HENT:       Per HPI, otherwise negative  Respiratory:       Per HPI, otherwise negative  Cardiovascular:       Per HPI, otherwise negative  Gastrointestinal: Negative for vomiting.  Endocrine:       Negative aside from HPI    Genitourinary:       Neg aside from HPI   Musculoskeletal:       Per HPI, otherwise negative  Skin: Negative.   Neurological: Negative for syncope and weakness.     Physical Exam Updated Vital Signs BP (!) 135/55   Pulse 63   Temp 98.1 F (36.7 C) (Oral)   Resp 18   SpO2 97%   Physical Exam  Constitutional: She is oriented to person, place, and time. She appears well-developed and well-nourished. No distress.  HENT:  Head: Normocephalic and atraumatic.  Eyes: Conjunctivae and EOM are normal.  Cardiovascular: Normal rate and regular rhythm.   Pulmonary/Chest: Effort normal and breath sounds normal. No stridor. No respiratory distress.  Abdominal: She exhibits no distension.  Musculoskeletal: She exhibits no edema.       Legs: Neurological: She is alert and oriented to person, place, and time. No cranial nerve deficit.  Skin: Skin is warm and dry.  Psychiatric: She has a normal mood and affect.  Nursing note and vitals reviewed.    ED Treatments / Results   Radiology Dg Lumbar Spine 2-3 Views  Result Date: 08/21/2016 CLINICAL DATA:  Right-sided hip pain for several weeks, remote history of fall, initial encounter EXAM: LUMBAR SPINE - 3 VIEW COMPARISON:  02/11/2014 FINDINGS: Stable anterolisthesis of L4 on L5 is noted. Changes of prior vertebral augmentation are from T11-L3. Pedicle screws are noted with posterior fixation. The overall appearance is stable. No definitive hardware failure is seen. No soft tissue abnormality is noted. IMPRESSION: Postsurgical changes as described. Stable anterolisthesis of L4 on L5. Electronically Signed   By: Alcide Clever M.D.   On: 08/21/2016 18:47   Ct Pelvis Wo Contrast  Result Date: 08/21/2016 CLINICAL DATA:  Acute tail bone pain after fall EXAM: CT PELVIS WITHOUT CONTRAST TECHNIQUE: Multidetector CT imaging of the pelvis was performed following the standard protocol without intravenous contrast. COMPARISON:  Same day radiographs of the  lumbar spine and hip FINDINGS: Urinary Tract: Unremarkable appearance of the bladder. No distal uropathy. Bowel: Scattered colonic diverticulosis along the sigmoid colon without bowel obstruction or  diverticulitis. Vascular/Lymphatic: Aortoiliac atherosclerosis without aneurysm. No adenopathy. Reproductive:  Hysterectomy.  No adnexal mass. Other:  None Musculoskeletal: Partially visualized lumbar spinal fusion hardware at L3 with anterolisthesis grade 1 of L4 on L5 associated with degenerative disc disease. Partially calcified disc noted at L5-S1. Lumbar degenerative facet arthropathy from L3 through S1. No acute fracture identified of the sacrum or coccyx. Posterior soft tissue swelling consistent with a soft tissue contusion overlying the lumbosacral junction. IMPRESSION: 1. Soft tissue contusion at the lumbosacral juncture posteriorly. 2. No acute fracture of the sacrum or coccyx. 3. Lumbar spinal fusion hardware partially imaged at L3 without acute hardware failure. 4. Lower lumbar degenerative disc and facet arthropathy from L4 through S1. Electronically Signed   By: Tollie Eth M.D.   On: 08/21/2016 21:23   Dg Hip Unilat  With Pelvis 2-3 Views Right  Result Date: 08/21/2016 CLINICAL DATA:  Right-sided hip pain for several weeks, remote history of injury, initial encounter EXAM: DG HIP (WITH OR WITHOUT PELVIS) 2-3V RIGHT COMPARISON:  None. FINDINGS: The pelvic ring is intact. Postsurgical changes in the proximal left femur are noted. Mild degenerative changes of the hip joints are seen bilaterally. No definitive fracture or dislocation is seen. IMPRESSION: No acute abnormality noted. Degenerative change of the hip joint is seen. Electronically Signed   By: Alcide Clever M.D.   On: 08/21/2016 18:43    Procedures Procedures (including critical care time)  Medications Ordered in ED Medications  ketorolac (TORADOL) 30 MG/ML injection 15 mg (not administered)  fentaNYL (SUBLIMAZE) injection 50 mcg (not  administered)     Initial Impression / Assessment and Plan / ED Course  I have reviewed the triage vital signs and the nursing notes.  Pertinent labs & imaging results that were available during my care of the patient were reviewed by me and considered in my medical decision making (see chart for details).  Update: After the initial x-ray has resulted I evaluated the patient again, she continues to complain of severe pain. With consideration of occult fracture, CT scan will be performed.  Update:, Now, CT scan does not demonstrate pelvic fracture, hip fracture, the patient continued to have severe pain, is unable to bear weight, or move from the bed.   Update: Patient has received additional analgesia, as well as steroids, remains essentially incapacitated for pain, likely radiculopathy, possible sacroiliitis. No other new complex, including abdominal pain, fever, low suspicion for atypical infection. Given the patient's inability to bear weight or walk, I discussed with her benefit of rehabilitation placement. Patient is amenable to this, but given the hour of the day, day of the week, the patient will require waiting overnight for evaluation, assistance with placement.   Final Clinical Impressions(s) / ED Diagnoses  Hip pain Inability to ambulate.   Gerhard Munch, MD 08/21/16 [phone]

## 2016-08-21 NOTE — ED Notes (Signed)
Patient transported to X-ray 

## 2016-08-21 NOTE — ED Triage Notes (Addendum)
Pt reports 3 weeks of right sided hip pain with pain radiating down the leg. Denies fall recently but reports fall 4 weeks ago.

## 2016-08-21 NOTE — ED Notes (Signed)
Pt requested to use the BSC. attemted to get pt up but pt began to cry in pain. Another RN came to assist pt with bedpan. Pt was unable to get on bed pan and told both RN to leave her alone. EDP made aware

## 2016-08-22 DIAGNOSIS — M25551 Pain in right hip: Secondary | ICD-10-CM | POA: Diagnosis not present

## 2016-08-22 MED ORDER — TRAMADOL HCL 50 MG PO TABS
50.0000 mg | ORAL_TABLET | Freq: Once | ORAL | Status: AC
  Administered 2016-08-22: 50 mg via ORAL
  Filled 2016-08-22: qty 1

## 2016-08-22 NOTE — ED Notes (Signed)
Lunch tray ordered 

## 2016-08-22 NOTE — ED Notes (Addendum)
PT at bedside.

## 2016-08-22 NOTE — ED Provider Notes (Signed)
Patient is aware that she has no admission of low diagnosis of that is willing to go home. She has a bedside commode, wheelchair and walker. Case management has been involved in her care. I consulted PT and OT to assess patient to make certain that she is safe at home..  PT and OT have evaluated patient and agree that she is stable for discharge to go home. Patient is in agreement to go home. She was able to walk with her walker while here. Face-to-face evaluation for PT OT at home nursing has been ordered by me. She does report that she has adequate pain medication to last her for the next few days. She is instructed to call her physician if she needs refills for pain medications   Doug SouJacubowitz, Maribeth Jiles, MD 08/22/16 1242

## 2016-08-22 NOTE — Evaluation (Signed)
Occupational Therapy Evaluation and Discharge Patient Details Name: [name] MRN: [id number] DOB: Sep 02, 1945 Today's Date: 08/22/2016    History of Present Illness Patient presents with concern of right hip and back pain. PHMx: abnormality of gait, anxiety, CHF, depression, arthritis, PVD, HTN   Clinical Impression   This 71 yo female admitted with above presents to acute OT with deficits below. She would benefit from a short stay at [facility] but pt does not want to do this nor does her hospital stay qualify her to. Thus I am recommending HHOT and HHAid. We will D/C from acute OT for pt is to D/C today.    Follow Up Recommendations  Home health OT;Supervision - Intermittent;Other (comment) (HHAide)    Equipment Recommendations  None recommended by OT       Precautions / Restrictions Precautions Precautions: None Restrictions Weight Bearing Restrictions: No      Mobility  Transfers Overall transfer level: Needs assistance Equipment used: Rolling walker (2 wheeled) Transfers: Sit to/from Stand;Stand Pivot Transfers Sit to Stand: Supervision Stand pivot transfers: Supervision            Balance Overall balance assessment: Needs assistance Sitting-balance support: No upper extremity supported;Feet supported Sitting balance-Leahy Scale: Good     Standing balance support: Bilateral upper extremity supported;During functional activity Standing balance-Leahy Scale: Poor Standing balance comment: reliant on RW                           ADL either performed or assessed with clinical judgement   ADL Overall ADL's : At baseline                                       General ADL Comments: Mod I with increased time     Vision Patient Visual Report: No change from baseline              Pertinent Vitals/Pain Pain Assessment: No/denies pain     Hand Dominance Right   Extremity/Trunk Assessment Upper Extremity Assessment Upper Extremity  Assessment: Generalized weakness           Communication Communication Communication: No difficulties   Cognition Arousal/Alertness: Awake/alert Behavior During Therapy: WFL for tasks assessed/performed Overall Cognitive Status: Within Functional Limits for tasks assessed                                                Home Living Family/patient expects to be discharged to:: Private residence Living Arrangements: Alone Available Help at Discharge: Friend(s);Family;Available PRN/intermittently Type of Home: House                 Bathroom Toilet: Standard     Home Equipment: Environmental consultant - 2 wheels;Shower seat;Grab bars - toilet          Prior Functioning/Environment Level of Independence: Independent with assistive device(s)        Comments: Pt uses RW at home; report she can bath and dress herself with increased time        OT Problem List: Decreased strength;Impaired balance (sitting and/or standing)         OT Goals(Current goals can be found in the care plan section) Acute Rehab OT Goals Patient Stated Goal: to go home  OT  Frequency:             Co-evaluation PT/OT/SLP Co-Evaluation/Treatment: Yes (partial) Reason for Co-Treatment: To address functional/ADL transfers   OT goals addressed during session: ADL's and self-care;Strengthening/ROM      AM-PAC PT "6 Clicks" Daily Activity     Outcome Measure Help from another person eating meals?: None Help from another person taking care of personal grooming?: None Help from another person toileting, which includes using toliet, bedpan, or urinal?: A Little Help from another person bathing (including washing, rinsing, drying)?: A Little Help from another person to put on and taking off regular upper body clothing?: None Help from another person to put on and taking off regular lower body clothing?: A Little 6 Click Score: 21   End of Session Equipment Utilized During Treatment: Gait  belt;Rolling walker Nurse Communication: Mobility status  Activity Tolerance: Patient tolerated treatment well Patient left:  (in W/C)  OT Visit Diagnosis: Unsteadiness on feet (R26.81);History of falling (Z91.81)                Time: 1308-65781214-1230 OT Time Calculation (min): 16 min Charges:    G-Codes: OT G-codes **NOT FOR INPATIENT CLASS** Functional Assessment Tool Used: Clinical judgement Functional Limitation: Self care Self Care Current Status (I6962(G8987): At least 1 percent but less than 20 percent impaired, limited or restricted Self Care Goal Status (X5284(G8988): At least 1 percent but less than 20 percent impaired, limited or restricted Self Care Discharge Status [phone](G8989): At least 1 percent but less than 20 percent impaired, limited or restricted     Evette GeorgesLeonard, Mialee Weyman Eva [phone] 08/22/2016, 1:19 PM

## 2016-08-22 NOTE — Evaluation (Signed)
One time Physical Therapy Evaluation Patient Details Name: [name] PerchesJulia G Mini MRN: [id number] DOB: 03/27/1945 Today's Date: 08/22/2016   History of Present Illness  Patient presents with concern of right hip and back pain. PHMx: abnormality of gait, anxiety, CHF, depression, arthritis, PVD, HTN  Clinical Impression  Pt admitted with above diagnosis. Pt currently with functional limitations due to the deficits listed below (see PT Problem List). Pt appears at her baseline.  Has certain ways of doing things and shows PT and OT she can take care of basic needss with incr time.  Pt has equipment and wants to go home therefore recommend HH f/u as below.  Pt d/c today so no goals set.    Follow Up Recommendations Home health PT [facility](HHOT [facility])    Equipment Recommendations  Other (comment) (platforms for RW may benefit but [facility] due to her RW is at home and height will make a difference)    Recommendations for Other Services       Precautions / Restrictions Precautions Precautions: None Restrictions Weight Bearing Restrictions: No      Mobility  Bed Mobility Overal bed mobility: Needs Assistance Bed Mobility: Supine to Sit     Supine to sit: Mod assist;+2 for physical assistance     General bed mobility comments: Difficult to get off stretcher but feel that pt is short and stretcher was really high.  May not have been as difficult if pt had not been on high stretcher.  Transfers Overall transfer level: Needs assistance Equipment used: Rolling walker (2 wheeled) Transfers: Sit to/from Stand Sit to Stand: Supervision Stand pivot transfers: Supervision          Ambulation/Gait Ambulation/Gait assistance: Supervision Ambulation Distance (Feet): 8 Feet Assistive device: Rolling walker (2 wheeled) Gait Pattern/deviations: Step-to pattern;Decreased stride length;Decreased step length - right;Decreased step length - left;Antalgic;Drifts right/left;Wide base of support    Gait velocity interpretation: Below normal speed for age/gender General Gait Details: Pt was able to ambulate with RW but very flexed posture with pt bent almost to 90 degrees trunk flexion with pt leaning on left elbow and right elbow.  Pt would not stand up with cues.  Pt states she is at baseline.  Was able to function without hands on.  Took pt to bathroom.  Pt was wet as she wears Depends at home.  OT arrived to see pt - see her note regarding clothing management.  Stairs Stairs:  (Refused to practice)          Engineer, drillingWheelchair Mobility    Modified Rankin (Stroke Patients Only)       Balance Overall balance assessment: Needs assistance Sitting-balance support: No upper extremity supported;Feet supported Sitting balance-Leahy Scale: Good     Standing balance support: Bilateral upper extremity supported;During functional activity Standing balance-Leahy Scale: Poor Standing balance comment: reliant on RW                             Pertinent Vitals/Pain Pain Assessment: 0-10 Pain Score: 4  Pain Location: left hip Pain Descriptors / Indicators: Aching Pain Intervention(s): Limited activity within patient's tolerance;Monitored during session;Premedicated before session;Repositioned    Home Living Family/patient expects to be discharged to:: Private residence Living Arrangements: Alone Available Help at Discharge: Friend(s);Family;Available PRN/intermittently Type of Home: House Home Access: Stairs to enter Entrance Stairs-Rails: Right Entrance Stairs-Number of Steps: 2   Home Equipment: Walker - 2 wheels;Shower seat;Grab bars - toilet Additional Comments: States  she has help getting in steps.    Prior Function Level of Independence: Independent with assistive device(s)         Comments: Pt uses RW at home; report she can bath and dress herself with increased time     Hand Dominance   Dominant Hand: Right    Extremity/Trunk Assessment   Upper  Extremity Assessment Upper Extremity Assessment: Defer to OT evaluation    Lower Extremity Assessment Lower Extremity Assessment: Generalized weakness    Cervical / Trunk Assessment Cervical / Trunk Assessment: Kyphotic  Communication   Communication: No difficulties  Cognition Arousal/Alertness: Lethargic;Suspect due to medications initially but then perked up once up. Behavior During Therapy: WFL for tasks assessed/performed Overall Cognitive Status: Within Functional Limits for tasks assessed                                        General Comments      Exercises     Assessment/Plan    PT Assessment Patient needs continued PT services  PT Problem List Decreased strength;Decreased activity tolerance;Decreased balance;Decreased mobility;Decreased coordination;Decreased cognition;Decreased knowledge of use of DME;Decreased safety awareness;Pain       PT Treatment Interventions DME instruction;Gait training;Functional mobility training;Therapeutic activities;Therapeutic exercise;Patient/family education    PT Goals (Current goals can be found in the Care Plan section)  Acute Rehab PT Goals Patient Stated Goal: to go home PT Goal Formulation: All assessment and education complete, DC therapy    Frequency     Barriers to discharge Decreased caregiver support      Co-evaluation PT/OT/SLP Co-Evaluation/Treatment: Yes Reason for Co-Treatment: For patient/therapist safety PT goals addressed during session: Mobility/safety with mobility OT goals addressed during session: ADL's and self-care;Strengthening/ROM       AM-PAC PT "6 Clicks" Daily Activity  Outcome Measure Difficulty turning over in bed (including adjusting bedclothes, sheets and blankets)?: None Difficulty moving from lying on back to sitting on the side of the bed? : A Lot Difficulty sitting down on and standing up from a chair with arms (e.g., wheelchair, bedside commode, etc,.)?: None Help  needed moving to and from a bed to chair (including a wheelchair)?: None Help needed walking in hospital room?: None Help needed climbing 3-5 steps with a railing? : A Little 6 Click Score: 21    End of Session Equipment Utilized During Treatment: Gait belt Activity Tolerance: Patient limited by fatigue Patient left: in chair;with call bell/phone within reach;with nursing/sitter in room Nurse Communication: Mobility status PT Visit Diagnosis: Unsteadiness on feet (R26.81);Muscle weakness (generalized) (M62.81);Pain Pain - Right/Left: Left Pain - part of body: Leg    Time: 1610-9604 PT Time Calculation (min) (ACUTE ONLY): 34 min   Charges:   PT Evaluation $PT Eval Moderate Complexity: 1 Procedure     PT G Codes:   PT G-Codes **NOT FOR INPATIENT CLASS** Functional Assessment Tool Used: AM-PAC 6 Clicks Basic Mobility Functional Limitation: Mobility: Walking and moving around Mobility: Walking and Moving Around Current Status (V4098): At least 1 percent but less than 20 percent impaired, limited or restricted Mobility: Walking and Moving Around Goal Status [phone]): At least 1 percent but less than 20 percent impaired, limited or restricted Mobility: Walking and Moving Around Discharge Status [phone]): At least 1 percent but less than 20 percent impaired, limited or restricted    [facility] Acute Rehabilitation [phone] (pager)   Berline Lopes 08/22/2016, 2:31 PM

## 2016-08-22 NOTE — ED Notes (Signed)
Pt. Resistant to ambulation due to right hip pain. Pt. sts she is having muscle spasms. RN and tech assisted patient to restroom in wheelchair.

## 2016-08-22 NOTE — ED Notes (Signed)
PT and OT re-paged for consult and eval

## 2016-08-22 NOTE — ED Notes (Signed)
[facility] called for patient transport, scheduled for 6pm as her friend has her house key and won't be available until that time.

## 2016-08-22 NOTE — Discharge Planning (Signed)
Kathya Wilz J. Lucretia RoersWood, RN, BSN, [employer] [phone] Spoke with pt at bedside regarding [facility]. Offered pt list of home health agencies to choose from.  [facility]. [facility] Adamrew [facility] notified. DME needs identified at this time include walker platforms.  Platforms ordered through [facility].

## 2016-08-22 NOTE — Discharge Instructions (Signed)
You will have visits arranged to come to your home to evaluate you for further assistance to help you get around at home and to make your home safer. If you need refills for any of your medications, please contact your physician. Return if concern for any reason

## 2016-08-22 NOTE — ED Notes (Signed)
Pt given meal and Sprite Zero per EDP. Pt states she wears CPAP at night. Pt placed in hallway on 2L for SpO2 dropping as pt falls asleep. Pt hooked up to dinamap in hallway. Will continue to monitor.

## 2016-08-22 NOTE — Care Management Note (Signed)
Case Management Note  Patient Details  Name: [name] PerchesJulia G [name] MRN: [id number] Date of Birth: 04/09/1945  Subjective/Objective:                  71 y.o. female patient presents with concern of hip and back pain. Initially the patient denies history of recent fall, but it seems as though about one month ago the patient had a unremarkable fall.  Action/Plan: Follow for disposition needs.   Expected Discharge Date:  08/22/16               Expected Discharge Plan:  Home/Self Care  In-House Referral:  Clinical Social Work  Discharge planning Services  CM Consult  Post Acute Care Choice:    Choice offered to:     DME Arranged:    DME Agency:     HH Arranged:    HH Agency:     Status of Service:  In process, will continue to follow  If discussed at Long Length of Stay Meetings, dates discussed:    Additional Comments: EDCM consulted regarding HH vs SNF placement.  EDCM reviewed chart, pt insurance requires 3-day inpatient hospital stay within 30 days of placement of which, pt does not have.  Spoke with at bedside regarding discharge planning for Home Health Services vs SNF. Explained insurance requirements and inquired about ability to self pay of SNF.  Pt states she is not in a position to self pay at this time. EDP ordered PT to evaluate for safety at home. [facility] will [facility] recommendations from PT.    Oletta CohnWood, Jaylina Ramdass, RN 08/22/2016, 10:21 AM

## 2016-09-26 DIAGNOSIS — R296 Repeated falls: Secondary | ICD-10-CM

## 2016-09-26 DIAGNOSIS — I5023 Acute on chronic systolic (congestive) heart failure: Secondary | ICD-10-CM

## 2016-09-26 DIAGNOSIS — G9341 Metabolic encephalopathy: Secondary | ICD-10-CM

## 2016-09-26 DIAGNOSIS — R531 Weakness: Secondary | ICD-10-CM

## 2016-09-27 DIAGNOSIS — R296 Repeated falls: Secondary | ICD-10-CM | POA: Diagnosis not present

## 2016-09-27 DIAGNOSIS — I5023 Acute on chronic systolic (congestive) heart failure: Secondary | ICD-10-CM | POA: Diagnosis not present

## 2016-09-27 DIAGNOSIS — G9341 Metabolic encephalopathy: Secondary | ICD-10-CM | POA: Diagnosis not present

## 2016-09-27 DIAGNOSIS — R531 Weakness: Secondary | ICD-10-CM | POA: Diagnosis not present

## 2016-09-28 DIAGNOSIS — R531 Weakness: Secondary | ICD-10-CM | POA: Diagnosis not present

## 2016-09-28 DIAGNOSIS — R296 Repeated falls: Secondary | ICD-10-CM | POA: Diagnosis not present

## 2016-09-28 DIAGNOSIS — G9341 Metabolic encephalopathy: Secondary | ICD-10-CM | POA: Diagnosis not present

## 2016-09-28 DIAGNOSIS — I5023 Acute on chronic systolic (congestive) heart failure: Secondary | ICD-10-CM | POA: Diagnosis not present

## 2016-09-29 DIAGNOSIS — R531 Weakness: Secondary | ICD-10-CM | POA: Diagnosis not present

## 2016-09-29 DIAGNOSIS — G9341 Metabolic encephalopathy: Secondary | ICD-10-CM | POA: Diagnosis not present

## 2016-09-29 DIAGNOSIS — R296 Repeated falls: Secondary | ICD-10-CM | POA: Diagnosis not present

## 2016-09-29 DIAGNOSIS — I5023 Acute on chronic systolic (congestive) heart failure: Secondary | ICD-10-CM | POA: Diagnosis not present

## 2016-11-16 DIAGNOSIS — Z9181 History of falling: Secondary | ICD-10-CM | POA: Insufficient documentation

## 2016-12-27 ENCOUNTER — Other Ambulatory Visit: Payer: Self-pay | Admitting: Nurse Practitioner

## 2016-12-27 NOTE — Telephone Encounter (Signed)
Patient requesting refill of clonazePAM (KLONOPIN) 0.5 MG tablet and pregabalin (LYRICA) 100 MG capsule called to [facility] in [place] Tel# [phone].

## 2016-12-27 NOTE — Addendum Note (Signed)
Addended by: Guy Begin on: 12/27/2016 03:28 PM   Modules accepted: Orders

## 2016-12-28 ENCOUNTER — Other Ambulatory Visit: Payer: Self-pay | Admitting: Nurse Practitioner

## 2016-12-28 MED ORDER — CLONAZEPAM 0.5 MG PO TABS: 0.5000 mg | ORAL_TABLET | Freq: Two times a day (BID) | ORAL | 1 refills | Status: AC

## 2016-12-28 MED ORDER — PREGABALIN 100 MG PO CAPS: ORAL_CAPSULE | ORAL | 1 refills | Status: DC

## 2016-12-28 NOTE — Telephone Encounter (Signed)
Fax confirmation received [facility] [phone]  Clonazepam and pregabalin. sy

## 2017-01-07 DIAGNOSIS — R001 Bradycardia, unspecified: Secondary | ICD-10-CM

## 2017-01-07 DIAGNOSIS — I10 Essential (primary) hypertension: Secondary | ICD-10-CM | POA: Diagnosis not present

## 2017-01-07 DIAGNOSIS — K219 Gastro-esophageal reflux disease without esophagitis: Secondary | ICD-10-CM | POA: Diagnosis not present

## 2017-01-07 DIAGNOSIS — E785 Hyperlipidemia, unspecified: Secondary | ICD-10-CM | POA: Diagnosis not present

## 2017-01-07 DIAGNOSIS — T148XXA Other injury of unspecified body region, initial encounter: Secondary | ICD-10-CM

## 2017-01-07 DIAGNOSIS — J45909 Unspecified asthma, uncomplicated: Secondary | ICD-10-CM

## 2017-01-07 DIAGNOSIS — L03116 Cellulitis of left lower limb: Secondary | ICD-10-CM

## 2017-01-08 DIAGNOSIS — L03116 Cellulitis of left lower limb: Secondary | ICD-10-CM | POA: Diagnosis not present

## 2017-01-08 DIAGNOSIS — E785 Hyperlipidemia, unspecified: Secondary | ICD-10-CM | POA: Diagnosis not present

## 2017-01-08 DIAGNOSIS — R001 Bradycardia, unspecified: Secondary | ICD-10-CM | POA: Diagnosis not present

## 2017-01-08 DIAGNOSIS — J45909 Unspecified asthma, uncomplicated: Secondary | ICD-10-CM | POA: Diagnosis not present

## 2017-01-08 DIAGNOSIS — K219 Gastro-esophageal reflux disease without esophagitis: Secondary | ICD-10-CM | POA: Diagnosis not present

## 2017-01-08 DIAGNOSIS — T148XXA Other injury of unspecified body region, initial encounter: Secondary | ICD-10-CM | POA: Diagnosis not present

## 2017-01-08 DIAGNOSIS — I10 Essential (primary) hypertension: Secondary | ICD-10-CM | POA: Diagnosis not present

## 2017-01-09 DIAGNOSIS — E785 Hyperlipidemia, unspecified: Secondary | ICD-10-CM | POA: Diagnosis not present

## 2017-01-09 DIAGNOSIS — L03116 Cellulitis of left lower limb: Secondary | ICD-10-CM | POA: Diagnosis not present

## 2017-01-09 DIAGNOSIS — J45909 Unspecified asthma, uncomplicated: Secondary | ICD-10-CM | POA: Diagnosis not present

## 2017-01-09 DIAGNOSIS — K219 Gastro-esophageal reflux disease without esophagitis: Secondary | ICD-10-CM | POA: Diagnosis not present

## 2017-01-09 DIAGNOSIS — T148XXA Other injury of unspecified body region, initial encounter: Secondary | ICD-10-CM | POA: Diagnosis not present

## 2017-01-09 DIAGNOSIS — R001 Bradycardia, unspecified: Secondary | ICD-10-CM | POA: Diagnosis not present

## 2017-01-09 DIAGNOSIS — I10 Essential (primary) hypertension: Secondary | ICD-10-CM | POA: Diagnosis not present

## 2017-01-10 DIAGNOSIS — J45909 Unspecified asthma, uncomplicated: Secondary | ICD-10-CM | POA: Diagnosis not present

## 2017-01-10 DIAGNOSIS — R001 Bradycardia, unspecified: Secondary | ICD-10-CM | POA: Diagnosis not present

## 2017-01-10 DIAGNOSIS — E785 Hyperlipidemia, unspecified: Secondary | ICD-10-CM | POA: Diagnosis not present

## 2017-01-10 DIAGNOSIS — T148XXA Other injury of unspecified body region, initial encounter: Secondary | ICD-10-CM | POA: Diagnosis not present

## 2017-01-10 DIAGNOSIS — I10 Essential (primary) hypertension: Secondary | ICD-10-CM | POA: Diagnosis not present

## 2017-01-10 DIAGNOSIS — L03116 Cellulitis of left lower limb: Secondary | ICD-10-CM | POA: Diagnosis not present

## 2017-01-10 DIAGNOSIS — K219 Gastro-esophageal reflux disease without esophagitis: Secondary | ICD-10-CM | POA: Diagnosis not present

## 2017-03-24 ENCOUNTER — Telehealth: Payer: Self-pay

## 2017-03-24 NOTE — Telephone Encounter (Signed)
I received a prior auth request for Lyrica 100mg . I have completed and submitted the PA and should have a determination within 48-72 hours.  [name]: [id number] - PA Case : [id number]: [id number]   APPROVED.  CaseId:[id number];Status:Approved;Review Type:Qty;Coverage Start Date:02/22/2017;Coverage End Date:03/24/2018;

## 2017-05-10 ENCOUNTER — Ambulatory Visit: Payer: Self-pay | Admitting: Cardiology

## 2017-05-16 ENCOUNTER — Telehealth: Payer: Self-pay | Admitting: Cardiology

## 2017-05-16 NOTE — Telephone Encounter (Signed)
[facility]. Patient states she had an accident at home and had heart attack in hospital and she wants doctor to see records.

## 2017-05-17 ENCOUNTER — Ambulatory Visit (INDEPENDENT_AMBULATORY_CARE_PROVIDER_SITE_OTHER): Payer: Medicare Other | Admitting: Cardiology

## 2017-05-17 ENCOUNTER — Encounter: Payer: Self-pay | Admitting: Cardiology

## 2017-05-17 ENCOUNTER — Other Ambulatory Visit: Payer: Self-pay

## 2017-05-17 VITALS — BP 128/80 | HR 59 | Ht 60.0 in

## 2017-05-17 DIAGNOSIS — I10 Essential (primary) hypertension: Secondary | ICD-10-CM | POA: Diagnosis not present

## 2017-05-17 DIAGNOSIS — I509 Heart failure, unspecified: Secondary | ICD-10-CM | POA: Diagnosis not present

## 2017-05-17 DIAGNOSIS — I420 Dilated cardiomyopathy: Secondary | ICD-10-CM | POA: Diagnosis not present

## 2017-05-17 DIAGNOSIS — Z950 Presence of cardiac pacemaker: Secondary | ICD-10-CM

## 2017-05-17 NOTE — Telephone Encounter (Signed)
Records available for Dr. Bing MatterKrasowski to review.

## 2017-05-17 NOTE — Progress Notes (Signed)
Cardiology Office Note:    Date:  05/17/2017   ID:  [name], DOB 29-Jan-1946, MRN [id number]  PCP:  Gordan Payment., MD  Cardiologist:  Gypsy Balsam, MD    Referring MD: Gordan Payment., MD   Chief Complaint  Patient presents with  . Follow-up  . Medical Clearance  Doing well cardiac wise  History of Present Illness:    [name] is a 72 y.o. 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  Overall she seems to be doing well.  She is here because she required some dental work that will involve doing some bridging as well as crown.  Does have any chest pain tightness squeezing pressure burning chest no shortness of breath but she admits that she does not do much.  [place] she was brought to my room on [employer].  Past Medical History:  Diagnosis Date  . Abnormality of gait 10/25/2012  . Ankle fracture, right   . Anxiety   . Arthritis   . Asthma   . Cataracts, bilateral   . CHF (congestive heart failure) (HCC) 08/15/2012  . Compression fracture of L1 lumbar vertebra (HCC)   . Degenerative arthritis   . Depression   . Dyslipidemia   . GERD (gastroesophageal reflux disease)   . Glaucoma   . Hypertension   . Obesity   . Obstructive lung disease (HCC) 11/18/2014  . Peripheral neuropathy    Possible small fiber neuropathy  . Peripheral vascular disease (HCC)   . Polyneuropathy in other diseases classified elsewhere (HCC) 10/25/2012  . Sleep apnea    CPAP    Past Surgical History:  Procedure Laterality Date  . ABDOMINAL HYSTERECTOMY    . Arthroscopic surgery     Left knee  . BACK SURGERY    . BLADDER SUSPENSION     x3  . CATARACT EXTRACTION Bilateral   . CHOLECYSTECTOMY    . EXTENSOR TENDON OF FOREARM / WRIST REPAIR    . FEMUR IM NAIL Left 06/20/2012   Procedure: INTRAMEDULLARY (IM) NAIL FEMORAL;  Surgeon: Verlee Rossetti, MD;  Location: [facility] OR;  Service: Orthopedics;  Laterality: Left;  . HAND / FINGER LESION EXCISION    . IM NAILING FEMORAL  SHAFT FRACTURE Right 06/20/2012   Dr Sunnie Nielsen  . knee arhtroscopy    . KYPHOPLASTY    . pace maker  09-21-14   [facility]  . PACEMAKER IMPLANT  2017  . right forearm incision and drainage    . TONSILLECTOMY    . UMBILICAL HERNIA REPAIR      Current Medications: Current Meds  Medication Sig  . albuterol (ACCUNEB) 0.63 MG/3ML nebulizer solution Take 3 mLs (0.63 mg total) by nebulization every 6 (six) hours as needed for Wheezing.  . ALPHAGAN P 0.1 % SOLN   . atorvastatin (LIPITOR) 20 MG tablet Take 20 mg by mouth every evening.   . baclofen (LIORESAL) 10 MG tablet Take 1 tablet (10 mg total) by mouth at bedtime.  . budesonide-formoterol (SYMBICORT) 160-4.5 MCG/ACT inhaler Inhale 1-2 puffs into the lungs See admin instructions. 2 puffs every morning and 1 puff every evening  . bumetanide (BUMEX) 2 MG tablet Take 4 mg by mouth daily. For swelling  . carvedilol (COREG) 3.125 MG tablet Take by mouth.  . celecoxib (CELEBREX) 200 MG capsule Take by mouth.  . cetirizine (ZYRTEC) 10 MG tablet Take 10 mg by mouth daily.  . ciclesonide (OMNARIS) 50 MCG/ACT nasal spray Place 1 spray into both nostrils  2 (two) times daily.  . clindamycin (CLEOCIN) 300 MG capsule   . clonazePAM (KLONOPIN) 0.5 MG tablet Take 1 tablet (0.5 mg total) by mouth 2 (two) times daily.  Marland Kitchen. dexlansoprazole (DEXILANT) 60 MG capsule Take 60 mg by mouth every evening.   Marland Kitchen. FLUoxetine (PROZAC) 20 MG tablet Take 40 mg by mouth daily.   . fluticasone (FLONASE) 50 MCG/ACT nasal spray Place 2 sprays into the nose 2 (two) times daily as needed for allergies.   . furosemide (LASIX) 40 MG tablet Take by mouth.  . furosemide (LASIX) 40 MG tablet   . HYDROcodone-acetaminophen (NORCO) 10-325 MG per tablet Take 1 tablet by mouth every 6 (six) hours as needed for moderate pain.   Marland Kitchen. ipratropium (ATROVENT) 0.06 % nasal spray Place 2 sprays into both nostrils 4 (four) times daily as needed for rhinitis.  Marland Kitchen. ipratropium-albuterol (DUONEB) 0.5-2.5 (3)  MG/3ML SOLN Take 3 mLs by nebulization every 2 hours. Use every 2 hours as needed  . lamoTRIgine (LAMICTAL) 150 MG tablet Take 75-150 mg by mouth See admin instructions. Take 150 mg every morning and 75 mg every evening  . levalbuterol (XOPENEX HFA) 45 MCG/ACT inhaler Inhale 1-2 puffs into the lungs every 4 (four) hours as needed. For shortness of breath  . lisinopril (PRINIVIL,ZESTRIL) 10 MG tablet Take 10 mg by mouth daily.   Marland Kitchen. LUMIGAN 0.01 % SOLN   . nisoldipine (SULAR) 17 MG 24 hr tablet Take 17 mg by mouth every morning.    . nystatin cream (MYCOSTATIN)   . oxybutynin (DITROPAN) 5 MG tablet Take 5 mg by mouth daily.   Marland Kitchen. oxyCODONE-acetaminophen (PERCOCET) 10-325 MG tablet TK 1 T PO QID PRN  . potassium chloride 20 MEQ/15ML (10%) SOLN Take 40 mEq by mouth every evening. Mixed in water. Takes after evening meal so it won't burn stomach  . pregabalin (LYRICA) 100 MG capsule Take 1 capsule every morning and 4 capsules every evening.  . traZODone (DESYREL) 100 MG tablet Take 100 mg by mouth at bedtime.  . Vitamin D, Ergocalciferol, (DRISDOL) 50000 units CAPS capsule Take by mouth.  Marland Kitchen. ZIOPTAN 0.0015 % SOLN Place 1 drop into both eyes daily.      Allergies:   Latex; Peanuts [peanut oil]; Sulfa antibiotics; Metoprolol; Paxil [paroxetine hcl]; Benzalkonium chloride; Neosporin [neomycin-polymyxin-gramicidin]; and Penicillins   Social History   Socioeconomic History  . Marital status: Divorced    Spouse name: None  . Number of children: 1  . Years of education: 816  . Highest education level: None  Social Needs  . Financial resource strain: None  . Food insecurity - worry: None  . Food insecurity - inability: None  . Transportation needs - medical: None  . Transportation needs - non-medical: None  Occupational History  . Occupation: retired  Tobacco Use  . Smoking status: Never Smoker  . Smokeless tobacco: Never Used  Substance and Sexual Activity  . Alcohol use: Yes    Alcohol/week:  0.6 oz    Types: 1 Glasses of wine per week    Comment: daily  . Drug use: No  . Sexual activity: None  Other Topics Concern  . None  Social History Narrative   Patient lives at home with her cat.    Patient is divorced.    Patient has 1 child.    Patient is retired.    Patient has a college education.      Family History: The patient's family history includes Cancer in her father; Diabetes  in her maternal aunt; Heart disease in her mother; Parkinsonism in her cousin; Stroke in her mother. ROS:   Please see the history of present illness.    All 14 point review of systems negative except as described per history of present illness  EKGs/Labs/Other Studies Reviewed:      Recent Labs: No results found for requested labs within last 8760 hours.  Recent Lipid Panel    Component Value Date/Time   CHOL  01/30/2010 0855    133        ATP III CLASSIFICATION:  <200     mg/dL   Desirable  161-096  mg/dL   Borderline High  >=045    mg/dL   High          TRIG 409 (H) 01/30/2010 0855   HDL 55 01/30/2010 0855   CHOLHDL 2.4 01/30/2010 0855   VLDL 38 01/30/2010 0855   LDLCALC  01/30/2010 0855    40        Total Cholesterol/HDL:CHD Risk Coronary Heart Disease Risk Table                     Men   Women  1/2 Average Risk   3.4   3.3  Average Risk       5.0   4.4  2 X Average Risk   9.6   7.1  3 X Average Risk  23.4   11.0        Use the calculated Patient Ratio above and the CHD Risk Table to determine the patient's CHD Risk.        ATP III CLASSIFICATION (LDL):  <100     mg/dL   Optimal  811-914  mg/dL   Near or Above                    Optimal  130-159  mg/dL   Borderline  782-956  mg/dL   High  >213     mg/dL   Very High    Physical Exam:    VS:  BP 128/80 (BP Location: Right Arm, Patient Position: Sitting, Cuff Size: Normal)   Pulse (!) 59   Ht 5' (1.524 m)   SpO2 95%   BMI 41.72 kg/m     Wt Readings from Last 3 Encounters:  06/22/16 213 lb 9.6 oz (96.9 kg)    06/23/15 211 lb 8 oz (95.9 kg)  10/21/14 204 lb (92.5 kg)     GEN:  Well nourished, well developed in no acute distress HEENT: Normal NECK: No JVD; No carotid bruits LYMPHATICS: No lymphadenopathy CARDIAC: RRR, no murmurs, no rubs, no gallops RESPIRATORY:  Clear to auscultation without rales, wheezing or rhonchi  ABDOMEN: Soft, non-tender, non-distended MUSCULOSKELETAL:  No edema; No deformity  SKIN: Warm and dry LOWER EXTREMITIES: no swelling NEUROLOGIC:  Alert and oriented x 3 PSYCHIATRIC:  Normal affect   ASSESSMENT:    1. Congestive heart failure, unspecified HF chronicity, unspecified heart failure type (HCC)   2. Dilated cardiomyopathy (HCC)   3. Essential hypertension, benign   4. Pacemaker    PLAN:    In order of problems listed above:  1. Congestive heart failure with ejection fraction 40%.  On carvedilol which I will continue.  Also on ACE inhibitor.  Appears to be compensated. 2. Essential hypertension: Blood pressure well controlled continue present medications. 3. Pacemaker Medtronic device interrogated today normal function with good parameters.  Lead parameters are stable.   Medication Adjustments/Labs  and Tests Ordered: Current medicines are reviewed at length with the patient today.  Concerns regarding medicines are outlined above.  Orders Placed This Encounter  Procedures  . Basic metabolic panel  . Pro b natriuretic peptide (BNP)   Medication changes: No orders of the defined types were placed in this encounter.   Signed, Georgeanna Lea, MD, [facility] 05/17/2017 4:52 PM    [facility]

## 2017-05-17 NOTE — Patient Instructions (Signed)
Medication Instructions:  Your physician recommends that you continue on your current medications as directed. Please refer to the Current Medication list given to you today.  Labwork: Your physician recommends that you have the following labs drawn: BMP and BNP  Testing/Procedures: None  Follow-Up: Your physician recommends that you schedule a follow-up appointment in: 5 months  Any Other Special Instructions Will Be Listed Below (If Applicable).     If you need a refill on your cardiac medications before your next appointment, please call your pharmacy.   [facility]  Garey HamAshley A, RN, BSN

## 2017-05-18 LAB — BASIC METABOLIC PANEL
BUN / CREAT RATIO: 23 (ref 12–28)
BUN: 20 mg/dL (ref 8–27)
CHLORIDE: 106 mmol/L (ref 96–106)
CO2: 26 mmol/L (ref 20–29)
CREATININE: 0.86 mg/dL (ref 0.57–1.00)
Calcium: 9 mg/dL (ref 8.7–10.3)
GFR calc Af Amer: 78 mL/min/{1.73_m2} (ref >59)
GFR calc non Af Amer: 67 mL/min/{1.73_m2} (ref >59)
Glucose: 97 mg/dL (ref 65–99)
Potassium: 5 mmol/L (ref 3.5–5.2)
Sodium: 146 mmol/L — ABNORMAL HIGH (ref 134–144)

## 2017-05-18 LAB — PRO B NATRIURETIC PEPTIDE: NT-Pro BNP: 575 pg/mL — ABNORMAL HIGH (ref 0–301)

## 2017-05-18 NOTE — Addendum Note (Signed)
Addended by: Arville CareHUNT, Jaramie Bastos N on: 05/18/2017 11:58 AM   Modules accepted: Orders

## 2017-06-28 ENCOUNTER — Ambulatory Visit: Payer: Medicare Other | Admitting: Nurse Practitioner

## 2017-07-16 ENCOUNTER — Other Ambulatory Visit: Payer: Self-pay | Admitting: Nurse Practitioner

## 2017-07-17 ENCOUNTER — Other Ambulatory Visit: Payer: Self-pay | Admitting: *Deleted

## 2017-07-17 MED ORDER — PREGABALIN 100 MG PO CAPS: ORAL_CAPSULE | ORAL | 5 refills | Status: AC

## 2017-07-17 NOTE — Telephone Encounter (Signed)
Had to reprint lyrica prescription (as printed on regular paper)

## 2017-07-18 NOTE — Telephone Encounter (Signed)
Faxed printed/signed rx to [facility] at [phone]. Received fax confirmation.

## 2017-08-01 ENCOUNTER — Ambulatory Visit: Payer: Medicare Other | Admitting: Nurse Practitioner

## 2017-09-18 DEATH — deceased

## 2017-12-12 NOTE — Progress Notes (Deleted)
[facility]  PATIENT: [name] DOB: 05/23/1945   REASON FOR VISIT: Follow-up for peripheral neuropathy, tremor HISTORY FROM: Patient alone at visit    HISTORY OF PRESENT ILLNESS:[name] is a 72 year old right-handed white female with a history of obesity, degenerative arthritis of the knees, tremor, and peripheral neuropathy. The patient continues to do  relatively well with her symptoms. She indicates that her tremor really is not a problem until the afternoon, she has minimal tremor during the earlier parts of the day. She is on Lyrica taking 400 mg at night, 100 mg in the morning. She is taking some clonazepam during the day for the tremor. Recent sleep study confirmed obstructive sleep apnea and she is on CPAP at 8 cm. She claims she has much less fatigue and no daytime drowsiness. She uses a walker for ambulation, she has not had any falls. She reports significant discomfort in the knees with weightbearing, she has a Baker's cyst on the left that increases her discomfort. 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 She returns to this office for an evaluation.    REVIEW OF SYSTEMS: Full 14 system review of systems performed and notable only for those listed, all others are neg:  Constitutional: neg  Cardiovascular: neg Ear/Nose/Throat: neg  Skin: neg Eyes: neg Respiratory: neg Gastroitestinal: neg  Hematology/Lymphatic: Easy bruising  Endocrine: neg Musculoskeletal: Joint pain, walking difficulty Allergy/Immunology: neg Neurological: neg Psychiatric: Anxiety Sleep : neg   ALLERGIES: Allergies  Allergen Reactions  . Latex Hives  . Peanuts [Peanut Oil] Anaphylaxis and Swelling  . Sulfa Antibiotics Shortness Of Breath  . Metoprolol Other (See Comments)    unknown  . Paxil [Paroxetine Hcl]   . Benzalkonium Chloride Itching and Rash  . Neosporin [Neomycin-Polymyxin-Gramicidin] Rash  . Penicillins Rash    HOME  MEDICATIONS: Outpatient Medications Prior to Visit  Medication Sig Dispense Refill  . albuterol (ACCUNEB) 0.63 MG/3ML nebulizer solution Take 3 mLs (0.63 mg total) by nebulization every 6 (six) hours as needed for Wheezing.    . ALPHAGAN P 0.1 % SOLN     . atorvastatin (LIPITOR) 20 MG tablet Take 20 mg by mouth every evening.     . baclofen (LIORESAL) 10 MG tablet Take 1 tablet (10 mg total) by mouth at bedtime. 90 tablet 3  . budesonide-formoterol (SYMBICORT) 160-4.5 MCG/ACT inhaler Inhale 1-2 puffs into the lungs See admin instructions. 2 puffs every morning and 1 puff every evening    . bumetanide (BUMEX) 2 MG tablet Take 4 mg by mouth daily. For swelling    . carvedilol (COREG) 3.125 MG tablet Take by mouth.    . celecoxib (CELEBREX) 200 MG capsule Take by mouth.    . cetirizine (ZYRTEC) 10 MG tablet Take 10 mg by mouth daily.    . ciclesonide (OMNARIS) 50 MCG/ACT nasal spray Place 1 spray into both nostrils 2 (two) times daily.    . clindamycin (CLEOCIN) 300 MG capsule   0  . clonazePAM (KLONOPIN) 0.5 MG tablet Take 1 tablet (0.5 mg total) by mouth 2 (two) times daily. 180 tablet 1  . dexlansoprazole (DEXILANT) 60 MG capsule Take 60 mg by mouth every evening.     Marland Kitchen. FLUoxetine (PROZAC) 20 MG tablet Take 40 mg by mouth daily.     . fluticasone (FLONASE) 50 MCG/ACT nasal spray Place 2 sprays into the nose 2 (two) times daily as needed for allergies.     . furosemide (LASIX) 40 MG tablet  Take by mouth.    . furosemide (LASIX) 40 MG tablet     . HYDROcodone-acetaminophen (NORCO) 10-325 MG per tablet Take 1 tablet by mouth every 6 (six) hours as needed for moderate pain.     Marland Kitchen ipratropium (ATROVENT) 0.06 % nasal spray Place 2 sprays into both nostrils 4 (four) times daily as needed for rhinitis.    Marland Kitchen ipratropium-albuterol (DUONEB) 0.5-2.5 (3) MG/3ML SOLN Take 3 mLs by nebulization every 2 hours. Use every 2 hours as needed    . lamoTRIgine (LAMICTAL) 150 MG tablet Take 75-150 mg by mouth See  admin instructions. Take 150 mg every morning and 75 mg every evening    . levalbuterol (XOPENEX HFA) 45 MCG/ACT inhaler Inhale 1-2 puffs into the lungs every 4 (four) hours as needed. For shortness of breath    . lisinopril (PRINIVIL,ZESTRIL) 10 MG tablet Take 10 mg by mouth daily.     Marland Kitchen LUMIGAN 0.01 % SOLN     . nisoldipine (SULAR) 17 MG 24 hr tablet Take 17 mg by mouth every morning.      . nystatin cream (MYCOSTATIN)     . oxybutynin (DITROPAN) 5 MG tablet Take 5 mg by mouth daily.     Marland Kitchen oxyCODONE-acetaminophen (PERCOCET) 10-325 MG tablet TK 1 T PO QID PRN  0  . potassium chloride 20 MEQ/15ML (10%) SOLN Take 40 mEq by mouth every evening. Mixed in water. Takes after evening meal so it won't burn stomach    . pregabalin (LYRICA) 100 MG capsule TAKE 1 CAPSULE BY MOUTH EVERY MORNING AND 4 EVERY EVENING. 150 capsule 5  . traZODone (DESYREL) 100 MG tablet Take 100 mg by mouth at bedtime.    . Vitamin D, Ergocalciferol, (DRISDOL) 50000 units CAPS capsule Take by mouth.    Marland Kitchen ZIOPTAN 0.0015 % SOLN Place 1 drop into both eyes daily.      No facility-administered medications prior to visit.     PAST MEDICAL HISTORY: Past Medical History:  Diagnosis Date  . Abnormality of gait 10/25/2012  . Ankle fracture, right   . Anxiety   . Arthritis   . Asthma   . Cataracts, bilateral   . CHF (congestive heart failure) (HCC) 08/15/2012  . Compression fracture of L1 lumbar vertebra (HCC)   . Degenerative arthritis   . Depression   . Dyslipidemia   . GERD (gastroesophageal reflux disease)   . Glaucoma   . Hypertension   . Obesity   . Obstructive lung disease (HCC) 11/18/2014  . Peripheral neuropathy    Possible small fiber neuropathy  . Peripheral vascular disease (HCC)   . Polyneuropathy in other diseases classified elsewhere (HCC) 10/25/2012  . Sleep apnea    CPAP    PAST SURGICAL HISTORY: Past Surgical History:  Procedure Laterality Date  . ABDOMINAL HYSTERECTOMY    . Arthroscopic surgery      Left knee  . BACK SURGERY    . BLADDER SUSPENSION     x3  . CATARACT EXTRACTION Bilateral   . CHOLECYSTECTOMY    . EXTENSOR TENDON OF FOREARM / WRIST REPAIR    . FEMUR IM NAIL Left 06/20/2012   Procedure: INTRAMEDULLARY (IM) NAIL FEMORAL;  Surgeon: Verlee Rossetti, MD;  Location: [facility];  Service: Orthopedics;  Laterality: Left;  . HAND / FINGER LESION EXCISION    . IM NAILING FEMORAL SHAFT FRACTURE Right 06/20/2012   Dr Sunnie Nielsen  . knee arhtroscopy    . KYPHOPLASTY    . pace maker  09-21-14   [facility]  . PACEMAKER IMPLANT  2017  . right forearm incision and drainage    . TONSILLECTOMY    . UMBILICAL HERNIA REPAIR      FAMILY HISTORY: Family History  Problem Relation Age of Onset  . Diabetes Maternal Aunt   . Parkinsonism Cousin   . Heart disease Mother   . Stroke Mother   . Cancer Father     SOCIAL HISTORY: Social History   Socioeconomic History  . Marital status: Divorced    Spouse name: Not on file  . Number of children: 1  . Years of education: 79  . Highest education level: Not on file  Occupational History  . Occupation: retired  Engineer, production  . Financial resource strain: Not on file  . Food insecurity:    Worry: Not on file    Inability: Not on file  . Transportation needs:    Medical: Not on file    Non-medical: Not on file  Tobacco Use  . Smoking status: Never Smoker  . Smokeless tobacco: Never Used  Substance and Sexual Activity  . Alcohol use: Yes    Alcohol/week: 1.0 standard drinks    Types: 1 Glasses of wine per week    Comment: daily  . Drug use: No  . Sexual activity: Not on file  Lifestyle  . Physical activity:    Days per week: Not on file    Minutes per session: Not on file  . Stress: Not on file  Relationships  . Social connections:    Talks on phone: Not on file    Gets together: Not on file    Attends religious service: Not on file    Active member of club or organization: Not on file    Attends meetings of clubs or organizations:  Not on file    Relationship status: Not on file  . Intimate partner violence:    Fear of current or ex partner: Not on file    Emotionally abused: Not on file    Physically abused: Not on file    Forced sexual activity: Not on file  Other Topics Concern  . Not on file  Social History Narrative   Patient lives at home with her cat.    Patient is divorced.    Patient has 1 child.    Patient is retired.    Patient has a college education.      PHYSICAL EXAM  There were no vitals filed for this visit. There is no height or weight on file to calculate BMI.  Generalized: Well developed, Morbidly obese female in no acute distress  Head: normocephalic and atraumatic,. Oropharynx benign  Neck: Supple, no carotid bruits  Cardiac: Regular rate rhythm, no murmur  Musculoskeletal: No deformity  Skin 2+ edema below the knees bilaterally  Neurological examination   Mentation: Alert oriented to time, place, history taking. Attention span and concentration appropriate. Recent and remote memory intact.  Follows all commands speech and language fluent.   Cranial nerve II-XII: Pupils were equal round reactive to light extraocular movements were full, visual field were full on confrontational test. Facial sensation and strength were normal. hearing was intact to finger rubbing bilaterally. Uvula tongue midline. head turning and shoulder shrug were normal and symmetric.Tongue protrusion into cheek strength was normal. Motor: normal bulk and tone, full strength in the BUE, BLE, fine finger movements normal, no pronator drift.  Sensory: normal and symmetric to light touch, on the face arms and  legs Coordination: finger-nose-finger, heel-to-shin bilaterally, no dysmetria Reflexes: Depressed and symmetric, plantar responses were flexor bilaterally. Gait and Station: Rising up from seated position with push off, slightly wide based gait, ambulates with a walker with good stability. Tandem gait not  attempted  DIAGNOSTIC DATA (LABS, IMAGING, TESTING) - I reviewed patient records, labs, notes, testing and imaging myself where available.     ASSESSMENT AND PLAN  72 y.o. year old female  has a past medical history of Abnormality of gait (10/25/2012);  (11/18/2014); Peripheral neuropathy (HCC); Polyneuropathy in other diseases classified elsewhere (HCC) (10/25/2012); and Sleep apnea. And essential tremor here to follow-up.   PLAN: Continue Clonazepin, Lyrica and baclofen will refill Continue CPAP at 8 cm.  Call for any worsening of symptoms Follow up yearly Nilda Riggs, [facility], [facility], APRN  [facility] [address], [address] [place], [place] [address] [phone]

## 2017-12-13 ENCOUNTER — Ambulatory Visit: Payer: Self-pay | Admitting: Nurse Practitioner

## 2017-12-14 ENCOUNTER — Encounter: Payer: Self-pay | Admitting: Nurse Practitioner

## 2018-01-18 IMAGING — CT CT PELVIS W/O CM
2 of 3 series · 15 of 46 positions shown, 17 images · non-contrast
Comparison: Same day radiographs of the lumbar spine and hip

CLINICAL DATA: Acute tail bone pain after fall

EXAM:
CT PELVIS WITHOUT CONTRAST
TECHNIQUE: Multidetector CT imaging of the pelvis was performed following the
standard protocol without intravenous contrast.

[Series 4: pelvis 2.0 st · axial · 0.98mm/px · z∈[+774,+1026]mm · 12 of 146 slices shown, 14 images]
[im 10/146  soft-tissue]
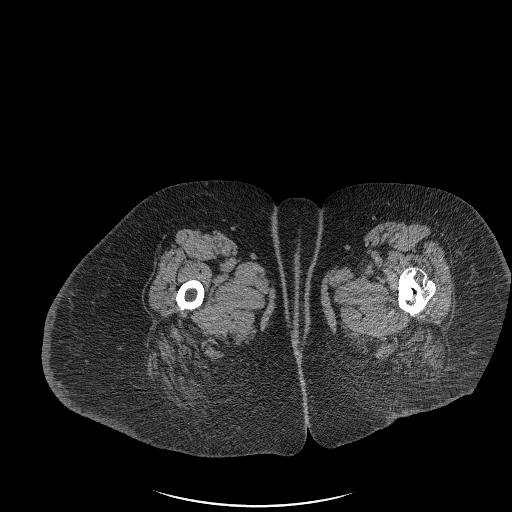
[im 10/146  bone]
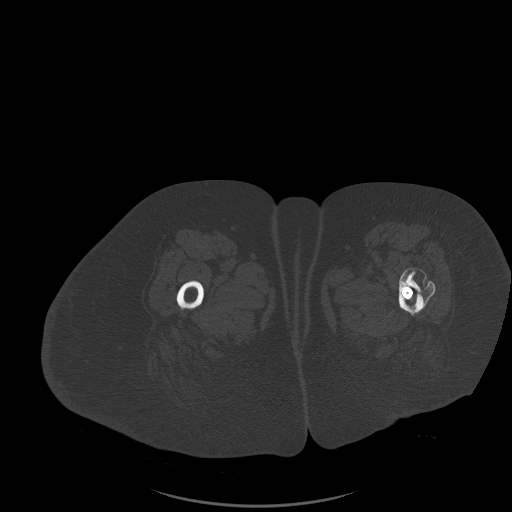
[im 19/146  soft-tissue]
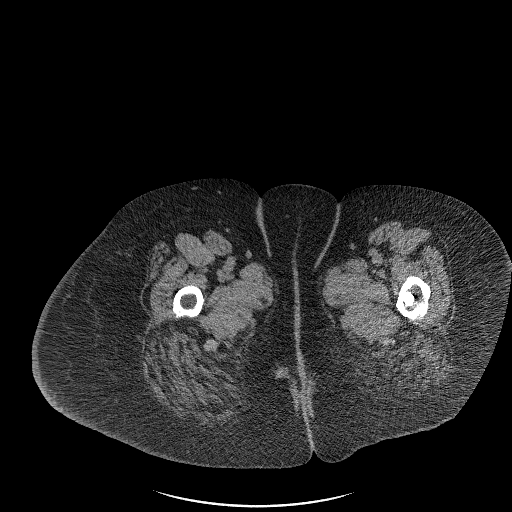
[im 33/146  soft-tissue]
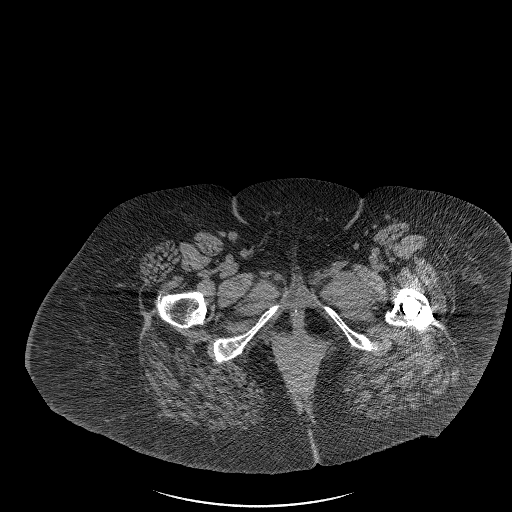
[im 43/146  soft-tissue]
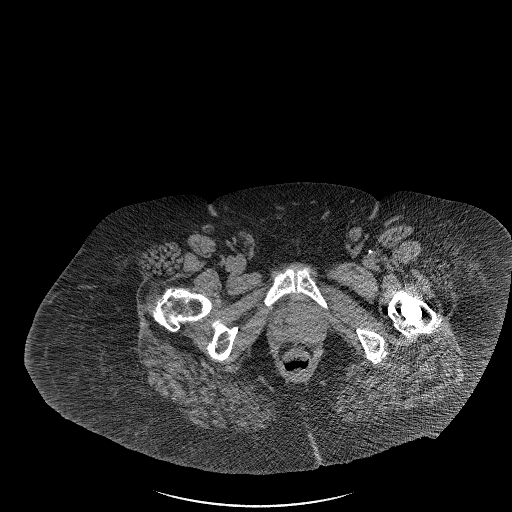
[im 57/146  soft-tissue]
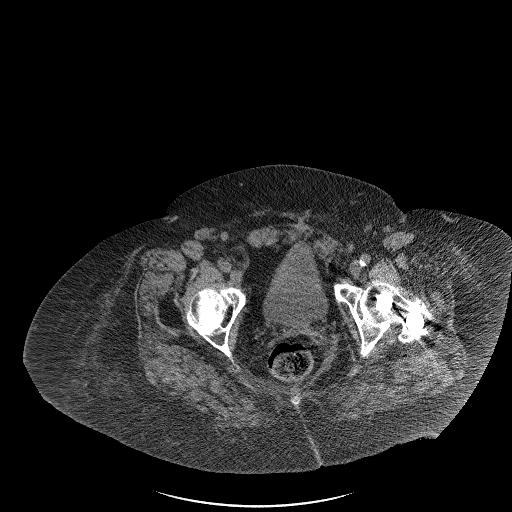
[im 66/146  soft-tissue]
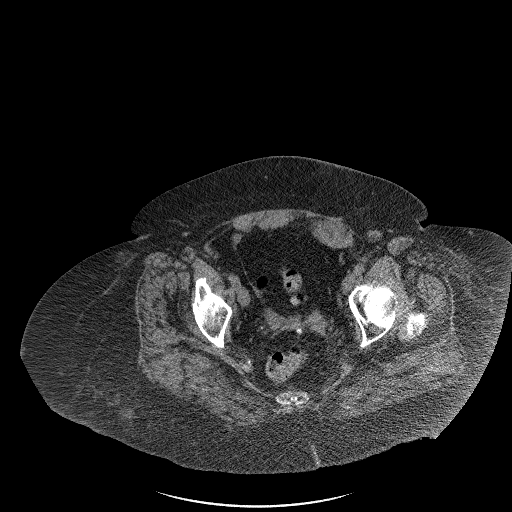
[im 80/146  soft-tissue]
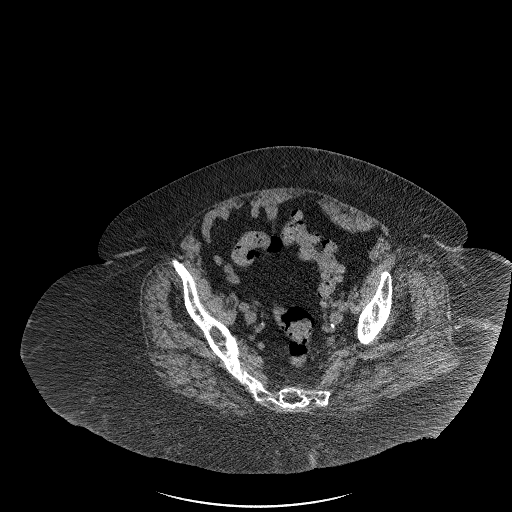
[im 89/146  soft-tissue]
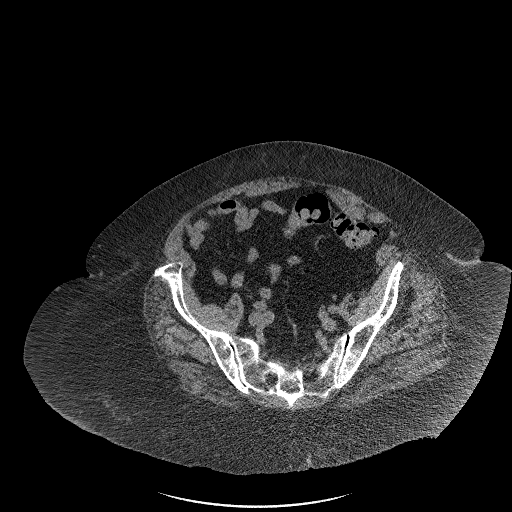
[im 103/146  soft-tissue]
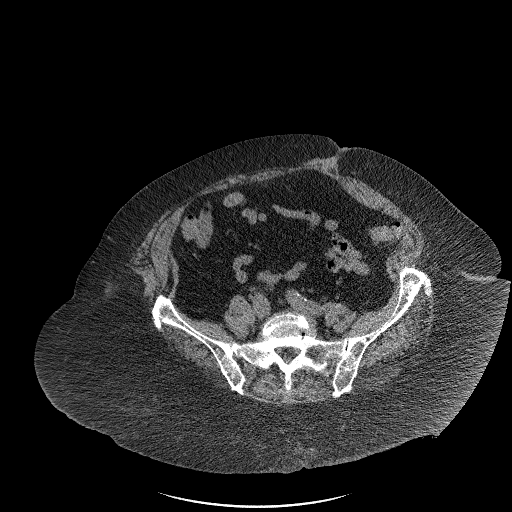
[im 103/146  bone]
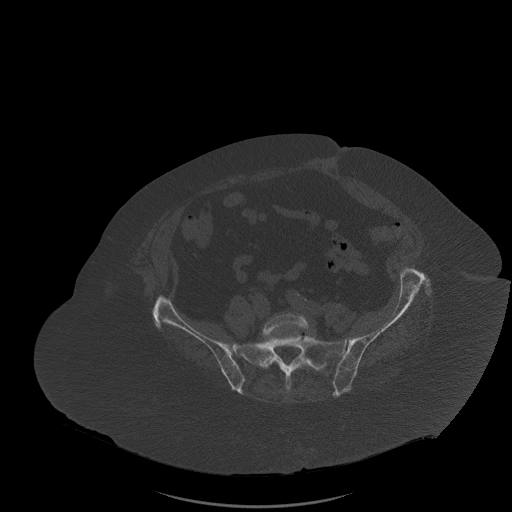
[im 113/146  soft-tissue]
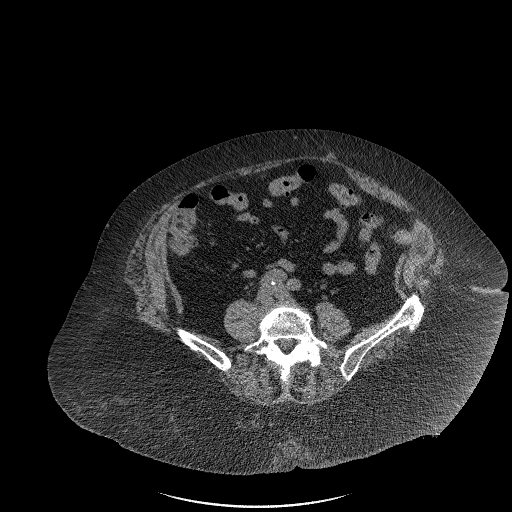
[im 127/146  soft-tissue]
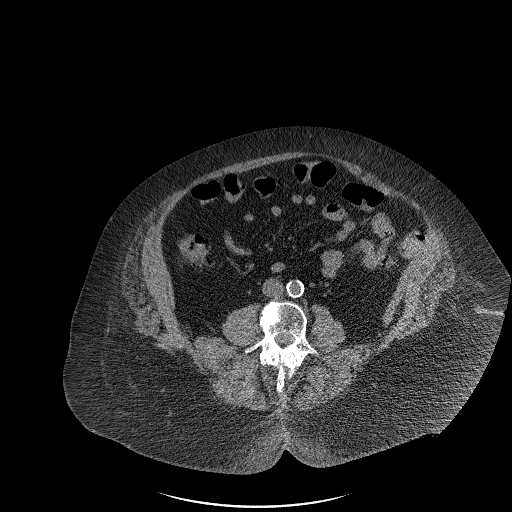
[im 136/146  soft-tissue]
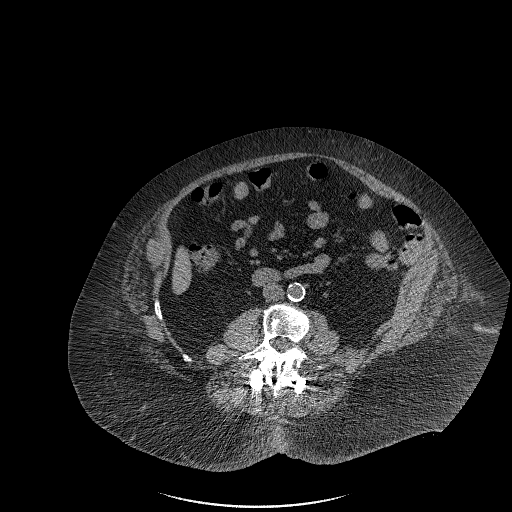

[Series 9: coronal st · coronal · 0.57mm/px · 3 of 191 slices shown]
[im 64/191  soft-tissue]
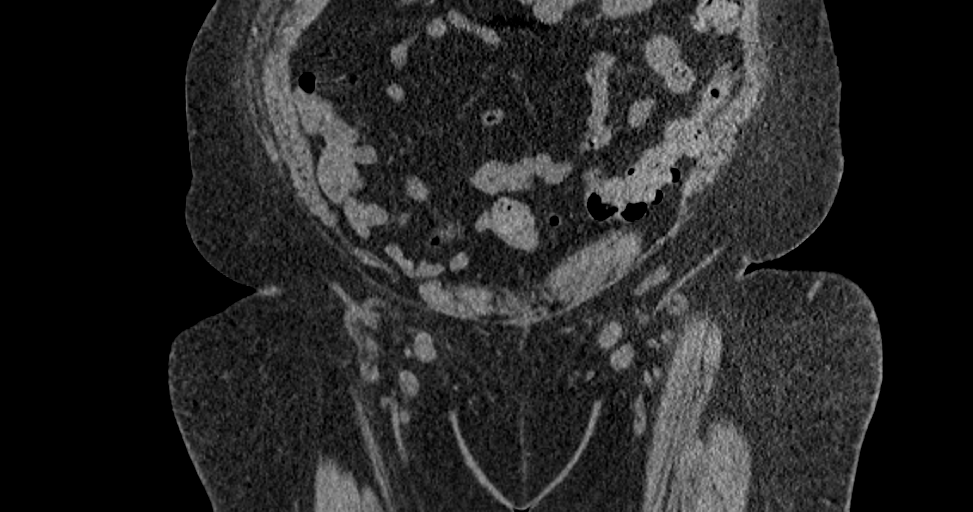
[im 85/191  soft-tissue]
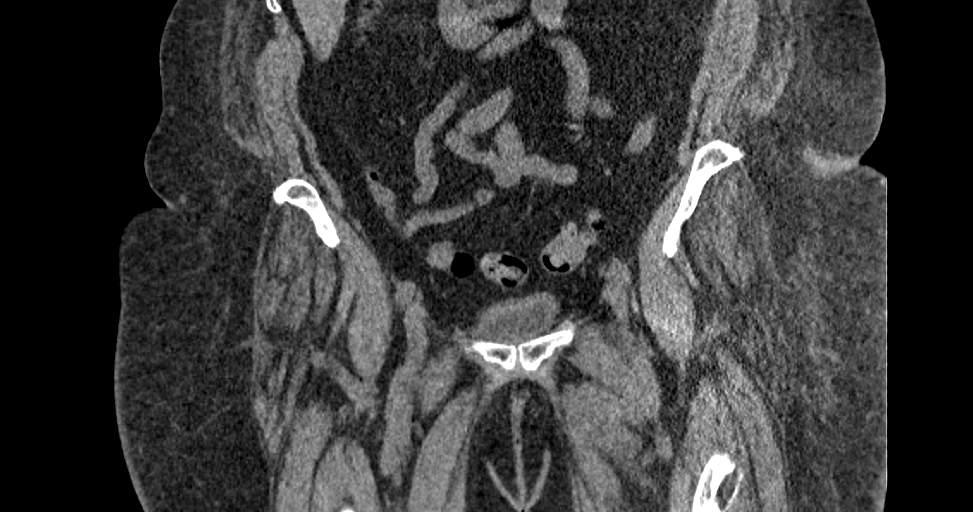
[im 106/191  soft-tissue]
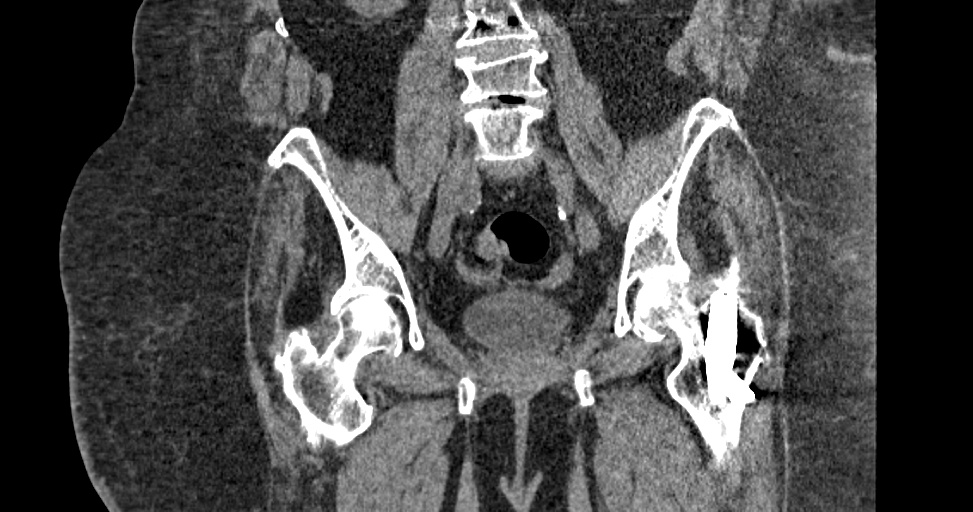

[15 of 46 positions shown; findings below may reference images not displayed]

FINDINGS: Urinary Tract: Unremarkable appearance of the bladder. No distal
uropathy.

Bowel: Scattered colonic diverticulosis along the sigmoid colon
without bowel obstruction or diverticulitis.

Vascular/Lymphatic: Aortoiliac atherosclerosis without aneurysm. No
adenopathy.

Reproductive:  Hysterectomy.  No adnexal mass.

Other:  None

Musculoskeletal: Partially visualized lumbar spinal fusion hardware
at L3 with anterolisthesis grade 1 of L4 on L5 associated with
degenerative disc disease. Partially calcified disc noted at L5-S1.
Lumbar degenerative facet arthropathy from L3 through S1. No acute
fracture identified of the sacrum or coccyx. Posterior soft tissue
swelling consistent with a soft tissue contusion overlying the
lumbosacral junction.
IMPRESSION: 1. Soft tissue contusion at the lumbosacral juncture posteriorly.
2. No acute fracture of the sacrum or coccyx.
3. Lumbar spinal fusion hardware partially imaged at L3 without
acute hardware failure.
4. Lower lumbar degenerative disc and facet arthropathy from L4
through S1.
